# Patient Record
Sex: Female | Born: 1965
Health system: Southern US, Community
[De-identification: ages and names within clinical notes are randomized; demographics above are authoritative.]

## PROBLEM LIST (undated history)

## (undated) DIAGNOSIS — R51 Headache: Secondary | ICD-10-CM

## (undated) DIAGNOSIS — G4730 Sleep apnea, unspecified: Secondary | ICD-10-CM

## (undated) DIAGNOSIS — T7500XA Unspecified effects of lightning, initial encounter: Secondary | ICD-10-CM

## (undated) DIAGNOSIS — M1990 Unspecified osteoarthritis, unspecified site: Secondary | ICD-10-CM

## (undated) DIAGNOSIS — I10 Essential (primary) hypertension: Secondary | ICD-10-CM

## (undated) DIAGNOSIS — R519 Headache, unspecified: Secondary | ICD-10-CM

## (undated) DIAGNOSIS — R112 Nausea with vomiting, unspecified: Secondary | ICD-10-CM

## (undated) DIAGNOSIS — E785 Hyperlipidemia, unspecified: Secondary | ICD-10-CM

## (undated) DIAGNOSIS — E042 Nontoxic multinodular goiter: Secondary | ICD-10-CM

## (undated) DIAGNOSIS — F32A Depression, unspecified: Secondary | ICD-10-CM

## (undated) DIAGNOSIS — H539 Unspecified visual disturbance: Secondary | ICD-10-CM

## (undated) DIAGNOSIS — Z9889 Other specified postprocedural states: Secondary | ICD-10-CM

## (undated) DIAGNOSIS — E669 Obesity, unspecified: Secondary | ICD-10-CM

## (undated) DIAGNOSIS — R002 Palpitations: Secondary | ICD-10-CM

## (undated) DIAGNOSIS — I499 Cardiac arrhythmia, unspecified: Secondary | ICD-10-CM

## (undated) DIAGNOSIS — F329 Major depressive disorder, single episode, unspecified: Secondary | ICD-10-CM

## (undated) DIAGNOSIS — E119 Type 2 diabetes mellitus without complications: Secondary | ICD-10-CM

## (undated) DIAGNOSIS — D179 Benign lipomatous neoplasm, unspecified: Secondary | ICD-10-CM

## (undated) DIAGNOSIS — G510 Bell's palsy: Secondary | ICD-10-CM

## (undated) DIAGNOSIS — I4891 Unspecified atrial fibrillation: Secondary | ICD-10-CM

## (undated) HISTORY — PX: ABDOMINAL HYSTERECTOMY: SHX81

## (undated) HISTORY — DX: Unspecified atrial fibrillation: I48.91

## (undated) HISTORY — DX: Type 2 diabetes mellitus without complications: E11.9

## (undated) HISTORY — DX: Depression, unspecified: F32.A

## (undated) HISTORY — DX: Headache: R51

## (undated) HISTORY — DX: Hyperlipidemia, unspecified: E78.5

## (undated) HISTORY — DX: Unspecified visual disturbance: H53.9

## (undated) HISTORY — DX: Major depressive disorder, single episode, unspecified: F32.9

## (undated) HISTORY — DX: Unspecified osteoarthritis, unspecified site: M19.90

## (undated) HISTORY — DX: Palpitations: R00.2

## (undated) HISTORY — DX: Obesity, unspecified: E66.9

## (undated) HISTORY — DX: Essential (primary) hypertension: I10

## (undated) HISTORY — DX: Headache, unspecified: R51.9

## (undated) HISTORY — DX: Nontoxic multinodular goiter: E04.2

---

## 2001-05-30 ENCOUNTER — Encounter: Admission: RE | Admit: 2001-05-30 | Discharge: 2001-05-30 | Payer: Self-pay | Admitting: Obstetrics and Gynecology

## 2001-05-30 ENCOUNTER — Encounter: Payer: Self-pay | Admitting: Obstetrics and Gynecology

## 2001-08-08 ENCOUNTER — Other Ambulatory Visit: Admission: RE | Admit: 2001-08-08 | Discharge: 2001-08-08 | Payer: Self-pay | Admitting: Obstetrics & Gynecology

## 2002-08-29 ENCOUNTER — Other Ambulatory Visit: Admission: RE | Admit: 2002-08-29 | Discharge: 2002-08-29 | Payer: Self-pay | Admitting: Obstetrics & Gynecology

## 2003-12-07 ENCOUNTER — Ambulatory Visit (HOSPITAL_COMMUNITY): Admission: RE | Admit: 2003-12-07 | Discharge: 2003-12-07 | Payer: Self-pay | Admitting: Obstetrics & Gynecology

## 2004-12-09 ENCOUNTER — Ambulatory Visit (HOSPITAL_COMMUNITY): Admission: RE | Admit: 2004-12-09 | Discharge: 2004-12-09 | Payer: Self-pay | Admitting: Obstetrics & Gynecology

## 2005-01-14 ENCOUNTER — Emergency Department (HOSPITAL_COMMUNITY): Admission: EM | Admit: 2005-01-14 | Discharge: 2005-01-15 | Payer: Self-pay | Admitting: Emergency Medicine

## 2005-06-16 ENCOUNTER — Ambulatory Visit (HOSPITAL_COMMUNITY): Admission: RE | Admit: 2005-06-16 | Discharge: 2005-06-16 | Payer: Self-pay | Admitting: *Deleted

## 2005-08-29 ENCOUNTER — Ambulatory Visit: Payer: Self-pay | Admitting: Internal Medicine

## 2005-12-11 ENCOUNTER — Ambulatory Visit (HOSPITAL_COMMUNITY): Admission: RE | Admit: 2005-12-11 | Discharge: 2005-12-11 | Payer: Self-pay | Admitting: Obstetrics & Gynecology

## 2006-11-08 ENCOUNTER — Ambulatory Visit (HOSPITAL_COMMUNITY): Admission: RE | Admit: 2006-11-08 | Discharge: 2006-11-08 | Payer: Self-pay | Admitting: Obstetrics & Gynecology

## 2006-11-15 ENCOUNTER — Ambulatory Visit (HOSPITAL_COMMUNITY): Admission: RE | Admit: 2006-11-15 | Discharge: 2006-11-15 | Payer: Self-pay | Admitting: General Surgery

## 2006-11-15 ENCOUNTER — Encounter (INDEPENDENT_AMBULATORY_CARE_PROVIDER_SITE_OTHER): Payer: Self-pay | Admitting: Interventional Radiology

## 2007-11-11 ENCOUNTER — Ambulatory Visit (HOSPITAL_COMMUNITY): Admission: RE | Admit: 2007-11-11 | Discharge: 2007-11-11 | Payer: Self-pay | Admitting: Obstetrics & Gynecology

## 2007-11-15 ENCOUNTER — Encounter: Admission: RE | Admit: 2007-11-15 | Discharge: 2007-11-15 | Payer: Self-pay | Admitting: Obstetrics & Gynecology

## 2008-08-20 HISTORY — PX: APPENDECTOMY: SHX54

## 2008-08-21 ENCOUNTER — Observation Stay (HOSPITAL_COMMUNITY): Admission: EM | Admit: 2008-08-21 | Discharge: 2008-08-22 | Payer: Self-pay | Admitting: Emergency Medicine

## 2008-08-21 ENCOUNTER — Encounter: Admission: RE | Admit: 2008-08-21 | Discharge: 2008-08-21 | Payer: Self-pay | Admitting: Family Medicine

## 2008-08-21 ENCOUNTER — Encounter (INDEPENDENT_AMBULATORY_CARE_PROVIDER_SITE_OTHER): Payer: Self-pay | Admitting: Surgery

## 2008-11-16 ENCOUNTER — Ambulatory Visit (HOSPITAL_COMMUNITY): Admission: RE | Admit: 2008-11-16 | Discharge: 2008-11-16 | Payer: Self-pay | Admitting: Obstetrics & Gynecology

## 2009-09-09 ENCOUNTER — Encounter: Admission: RE | Admit: 2009-09-09 | Discharge: 2009-09-09 | Payer: Self-pay | Admitting: General Surgery

## 2009-12-17 ENCOUNTER — Ambulatory Visit (HOSPITAL_BASED_OUTPATIENT_CLINIC_OR_DEPARTMENT_OTHER): Admission: RE | Admit: 2009-12-17 | Discharge: 2009-12-17 | Payer: Self-pay | Admitting: Obstetrics & Gynecology

## 2009-12-17 ENCOUNTER — Ambulatory Visit: Payer: Self-pay | Admitting: Diagnostic Radiology

## 2010-03-13 ENCOUNTER — Encounter: Payer: Self-pay | Admitting: *Deleted

## 2010-03-14 ENCOUNTER — Encounter: Payer: Self-pay | Admitting: General Surgery

## 2010-07-01 ENCOUNTER — Encounter (INDEPENDENT_AMBULATORY_CARE_PROVIDER_SITE_OTHER): Payer: Self-pay | Admitting: General Surgery

## 2010-07-05 NOTE — Op Note (Signed)
NAMEBRAYAH, [name]:  [id number]   MEDICAL RECORD NO.:  [id number]          PATIENT TYPE:  OBV   LOCATION:  0098                         FACILITY:  [facility]   PHYSICIAN:  Wilmon Arms. Corliss Skains, M.D. DATE OF BIRTH:  [birth year]   DATE OF PROCEDURE:  08/21/2008  DATE OF DISCHARGE:                               OPERATIVE REPORT   PREOPERATIVE DIAGNOSIS:  Acute appendicitis.   POSTOPERATIVE DIAGNOSIS:  Acute appendicitis.   PROCEDURE PERFORMED:  Laparoscopic appendectomy.   SURGEON:  Wilmon Arms. Tsuei, M.D.   ANESTHESIA:  General   INDICATIONS:  This is a 45 year old female who presents with a 12-hour  history of lower abdominal pain localized her right lower quadrant.  White count was obtained and was elevated 17.  CT scan showed acute  appendicitis.  No sign of perforation.  She presented to the emergency  department for evaluation.  We recommended immediate appendectomy.   DESCRIPTION OF PROCEDURE:  The patient brought to the operating room,  placed supine position on operating table.  After an adequate level of  general anesthesia was obtained, a Foley catheter was placed under  sterile technique.  The patient's abdomen was prepped with Betadine and  draped in sterile fashion.  A time-out was taken assure proper patient,  proper procedure.  We infiltrated the area just above her umbilicus with  0.25% Marcaine with epinephrine.  We made a transverse incision, and  dissection was carried down to the fascia.  The fascia was opened  vertically, and we entered the peritoneal cavity bluntly.  A stay suture  of 0 Vicryl was placed around the fascial opening.  Pneumoperitoneum was  obtained by insufflating CO2 maintaining maximal pressure of 15 mmHg.  We inserted a laparoscope and did not visualize any free fluid or  purulence.  There were some adhesions in the right upper quadrant.  We  placed a 5-mm port right upper quadrant of the 5 mm portal left lower  quadrant.   Scissors were used take down some adhesions in the right  upper quadrant.  We switched to a 5 mm 30 degree laparoscope and moved  into the right upper quadrant port site.  We positioned the patient in  Trendelenburg tilted to her left.  The cecum was mobilized medially, and  we identified an inflamed nonperforated appendix.  We bluntly dissected  away some of the surrounding inflammatory adhesions.  The mesoappendix  was then divided with the harmonic scalpel.  Once we had dissected all  the way down to the base of the appendix at the cecum, a Endo-GIA  stapler was used to divide the base of appendix.  The staple line was  intact with no sign of bleeding or leak.  The appendix was placed in  EndoCatch sac.  We thoroughly irrigated the right lower quadrant and the  pelvis with saline.  Once we satisfied that hemostasis, we removed the  appendix through the umbilical port site.  The fascia was closed with  the pursestring  suture.  We inspected one last time for hemostasis and then released  pneumoperitoneum as we removed the trocars.  4-0 Monocryl was use close  skin incisions.  Steri-Strips and clean dressings were applied.  The  patient was then extubated, brought to recovery in stable condition.  All sponge, instrument, needle counts were correct.      Wilmon Arms. Tsuei, M.D.  Electronically Signed     MKT/MEDQ  D:  08/21/2008  T:  08/22/2008  Job:  [id number]

## 2010-10-12 ENCOUNTER — Encounter (INDEPENDENT_AMBULATORY_CARE_PROVIDER_SITE_OTHER): Payer: Self-pay | Admitting: General Surgery

## 2010-10-19 ENCOUNTER — Other Ambulatory Visit (HOSPITAL_BASED_OUTPATIENT_CLINIC_OR_DEPARTMENT_OTHER): Payer: Self-pay | Admitting: Obstetrics & Gynecology

## 2010-10-19 DIAGNOSIS — Z1231 Encounter for screening mammogram for malignant neoplasm of breast: Secondary | ICD-10-CM

## 2010-11-17 ENCOUNTER — Telehealth (INDEPENDENT_AMBULATORY_CARE_PROVIDER_SITE_OTHER): Payer: Self-pay | Admitting: General Surgery

## 2010-11-17 NOTE — Telephone Encounter (Signed)
Pt called wanted to confirm if she needed to have an ultrasound of throat done before she makes appt to see Dr. Derrell Lolling. Pt indicated she is LTF and due for appt soon. Has been more than a year since she has been seen by him.

## 2010-11-29 ENCOUNTER — Other Ambulatory Visit (INDEPENDENT_AMBULATORY_CARE_PROVIDER_SITE_OTHER): Payer: Self-pay | Admitting: General Surgery

## 2010-11-29 ENCOUNTER — Telehealth (INDEPENDENT_AMBULATORY_CARE_PROVIDER_SITE_OTHER): Payer: Self-pay | Admitting: General Surgery

## 2010-11-29 DIAGNOSIS — E041 Nontoxic single thyroid nodule: Secondary | ICD-10-CM

## 2010-12-01 ENCOUNTER — Other Ambulatory Visit: Payer: Self-pay

## 2010-12-19 ENCOUNTER — Ambulatory Visit (HOSPITAL_BASED_OUTPATIENT_CLINIC_OR_DEPARTMENT_OTHER)
Admission: RE | Admit: 2010-12-19 | Discharge: 2010-12-19 | Disposition: A | Discharge status: Home / Self Care | Payer: 59 | Source: Ambulatory Visit | Attending: Obstetrics & Gynecology | Admitting: Obstetrics & Gynecology

## 2010-12-19 DIAGNOSIS — Z1231 Encounter for screening mammogram for malignant neoplasm of breast: Secondary | ICD-10-CM | POA: Insufficient documentation

## 2010-12-22 ENCOUNTER — Ambulatory Visit
Admission: RE | Admit: 2010-12-22 | Discharge: 2010-12-22 | Disposition: A | Discharge status: Home / Self Care | Payer: 59 | Source: Ambulatory Visit | Attending: General Surgery | Admitting: General Surgery

## 2010-12-22 DIAGNOSIS — E041 Nontoxic single thyroid nodule: Secondary | ICD-10-CM

## 2011-02-09 ENCOUNTER — Ambulatory Visit (INDEPENDENT_AMBULATORY_CARE_PROVIDER_SITE_OTHER): Payer: 59 | Admitting: General Surgery

## 2011-02-09 ENCOUNTER — Encounter (INDEPENDENT_AMBULATORY_CARE_PROVIDER_SITE_OTHER): Payer: Self-pay | Admitting: General Surgery

## 2011-02-09 VITALS — BP 128/80 | HR 66 | Temp 97.3°F | Resp 18 | Ht 64.0 in | Wt 228.0 lb

## 2011-02-09 DIAGNOSIS — E042 Nontoxic multinodular goiter: Secondary | ICD-10-CM

## 2011-02-09 NOTE — Progress Notes (Signed)
Patient ID: Janet [name], female   DOB: [birth year], 45 y.o.   MRN: [id number]  Chief Complaint  Patient presents with  . Follow-up    Thyroid ultrasound - one year follow up    HPI Janet [name] is a 45 y.o. female.  She is followed annually for a multinodular goiter.. Her primary care physician is Dr. Gertie Gowda in [place].  This patient has been followed since 2008 for multinodular goiter. She was  initially evaluated because she felt some pressure and discomfort in her left neck. She states that has all changed and that she has no neck symptoms whatsoever. No swallowing problems. No voice change.  We did fine needle aspiration on her thyroid nodules in 2008 which were consistent with benign nonneoplastic goiter.  She has no risk factors. No family history and a history of radiation therapy.  Followup ultrasound of the neck was performed on December 22, 2010. This shows multiple bilateral thyroid nodules. There is marginal interval enlargement of the bilateral lower pole dominant nodules. The biggest nodule on the inferior right a 16 x 20 mm. The biggest nodule on the left is 14 x 21 mm. Each of these is about 2 mm larger. HPI  Past Medical History  Diagnosis Date  . Hypertension   . Arthritis     Past Surgical History  Procedure Date  . Appendectomy 7/10    Family History  Problem Relation Age of Onset  . Diabetes Mother   . Hypertension Father   . Hypertension Brother     Social History History  Substance Use Topics  . Smoking status: Never Smoker   . Smokeless tobacco: Never Used  . Alcohol Use: No    No Known Allergies  Current Outpatient Prescriptions  Medication Sig Dispense Refill  . VITAMIN E PO Take by mouth daily.        . BuPROPion HCl (WELLBUTRIN PO) Take by mouth.        . Cholecalciferol (VITAMIN D) 2000 UNITS CAPS Take by mouth.        Marland Kitchen lisinopril (PRINIVIL,ZESTRIL) 10 MG tablet Take 10 mg by mouth daily.        . Omega-3 Fatty Acids  (FISH OIL PO) Take by mouth.          Review of Systems Review of Systems  Constitutional: Negative for fever, chills and unexpected weight change.  HENT: Negative for hearing loss, congestion, sore throat, trouble swallowing and voice change.   Eyes: Negative for visual disturbance.  Respiratory: Negative for cough and wheezing.   Cardiovascular: Negative for chest pain, palpitations and leg swelling.  Gastrointestinal: Negative for nausea, vomiting, abdominal pain, diarrhea, constipation, blood in stool, abdominal distention and anal bleeding.  Genitourinary: Negative for hematuria, vaginal bleeding and difficulty urinating.  Musculoskeletal: Negative for arthralgias.  Skin: Negative for rash and wound.  Neurological: Negative for seizures, syncope and headaches.  Hematological: Negative for adenopathy. Does not bruise/bleed easily.  Psychiatric/Behavioral: Negative for confusion.    Blood pressure 128/80, pulse 66, temperature 97.3 F (36.3 C), temperature source Temporal, resp. rate 18, height 5\' 4"  (1.626 m), weight 228 lb (103.42 kg).  Physical Exam Physical Exam  Constitutional: She appears well-developed and well-nourished. No distress.  HENT:  Head: Normocephalic and atraumatic.  Eyes: Conjunctivae and EOM are normal. Pupils are equal, round, and reactive to light. Left eye exhibits no discharge. No scleral icterus.  Neck: Normal range of motion. Neck supple. No JVD present. No tracheal deviation present. No thyromegaly  present.  Cardiovascular: Normal rate and regular rhythm.   Lymphadenopathy:    She has no cervical adenopathy.  Skin: Skin is warm and dry. No rash noted. She is not diaphoretic. No erythema. No pallor.  Psychiatric: She has a normal mood and affect. Her behavior is normal. Judgment and thought content normal.    Data Reviewed I have reviewed her old chart and her recent ultrasound  Assessment    Benign, nontoxic multinodular goiter. Essentially  stable for 4 years.  Risk of malignancy is very low. There is no indication for surgical intervention.   Plan    The main issue is the frequency of followup. I advised her to followup with her primary care physician.  I told her that she should get a thyroid ultrasound every 2 or 3 years.  Consideration should be given to an endocrinologist consult, although that is not mandatory.  Return to see me p.r.n. further problems       Tawn Fitzner M 02/09/2011, 8:48 AM

## 2011-02-09 NOTE — Patient Instructions (Signed)
I think that you have a benign, nontoxic multinodular goiter. We have followed this for 4 years  and there has been almost no change, only slight enlargement of some of themany nodules. I do not think that this requires surgical intervention. The risk of cancer is very low.  I advise you to followup with your primary care physician. I advise you to get an ultrasound every 2 or 3 years. Discuss with your primary care physician about the possibility of a consultation by an endocrinologist. See me if any new problems arise.

## 2011-02-15 NOTE — Telephone Encounter (Signed)
Results given.

## 2011-04-06 ENCOUNTER — Ambulatory Visit (INDEPENDENT_AMBULATORY_CARE_PROVIDER_SITE_OTHER): Payer: 59 | Admitting: Internal Medicine

## 2011-04-06 ENCOUNTER — Telehealth: Payer: Self-pay

## 2011-04-06 ENCOUNTER — Encounter: Payer: Self-pay | Admitting: Internal Medicine

## 2011-04-06 VITALS — BP 145/88 | HR 86 | Temp 99.2°F | Ht 64.0 in | Wt 229.1 lb

## 2011-04-06 DIAGNOSIS — E785 Hyperlipidemia, unspecified: Secondary | ICD-10-CM | POA: Insufficient documentation

## 2011-04-06 DIAGNOSIS — E669 Obesity, unspecified: Secondary | ICD-10-CM | POA: Insufficient documentation

## 2011-04-06 DIAGNOSIS — R0789 Other chest pain: Secondary | ICD-10-CM

## 2011-04-06 DIAGNOSIS — I10 Essential (primary) hypertension: Secondary | ICD-10-CM | POA: Insufficient documentation

## 2011-04-06 DIAGNOSIS — K649 Unspecified hemorrhoids: Secondary | ICD-10-CM | POA: Insufficient documentation

## 2011-04-06 DIAGNOSIS — F329 Major depressive disorder, single episode, unspecified: Secondary | ICD-10-CM | POA: Insufficient documentation

## 2011-04-06 DIAGNOSIS — E042 Nontoxic multinodular goiter: Secondary | ICD-10-CM

## 2011-04-06 LAB — CBC WITH DIFFERENTIAL/PLATELET
Eosinophils Absolute: 0.1 10*3/uL (ref 0.0–0.7)
Eosinophils Relative: 2 % (ref 0–5)
HCT: 38.4 % (ref 36.0–46.0)
Hemoglobin: 12.4 g/dL (ref 12.0–15.0)
Lymphs Abs: 2 10*3/uL (ref 0.7–4.0)
MCH: 28.6 pg (ref 26.0–34.0)
MCV: 88.5 fL (ref 78.0–100.0)
Monocytes Absolute: 0.6 10*3/uL (ref 0.1–1.0)
Monocytes Relative: 8 % (ref 3–12)
RBC: 4.34 MIL/uL (ref 3.87–5.11)

## 2011-04-06 LAB — COMPREHENSIVE METABOLIC PANEL
Alkaline Phosphatase: 108 U/L (ref 39–117)
Glucose, Bld: 81 mg/dL (ref 70–99)
Sodium: 140 mEq/L (ref 135–145)
Total Bilirubin: 0.2 mg/dL — ABNORMAL LOW (ref 0.3–1.2)
Total Protein: 6.8 g/dL (ref 6.0–8.3)

## 2011-04-06 LAB — LIPID PANEL
Cholesterol: 211 mg/dL — ABNORMAL HIGH (ref 0–200)
LDL Cholesterol: 119 mg/dL — ABNORMAL HIGH (ref 0–99)
Total CHOL/HDL Ratio: 4.1 Ratio
VLDL: 40 mg/dL (ref 0–40)

## 2011-04-06 LAB — TSH: TSH: 0.808 u[IU]/mL (ref 0.350–4.500)

## 2011-04-06 NOTE — Telephone Encounter (Signed)
.  [facility] FROM [facility] STATES THEY ONLY RECEIVED THE EKG ON PT AND THEY NEED THE REST OF THE RECORDS PLEASE FAX TO [phone] AND THE PHONE NUMBER IS [phone]

## 2011-04-06 NOTE — Telephone Encounter (Signed)
This is not our patient. [facility] notified.

## 2011-04-06 NOTE — Patient Instructions (Addendum)
Will refer to cardiology  Dr. Nanetta Batty  Take 81 mg coated ASA every other day  Make appt with therapist Jeannie Done  See me 6 weeks

## 2011-04-06 NOTE — Progress Notes (Signed)
Subjective:    Patient ID: Janet [name], female    DOB: [birth year], 46 y.o.   MRN: [id number]  HPI  New pt here for first visit.  Former care at [facility].  PMH of HTN, obesity, MNG with benign Bx 2008, hyperlipidemia, Bell's palsy,  Adj d/o with depression and hemorrhoids.    Janet [name] is concerned over persistent chest pressure/tightness off and on for several months.  She recently went to cardiac risk screening at [facility] where EKG showed 2 unifocal PVC's and she was advised not to do any exercise or anything exertional. She is trying to lose weight and is not sure if it is safe to exercise now.  She denies SOB, N/V diaphoresis during chest pressure but does note that when she was trying to do push ups, she had L arm pain .  No pain going through to back or indigestion.  Risk factors, HTN, mild hyperlipidemia, obesity,  FH HTN and CVA in grandfather.  No MI that she is aware.    She is under a lot of emotional stress.  Husband left 7 months ago, one teenage daughter is at [employer] in [place] and second daughter at home.  Both girls have not been doing well since father came home over new years.  Grades falling, daughters never been in counseling. Pt was in therapy years ago but not currently. She is on Wellbutrin which helps but she really does not want to add another med now.  She does cry when alone but denies suicidal ideation.    No Known Allergies Past Medical History  Diagnosis Date  . Arthritis   . Depression   . Hypertension   . Hyperlipidemia   . Goiter, nontoxic, multinodular   . Obesity   . Hemorrhoids    Past Surgical History  Procedure Date  . Appendectomy 7/10  . Cesarean section    History   Social History  . Marital Status: Legally Separated    Spouse Name: N/A    Number of Children: N/A  . Years of Education: N/A   Occupational History  . Not on file.   Social History Main Topics  . Smoking status: Never Smoker   . Smokeless tobacco: Never Used  .  Alcohol Use: No  . Drug Use: No  . Sexually Active: Not Currently   Other Topics Concern  . Not on file   Social History Narrative  . No narrative on file   Family History  Problem Relation Age of Onset  . Diabetes Mother   . Hypertension Father   . Hypertension Brother   . Asthma Sister   . COPD Maternal Grandmother   . Diabetes Paternal Grandmother    Patient Active Problem List  Diagnoses  . Depression  . Hypertension  . Hyperlipidemia  . Goiter, nontoxic, multinodular  . Obesity  . Hemorrhoids   No current outpatient prescriptions on file prior to visit.       Review of Systems See HPI    Objective:   Physical Exam Physical Exam  Nursing note and vitals reviewed.  Constitutional: She is oriented to person, place, and time. She appears well-developed and well-nourished.  HENT:  Head: Normocephalic and atraumatic.  Cardiovascular: Normal rate and regular rhythm. Exam reveals no gallop and no friction rub.  No murmur heard.  Pulmonary/Chest: Breath sounds normal. She has no wheezes. She has no rales.  Neurological: She is alert and oriented to person, place, and time.  Skin: Skin is warm and  dry.  Psychiatric: She has a normal mood and affect. Her behavior is normal.         Assessment & Plan:  1)  Persistant chest tightness:  Symptoms somewhat atypical but several risk factors and she would like exercise risk stratification prior to starting an exercise program.  Will refer to Dr. Allyson Sabal.  ADvised for now to take a coated baby ASA qod as she reports when she took one daily she had increased hemorrhoidal bleeding.  She was given education on heart disease in women. 2)  HTN 3)  Hyperlipidemia  Mild on lipid screen from [facility] will recheck today 4) Obesity 5)  Grief ADj/disorder with depressive features:  Pt given number to [facility] therapist and advised to make appt.  Check labs today   See me in 6 weeks

## 2011-04-10 ENCOUNTER — Encounter: Payer: Self-pay | Admitting: Emergency Medicine

## 2011-04-12 ENCOUNTER — Encounter: Payer: Self-pay | Admitting: Internal Medicine

## 2011-04-18 ENCOUNTER — Ambulatory Visit: Payer: 59 | Admitting: Internal Medicine

## 2011-05-04 ENCOUNTER — Encounter: Payer: Self-pay | Admitting: Internal Medicine

## 2011-05-04 DIAGNOSIS — R079 Chest pain, unspecified: Secondary | ICD-10-CM | POA: Insufficient documentation

## 2011-05-09 ENCOUNTER — Ambulatory Visit: Payer: 59 | Admitting: Internal Medicine

## 2011-05-11 ENCOUNTER — Ambulatory Visit: Payer: 59 | Admitting: Internal Medicine

## 2011-05-16 ENCOUNTER — Ambulatory Visit: Payer: 59 | Admitting: Internal Medicine

## 2011-05-16 ENCOUNTER — Encounter: Payer: Self-pay | Admitting: Internal Medicine

## 2011-05-18 ENCOUNTER — Ambulatory Visit (INDEPENDENT_AMBULATORY_CARE_PROVIDER_SITE_OTHER): Payer: 59 | Admitting: Internal Medicine

## 2011-05-18 VITALS — BP 112/73 | HR 90 | Temp 98.0°F | Resp 12 | Ht 64.0 in | Wt 222.1 lb

## 2011-05-18 DIAGNOSIS — R0789 Other chest pain: Secondary | ICD-10-CM

## 2011-05-18 DIAGNOSIS — I10 Essential (primary) hypertension: Secondary | ICD-10-CM

## 2011-05-18 DIAGNOSIS — E042 Nontoxic multinodular goiter: Secondary | ICD-10-CM

## 2011-05-18 MED ORDER — CEPHALEXIN 500 MG PO CAPS: 500.0000 mg | ORAL_CAPSULE | Freq: Two times a day (BID) | ORAL | Status: AC

## 2011-05-18 NOTE — Patient Instructions (Addendum)
Therapist options:   Eudelia Bunch  :  [facility] [phone]                                  Vonna Kotyk:  Phone  [phone]  Will refer to Dr. Talmage Nap  Endocrine specialist  Schedule complete physical exam with me

## 2011-05-18 NOTE — Progress Notes (Signed)
Subjective:    Patient ID: Janet [name], female    DOB: Jul 04, 1965, 46 y.o.   MRN: [id number]  HPI  Janet [name] is here for follow up.  No further chest tightness.  She is off Ace Inhibitor and now on Beta blocker and states she feels better on B blocker.  She did see Dr. [name] see scanned note.    She has not been able to see a therapist as yet. Therapist not in her network.  She has good days and bad days   She states she feels a Pustule in vaginal areas.  She has had these in the past and tries to "pop" area with a pin.  She has been trying to squeeze area  She has never had endocrine eval for her MNG.  Biopsy of thyroid lesion by Dr. Derrell Lolling  Was benign.    No Known Allergies Past Medical History  Diagnosis Date  . Arthritis   . Depression   . Hypertension   . Hyperlipidemia   . Goiter, nontoxic, multinodular   . Obesity   . Hemorrhoids    Past Surgical History  Procedure Date  . Appendectomy 7/10  . Cesarean section    History   Social History  . Marital Status: Legally Separated    Spouse Name: N/A    Number of Children: N/A  . Years of Education: N/A   Occupational History  . Not on file.   Social History Main Topics  . Smoking status: Never Smoker   . Smokeless tobacco: Never Used  . Alcohol Use: No  . Drug Use: No  . Sexually Active: Not Currently   Other Topics Concern  . Not on file   Social History Narrative  . No narrative on file   Family History  Problem Relation Age of Onset  . Diabetes Mother   . Hypertension Father   . Hypertension Brother   . Asthma Sister   . COPD Maternal Grandmother   . Diabetes Paternal Grandmother    Patient Active Problem List  Diagnoses  . Depression  . Hypertension  . Hyperlipidemia  . Goiter, nontoxic, multinodular  . Obesity  . Hemorrhoids  . Chest tightness, discomfort, or pressure   Current Outpatient Prescriptions on File Prior to Visit  Medication Sig Dispense Refill  . buPROPion (WELLBUTRIN  SR) 150 MG 12 hr tablet Take 150 mg by mouth 2 (two) times daily.      . cholecalciferol (VITAMIN D) 1000 UNITS tablet Take 1,000 Units by mouth daily.      Marland Kitchen LORazepam (ATIVAN) 1 MG tablet Take 1 mg by mouth every 8 (eight) hours as needed.      . metoprolol succinate (TOPROL-XL) 25 MG 24 hr tablet Take 1 tablet by mouth Daily.      . vitamin E 400 UNIT capsule Take 400 Units by mouth daily.         Review of Systems See HPI    Objective:   Physical Exam Physical Exam  Nursing note and vitals reviewed.  Constitutional: She is oriented to person, place, and time. She appears well-developed and well-nourished.  HENT:  Head: Normocephalic and atraumatic.  Cardiovascular: Normal rate and regular rhythm. Exam reveals no gallop and no friction rub.  No murmur heard.  Pulmonary/Chest: Breath sounds normal. She has no wheezes. She has no rales. Vaginal exam.  Pt. reports pain in labia minora area on L side.  I do not see any pustule or labial  Lesion  Slight  redness.  Bartholins area appears normal.  No lesion noted on Mons    Neurological: She is alert and oriented to person, place, and time.  Skin: Skin is warm and dry.  Psychiatric: She has a normal mood and affect. Her behavior is normal.       Assessment & Plan:  1)  HTN:  WEll controlled 2)  Grief breavement  I gave numbers to [facility] and Vonna Kotyk for counseling.  Advised to take Wellbutrin twice a day 3)  Labial pain.  Will give empiric Keflex.  If not better pt counseled to call 4)  Multinodular goiter  Will refer to Dr. Talmage Nap    She is to schedule CPE with me

## 2011-06-21 ENCOUNTER — Ambulatory Visit: Payer: 59 | Admitting: Internal Medicine

## 2011-07-03 ENCOUNTER — Encounter: Payer: Self-pay | Admitting: Internal Medicine

## 2011-07-03 ENCOUNTER — Ambulatory Visit (INDEPENDENT_AMBULATORY_CARE_PROVIDER_SITE_OTHER): Payer: 59 | Admitting: Internal Medicine

## 2011-07-03 VITALS — BP 154/90 | HR 68 | Temp 98.3°F | Ht 64.0 in | Wt 229.8 lb

## 2011-07-03 DIAGNOSIS — D259 Leiomyoma of uterus, unspecified: Secondary | ICD-10-CM | POA: Insufficient documentation

## 2011-07-03 DIAGNOSIS — I10 Essential (primary) hypertension: Secondary | ICD-10-CM

## 2011-07-03 DIAGNOSIS — E042 Nontoxic multinodular goiter: Secondary | ICD-10-CM

## 2011-07-03 MED ORDER — LISINOPRIL-HYDROCHLOROTHIAZIDE 10-12.5 MG PO TABS: 1.0000 | ORAL_TABLET | Freq: Every day | ORAL | Status: DC

## 2011-07-03 NOTE — Progress Notes (Signed)
Subjective:    Patient ID: Janet [name], female    DOB: 11-12-1965, 46 y.o.   MRN: [id number]  HPI Janet [name] is here for acute visit.  She describes that back in February she was evalauted by Dr. [name] for atypical chest pain and palpitaitons.  She reports he took her off her lisinopril and HCTZ and placed her on 25 mg of Metoprolol that she has been taking.  She has had her BP taken seeral times since then and it has ranged 148-160 systolic and 87-106 diastolic.  She has been having ffrequent headaches but no chest pain.     She also has seen Dr. [name] and was told she has a large pedunculated fibroid with little change in size.  She would like a new GYn MD as she has to wait so long in their office  She has reschedule her appt with Dr. Talmage Nap  I stressed the importance of keeping appt as she has thyroid nodules that need to be evaluated by an endocrinologist.  No Known Allergies Past Medical History  Diagnosis Date  . Arthritis   . Depression   . Hypertension   . Hyperlipidemia   . Goiter, nontoxic, multinodular   . Obesity   . Hemorrhoids    Past Surgical History  Procedure Date  . Appendectomy 7/10  . Cesarean section    History   Social History  . Marital Status: Legally Separated    Spouse Name: N/A    Number of Children: N/A  . Years of Education: N/A   Occupational History  . Not on file.   Social History Main Topics  . Smoking status: Never Smoker   . Smokeless tobacco: Never Used  . Alcohol Use: No  . Drug Use: No  . Sexually Active: Not Currently   Other Topics Concern  . Not on file   Social History Narrative  . No narrative on file   Family History  Problem Relation Age of Onset  . Diabetes Mother   . Hypertension Father   . Hypertension Brother   . Asthma Sister   . COPD Maternal Grandmother   . Diabetes Paternal Grandmother    Patient Active Problem List  Diagnoses  . Depression  . Hypertension  . Hyperlipidemia  . Goiter, nontoxic,  multinodular  . Obesity  . Hemorrhoids  . Chest tightness, discomfort, or pressure   Current Outpatient Prescriptions on File Prior to Visit  Medication Sig Dispense Refill  . buPROPion (WELLBUTRIN SR) 150 MG 12 hr tablet Take 150 mg by mouth 2 (two) times daily.      . cholecalciferol (VITAMIN D) 1000 UNITS tablet Take 1,000 Units by mouth daily.      Marland Kitchen LORazepam (ATIVAN) 1 MG tablet Take 1 mg by mouth every 8 (eight) hours as needed.      . metoprolol succinate (TOPROL-XL) 25 MG 24 hr tablet Take 1 tablet by mouth Daily.      . vitamin E 400 UNIT capsule Take 400 Units by mouth daily.          Review of Systems    see HPI Objective:   Physical Exam Physical Exam  Nursing note and vitals reviewed.  Constitutional: She is oriented to person, place, and time. She appears well-developed and well-nourished.  HENT:  Head: Normocephalic and atraumatic.  Cardiovascular: Normal rate and regular rhythm. Exam reveals no gallop and no friction rub.  No murmur heard.  Pulmonary/Chest: Breath sounds normal. She has no wheezes. She has  no rales.  Neurological: She is alert and oriented to person, place, and time.  Skin: Skin is warm and dry.  Psychiatric: She has a normal mood and affect. Her behavior is normal.  Ext no edema     Assessment & Plan:  1)  HTN  Will restart Lisinopril/hctz 10/12.5  See me at her next appt in July 2)  Uterine fibroid  I gave numnber to [facility] office pt to call for appt 3)  Thyroid nodules.  Pt states she has rescheduled her appt with Dr. Loura Halt p next appt with me

## 2011-07-03 NOTE — Patient Instructions (Signed)
Keep next appt with me 

## 2011-07-03 NOTE — Progress Notes (Signed)
States here because her BP's have been high, brought a record of bp's- range 140/97 to 160/106 and 154/109. States was on lisinopril 10/12.5 but stopped in February per adice of heart doctor because blood pressures were fine.

## 2011-08-31 ENCOUNTER — Encounter: Payer: Self-pay | Admitting: Internal Medicine

## 2011-08-31 ENCOUNTER — Ambulatory Visit (INDEPENDENT_AMBULATORY_CARE_PROVIDER_SITE_OTHER): Payer: 59 | Admitting: Internal Medicine

## 2011-08-31 VITALS — BP 108/70 | HR 70 | Temp 98.2°F | Resp 16 | Ht 64.0 in | Wt 225.0 lb

## 2011-08-31 DIAGNOSIS — I10 Essential (primary) hypertension: Secondary | ICD-10-CM

## 2011-08-31 DIAGNOSIS — E042 Nontoxic multinodular goiter: Secondary | ICD-10-CM

## 2011-08-31 DIAGNOSIS — Z01419 Encounter for gynecological examination (general) (routine) without abnormal findings: Secondary | ICD-10-CM

## 2011-08-31 DIAGNOSIS — F329 Major depressive disorder, single episode, unspecified: Secondary | ICD-10-CM

## 2011-08-31 DIAGNOSIS — E785 Hyperlipidemia, unspecified: Secondary | ICD-10-CM

## 2011-08-31 DIAGNOSIS — R079 Chest pain, unspecified: Secondary | ICD-10-CM

## 2011-08-31 DIAGNOSIS — Z0000 Encounter for general adult medical examination without abnormal findings: Secondary | ICD-10-CM

## 2011-08-31 DIAGNOSIS — E669 Obesity, unspecified: Secondary | ICD-10-CM

## 2011-08-31 LAB — POCT URINALYSIS DIPSTICK
Glucose, UA: NEGATIVE
Ketones, UA: NEGATIVE
Protein, UA: NEGATIVE
Spec Grav, UA: 1.01

## 2011-08-31 NOTE — Progress Notes (Signed)
Subjective:    Patient ID: Janet [name], female    DOB: October 09, 1965, 46 y.o.   MRN: [id number]  HPI Janet [name] is here for comprhensive eval.   Overall doing well - she has appt with Dr. Talmage Nap next week  Daughter Janet [name] has been more angry this summer at home.  Father has new girlfriend and she found out on Facebook Mother reports her daughter very angry at her father and is stressing over college next year.  Daughter has upcoming appt with therapist Janet [name] reports she has noticed a delay with certain foods when she swallows, Eggs, french fries seem to be getting stuck.  She has occasional heartburn as well.    Has been trying to follow Dash diet.  She has lost 5 lbs since February  No further chest tightness.  She was evaluated by Dr. [name]  No Known Allergies Past Medical History  Diagnosis Date  . Arthritis   . Depression   . Hypertension   . Hyperlipidemia   . Goiter, nontoxic, multinodular   . Obesity   . Hemorrhoids    Past Surgical History  Procedure Date  . Appendectomy 7/10  . Cesarean section    History   Social History  . Marital Status: Legally Separated    Spouse Name: N/A    Number of Children: N/A  . Years of Education: N/A   Occupational History  . Not on file.   Social History Main Topics  . Smoking status: Never Smoker   . Smokeless tobacco: Never Used  . Alcohol Use: No  . Drug Use: No  . Sexually Active: Not Currently   Other Topics Concern  . Not on file   Social History Narrative  . No narrative on file   Family History  Problem Relation Age of Onset  . Diabetes Mother   . Hypertension Father   . Hypertension Brother   . Asthma Sister   . COPD Maternal Grandmother   . Diabetes Paternal Grandmother    Patient Active Problem List  Diagnosis  . Depression  . Hypertension  . Hyperlipidemia  . Goiter, nontoxic, multinodular  . Obesity  . Hemorrhoids  . Chest tightness, discomfort, or pressure  . Uterine fibroid     Current Outpatient Prescriptions on File Prior to Visit  Medication Sig Dispense Refill  . buPROPion (WELLBUTRIN SR) 150 MG 12 hr tablet Take 150 mg by mouth 2 (two) times daily.      . cholecalciferol (VITAMIN D) 1000 UNITS tablet Take 1,000 Units by mouth daily.      Marland Kitchen lisinopril-hydrochlorothiazide (PRINZIDE,ZESTORETIC) 10-12.5 MG per tablet Take 1 tablet by mouth daily.  90 tablet  3  . metoprolol succinate (TOPROL-XL) 25 MG 24 hr tablet Take 1 tablet by mouth Daily.      . vitamin E 400 UNIT capsule Take 400 Units by mouth daily.      Marland Kitchen LORazepam (ATIVAN) 1 MG tablet Take 1 mg by mouth every 8 (eight) hours as needed.           Review of Systems  Constitutional: Negative.   HENT: Negative.   Eyes: Negative.   Respiratory: Negative.   Cardiovascular: Negative.   Gastrointestinal: Negative.   Genitourinary: Negative.   Musculoskeletal: Negative.   Skin: Negative.   Neurological: Negative.   Hematological: Negative.   Psychiatric/Behavioral: Negative.        Objective:   Physical Exam Physical Exam  Nursing note and vitals reviewed.  Constitutional: She is  oriented to person, place, and time. She appears well-developed and well-nourished.  HENT:  Head: Normocephalic and atraumatic.  Right Ear: Tympanic membrane and ear canal normal. No drainage. Tympanic membrane is not injected and not erythematous.  Left Ear: Tympanic membrane and ear canal normal. No drainage. Tympanic membrane is not injected and not erythematous.  Nose: Nose normal. Right sinus exhibits no maxillary sinus tenderness and no frontal sinus tenderness. Left sinus exhibits no maxillary sinus tenderness and no frontal sinus tenderness.  Mouth/Throat: Oropharynx is clear and moist. No oral lesions. No oropharyngeal exudate.  Eyes: Conjunctivae and EOM are normal. Pupils are equal, round, and reactive to light.  Neck: Normal range of motion. Neck supple. No JVD present. Carotid bruit is not present. No  mass and no thyromegaly present.  Cardiovascular: Normal rate, regular rhythm, S1 normal, S2 normal and intact distal pulses. Exam reveals no gallop and no friction rub.  No murmur heard.  Pulses:  Carotid pulses are 2+ on the right side, and 2+ on the left side.  Dorsalis pedis pulses are 2+ on the right side, and 2+ on the left side.  No carotid bruit. No LE edema  Pulmonary/Chest: Breath sounds normal. She has no wheezes. She has no rales. She exhibits no tenderness.  Breast no discrete mass no nipple discharge, no axillary adenopathy bilaterally Abdominal: Soft. Bowel sounds are normal. She exhibits no distension and no mass. There is no hepatosplenomegaly. There is no tenderness. There is no CVA tenderness.  Musculoskeletal: Normal range of motion.  No active synovitis to joints.  Lymphadenopathy:  She has no cervical adenopathy.  She has no axillary adenopathy.  Right: No inguinal and no supraclavicular adenopathy present.  Left: No inguinal and no supraclavicular adenopathy present.  Neurological: She is alert and oriented to person, place, and time. She has normal strength and normal reflexes. She displays no tremor. No cranial nerve deficit or sensory deficit. Coordination and gait normal.  Skin: Skin is warm and dry. No rash noted. No cyanosis. Nails show no clubbing.  Psychiatric: She has a normal mood and affect. Her speech is normal and behavior is normal. Cognition and memory are normal.           Assessment & Plan:  Helath Maintenance  See scanned sheet.  Pap done Dr. [name].  She knows she is due for colonoscopy with Dr. [name] and states she will shcedule appt this year.    Dysphagia  Advised I would like to get Barium swallow but pt declines now.  She would like to try PPI daily and if not better will get X-ray.  See me in 3 mlonths.  She voices understanding of return appt.   HTN;  Well controlled  Situational depression  Well controlled  Hyperlipidemia mild   Following DASH diet  Obesity offered dietician consult but she states she would like to try weight watchers   I spent 45 minutes with pt.

## 2011-08-31 NOTE — Patient Instructions (Addendum)
See me in 3 months

## 2011-09-01 LAB — TSH: TSH: 0.897 u[IU]/mL (ref 0.350–4.500)

## 2011-09-01 LAB — VITAMIN D 25 HYDROXY (VIT D DEFICIENCY, FRACTURES): Vit D, 25-Hydroxy: 63 ng/mL (ref 30–89)

## 2011-09-04 ENCOUNTER — Telehealth: Payer: Self-pay | Admitting: *Deleted

## 2011-09-04 NOTE — Telephone Encounter (Signed)
Copy of labs mailed to pt's home address per Dr Constance Goltz.

## 2011-09-06 ENCOUNTER — Telehealth: Payer: Self-pay | Admitting: Internal Medicine

## 2011-09-06 NOTE — Telephone Encounter (Signed)
Pt called in saying her daughters were sick and needed to be seen... Advise that dds was out of town on conference... Dr z was booked... She was advise to go to the Urgent care in [place]... Ad

## 2011-09-07 ENCOUNTER — Other Ambulatory Visit: Payer: Self-pay | Admitting: Endocrinology

## 2011-09-07 DIAGNOSIS — E041 Nontoxic single thyroid nodule: Secondary | ICD-10-CM

## 2011-09-25 ENCOUNTER — Other Ambulatory Visit: Payer: Self-pay | Admitting: *Deleted

## 2011-09-25 DIAGNOSIS — F329 Major depressive disorder, single episode, unspecified: Secondary | ICD-10-CM

## 2011-09-25 MED ORDER — BUPROPION HCL ER (SR) 150 MG PO TB12: 150.0000 mg | ORAL_TABLET | Freq: Two times a day (BID) | ORAL | Status: DC

## 2011-09-25 NOTE — Telephone Encounter (Signed)
[employer] faxed over authorization for pt to get Wellbutrin by mail order.  Requested a 90 days supply granted.

## 2011-09-25 NOTE — Telephone Encounter (Signed)
Mervyn Gay   Can you call this in?

## 2011-09-25 NOTE — Telephone Encounter (Signed)
Janet [name]   Can you call these in .  Apparently there was a problem with e-scribe

## 2011-09-26 NOTE — Telephone Encounter (Signed)
Spoke with [employer] this morning and the Wellbutrin RX was received and has been sent out to the pt.

## 2011-11-15 ENCOUNTER — Other Ambulatory Visit: Payer: Self-pay | Admitting: Internal Medicine

## 2011-11-15 DIAGNOSIS — Z1231 Encounter for screening mammogram for malignant neoplasm of breast: Secondary | ICD-10-CM

## 2011-11-16 ENCOUNTER — Telehealth: Payer: Self-pay | Admitting: *Deleted

## 2011-11-16 MED ORDER — METOPROLOL SUCCINATE ER 25 MG PO TB24: 25.0000 mg | ORAL_TABLET | Freq: Every day | ORAL | Status: DC

## 2011-11-16 NOTE — Telephone Encounter (Signed)
Pt would like refills of metoprolol pt recently changed pharmacies pt requesting #[phone] refills. I will call in 14 tablets to [facility] until rx is delivered

## 2011-11-30 ENCOUNTER — Encounter: Payer: Self-pay | Admitting: Internal Medicine

## 2011-11-30 ENCOUNTER — Ambulatory Visit (INDEPENDENT_AMBULATORY_CARE_PROVIDER_SITE_OTHER): Payer: 59 | Admitting: Internal Medicine

## 2011-11-30 VITALS — BP 111/77 | HR 77 | Temp 97.6°F | Resp 18 | Wt 227.8 lb

## 2011-11-30 DIAGNOSIS — F329 Major depressive disorder, single episode, unspecified: Secondary | ICD-10-CM

## 2011-11-30 DIAGNOSIS — I10 Essential (primary) hypertension: Secondary | ICD-10-CM

## 2011-11-30 MED ORDER — SERTRALINE HCL 50 MG PO TABS: ORAL_TABLET | ORAL | Status: DC

## 2011-11-30 MED ORDER — LORAZEPAM 0.5 MG PO TABS: ORAL_TABLET | ORAL | Status: DC

## 2011-11-30 NOTE — Progress Notes (Signed)
Subjective:    Patient ID: Janet [name], female    DOB: [birth year], 46 y.o.   MRN: [id number]  HPI  Janet [name] is here for follow up.   She is tearful and crying most of interview.  She states her depression is worse, she is crying most days "I don't know why"  Difficult concentrating at work,  Trouble sleeping, depressed mood.    She has a history of postpartum depression,  Strong FH of depression in sister and mother.    She has not seen a counselor as I counslede in the past because they are not in her network.  She states she is going to [facility] today and she knows they are in her network  She denies S/H ideation.  No psychotic features  She states she has not had any trouble swallowing but she has not tried prilosec   No Known Allergies Past Medical History  Diagnosis Date  . Arthritis   . Depression   . Hypertension   . Hyperlipidemia   . Goiter, nontoxic, multinodular   . Obesity   . Hemorrhoids    Past Surgical History  Procedure Date  . Appendectomy 7/10  . Cesarean section    History   Social History  . Marital Status: Legally Separated    Spouse Name: N/A    Number of Children: N/A  . Years of Education: N/A   Occupational History  . Not on file.   Social History Main Topics  . Smoking status: Never Smoker   . Smokeless tobacco: Never Used  . Alcohol Use: No  . Drug Use: No  . Sexually Active: Not Currently   Other Topics Concern  . Not on file   Social History Narrative  . No narrative on file   Family History  Problem Relation Age of Onset  . Diabetes Mother   . Hypertension Father   . Hypertension Brother   . Asthma Sister   . COPD Maternal Grandmother   . Diabetes Paternal Grandmother    Patient Active Problem List  Diagnosis  . Depression  . Hypertension  . Hyperlipidemia  . Goiter, nontoxic, multinodular  . Obesity  . Hemorrhoids  . Chest pain  . Uterine fibroid   Current Outpatient Prescriptions on File  Prior to Visit  Medication Sig Dispense Refill  . buPROPion (WELLBUTRIN SR) 150 MG 12 hr tablet Take 1 tablet (150 mg total) by mouth 2 (two) times daily.  180 tablet  0  . cholecalciferol (VITAMIN D) 1000 UNITS tablet Take 1,000 Units by mouth daily.      . metoprolol succinate (TOPROL-XL) 25 MG 24 hr tablet Take 1 tablet (25 mg total) by mouth daily.  90 tablet  3  . vitamin E 400 UNIT capsule Take 400 Units by mouth daily.      Marland Kitchen lisinopril-hydrochlorothiazide (PRINZIDE,ZESTORETIC) 10-12.5 MG per tablet Take 1 tablet by mouth daily.  90 tablet  3  . sertraline (ZOLOFT) 50 MG tablet Take one tablet nightly with food for 7 days then 2 tablets nightly  60 tablet  1      Review of Systems See HPI    Objective:   Physical Exam Physical Exam  Nursing note and vitals reviewed. Crying most of interview Constitutional: She is oriented to person, place, and time. She appears well-developed and well-nourished.  HENT:  Head: Normocephalic and atraumatic.  Cardiovascular: Normal rate and regular rhythm. Exam reveals no gallop and no friction rub.  No murmur heard.  Pulmonary/Chest: Breath sounds normal. She has no wheezes. She has no rales.  Neurological: She is alert and oriented to person, place, and time.  Skin: Skin is warm and dry.  Psychiatric: Sad flat affect             Assessment & Plan:  Depression  Will add Zoloft 50 mg for 7 days then 100 mg daily.  Pt counseled to see me in office if worsening symptoms.   If suicidal thoughts  She is to go to ER of choice.  Pt voices understanding.    I counseled her to make appt with therapist of choice at [facility] when she goes to their office today.  She voices understannig  Dysphagia  Improved.  She is gaining weight

## 2011-12-01 ENCOUNTER — Telehealth: Payer: Self-pay | Admitting: *Deleted

## 2011-12-01 NOTE — Telephone Encounter (Signed)
Pt with several episodes of intermittent  sharp right sided chest pain that woke her this am pt states that she went to UC last PM for same sx and an EKG was performed and normal. Pt denies SOB or N/V. Advised pt to be seen in the ER. Dr Constance Goltz made aware of pt sx

## 2011-12-20 ENCOUNTER — Ambulatory Visit (HOSPITAL_BASED_OUTPATIENT_CLINIC_OR_DEPARTMENT_OTHER)
Admission: RE | Admit: 2011-12-20 | Discharge: 2011-12-20 | Disposition: A | Discharge status: Home / Self Care | Payer: 59 | Source: Ambulatory Visit | Attending: Internal Medicine | Admitting: Internal Medicine

## 2011-12-20 DIAGNOSIS — Z1231 Encounter for screening mammogram for malignant neoplasm of breast: Secondary | ICD-10-CM | POA: Insufficient documentation

## 2012-02-01 ENCOUNTER — Ambulatory Visit (INDEPENDENT_AMBULATORY_CARE_PROVIDER_SITE_OTHER): Payer: 59 | Admitting: Internal Medicine

## 2012-02-01 ENCOUNTER — Encounter: Payer: Self-pay | Admitting: Internal Medicine

## 2012-02-01 VITALS — BP 113/65 | HR 76 | Temp 98.6°F | Resp 18 | Wt 227.0 lb

## 2012-02-01 DIAGNOSIS — F329 Major depressive disorder, single episode, unspecified: Secondary | ICD-10-CM

## 2012-02-01 DIAGNOSIS — R079 Chest pain, unspecified: Secondary | ICD-10-CM

## 2012-02-01 DIAGNOSIS — F3289 Other specified depressive episodes: Secondary | ICD-10-CM

## 2012-02-01 DIAGNOSIS — I10 Essential (primary) hypertension: Secondary | ICD-10-CM

## 2012-02-01 MED ORDER — BUPROPION HCL ER (SR) 150 MG PO TB12: ORAL_TABLET | ORAL | Status: DC

## 2012-02-01 MED ORDER — SERTRALINE HCL 50 MG PO TABS: ORAL_TABLET | ORAL | Status: DC

## 2012-02-01 NOTE — Patient Instructions (Addendum)
See me as needed 

## 2012-02-01 NOTE — Progress Notes (Signed)
Subjective:    Patient ID: Janet [name], female    DOB: 06-May-1965, 46 y.o.   MRN: [id number]  HPI  Janet [name] is here for follow up.  She has started Zoloft in addition to her Wellbutrin  She is doing much better . Greatly improved mood with Zoloft 100mg  and Wellbutrin 150 mg.   Physical symptoms of chest pressure are gone.  She has not started with counselor yet as she wants to have Marissa start with a counselor  No S/H ideation  No Known Allergies Past Medical History  Diagnosis Date  . Arthritis   . Depression   . Hypertension   . Hyperlipidemia   . Goiter, nontoxic, multinodular   . Obesity   . Hemorrhoids    Past Surgical History  Procedure Date  . Appendectomy 7/10  . Cesarean section    History   Social History  . Marital Status: Legally Separated    Spouse Name: N/A    Number of Children: N/A  . Years of Education: N/A   Occupational History  . Not on file.   Social History Main Topics  . Smoking status: Never Smoker   . Smokeless tobacco: Never Used  . Alcohol Use: No  . Drug Use: No  . Sexually Active: Not Currently   Other Topics Concern  . Not on file   Social History Narrative  . No narrative on file   Family History  Problem Relation Age of Onset  . Diabetes Mother   . Hypertension Father   . Hypertension Brother   . Asthma Sister   . COPD Maternal Grandmother   . Diabetes Paternal Grandmother    Patient Active Problem List  Diagnosis  . Depression  . Hypertension  . Hyperlipidemia  . Goiter, nontoxic, multinodular  . Obesity  . Hemorrhoids  . Chest pain  . Uterine fibroid   Current Outpatient Prescriptions on File Prior to Visit  Medication Sig Dispense Refill  . buPROPion (WELLBUTRIN SR) 150 MG 12 hr tablet Take 1 tablet (150 mg total) by mouth 2 (two) times daily.  180 tablet  0  . cholecalciferol (VITAMIN D) 1000 UNITS tablet Take 1,000 Units by mouth daily.      Marland Kitchen lisinopril-hydrochlorothiazide (PRINZIDE,ZESTORETIC)  10-12.5 MG per tablet Take 1 tablet by mouth daily.  90 tablet  3  . metoprolol succinate (TOPROL-XL) 25 MG 24 hr tablet Take 1 tablet (25 mg total) by mouth daily.  90 tablet  3  . sertraline (ZOLOFT) 50 MG tablet Take one tablet nightly with food for 7 days then 2 tablets nightly  60 tablet  1  . vitamin E 400 UNIT capsule Take 400 Units by mouth daily.      Marland Kitchen LORazepam (ATIVAN) 0.5 MG tablet Take one tablet twice a week hs  10 tablet  0     Review of Systems See HPI    Objective:   Physical Exam  Physical Exam  Nursing note and vitals reviewed.  Constitutional: She is oriented to person, place, and time. She appears well-developed and well-nourished.  HENT:  Head: Normocephalic and atraumatic.  Cardiovascular: Normal rate and regular rhythm. Exam reveals no gallop and no friction rub.  No murmur heard.  Pulmonary/Chest: Breath sounds normal. She has no wheezes. She has no rales.  Neurological: She is alert and oriented to person, place, and time.  Skin: Skin is warm and dry.  Psychiatric: She has a normal mood and affect. Her behavior is normal.  Ext no  edema        Assessment & Plan:  1) Depression  Improved.  Continue Zoloft 100 mg and Wellbutrin 150 mg  XR  Chest pain  Resolved for now  HTN:  Continue current meds

## 2012-02-22 ENCOUNTER — Other Ambulatory Visit: Payer: Self-pay | Admitting: *Deleted

## 2012-02-22 NOTE — Telephone Encounter (Signed)
Refill request

## 2012-02-23 MED ORDER — SERTRALINE HCL 50 MG PO TABS: ORAL_TABLET | ORAL | Status: DC

## 2012-02-26 ENCOUNTER — Other Ambulatory Visit: Payer: Self-pay | Admitting: *Deleted

## 2012-02-26 ENCOUNTER — Ambulatory Visit
Admission: RE | Admit: 2012-02-26 | Discharge: 2012-02-26 | Disposition: A | Discharge status: Home / Self Care | Payer: 59 | Source: Ambulatory Visit | Attending: Endocrinology | Admitting: Endocrinology

## 2012-02-26 DIAGNOSIS — E041 Nontoxic single thyroid nodule: Secondary | ICD-10-CM

## 2012-02-26 NOTE — Telephone Encounter (Signed)
PT states she called her [facility] and they did not recd the prescription to her Zoloft.. Pt was informed that it was done.. Pt advised that information will be forward to nurse to see what is going on, but ensure her that prescription has been sent.Marland KitchenMarland Kitchen

## 2012-02-26 NOTE — Telephone Encounter (Signed)
Called [facility] pharmacy to refill zoloft

## 2012-02-27 ENCOUNTER — Telehealth: Payer: Self-pay | Admitting: Internal Medicine

## 2012-02-27 NOTE — Telephone Encounter (Signed)
Pt return phone call from yesterday... Pt request to call her at work [phone] ext [phone]... Ad

## 2012-03-04 ENCOUNTER — Other Ambulatory Visit: Payer: Self-pay | Admitting: *Deleted

## 2012-03-05 ENCOUNTER — Telehealth: Payer: Self-pay | Admitting: *Deleted

## 2012-03-07 NOTE — Telephone Encounter (Signed)
Verified appt date

## 2012-04-15 ENCOUNTER — Other Ambulatory Visit: Payer: Self-pay | Admitting: *Deleted

## 2012-04-15 MED ORDER — LISINOPRIL-HYDROCHLOROTHIAZIDE 10-12.5 MG PO TABS: 1.0000 | ORAL_TABLET | Freq: Every day | ORAL | Status: DC

## 2012-04-15 MED ORDER — LISINOPRIL-HYDROCHLOROTHIAZIDE 10-12.5 MG PO TABS: 1.0000 | ORAL_TABLET | Freq: Every day | ORAL | Status: AC

## 2012-04-15 NOTE — Telephone Encounter (Signed)
Refill request

## 2012-04-15 NOTE — Telephone Encounter (Signed)
Refill request. I also faxed  The new RX form to [facility]

## 2012-06-13 ENCOUNTER — Ambulatory Visit (INDEPENDENT_AMBULATORY_CARE_PROVIDER_SITE_OTHER): Payer: 59

## 2012-06-13 ENCOUNTER — Ambulatory Visit (INDEPENDENT_AMBULATORY_CARE_PROVIDER_SITE_OTHER): Payer: 59 | Admitting: Internal Medicine

## 2012-06-13 ENCOUNTER — Telehealth: Payer: Self-pay | Admitting: Internal Medicine

## 2012-06-13 ENCOUNTER — Telehealth: Payer: Self-pay | Admitting: *Deleted

## 2012-06-13 ENCOUNTER — Encounter: Payer: Self-pay | Admitting: Internal Medicine

## 2012-06-13 VITALS — BP 116/76 | HR 67 | Temp 97.5°F | Resp 18 | Wt 229.0 lb

## 2012-06-13 DIAGNOSIS — M7530 Calcific tendinitis of unspecified shoulder: Secondary | ICD-10-CM

## 2012-06-13 DIAGNOSIS — E042 Nontoxic multinodular goiter: Secondary | ICD-10-CM

## 2012-06-13 DIAGNOSIS — M21619 Bunion of unspecified foot: Secondary | ICD-10-CM

## 2012-06-13 DIAGNOSIS — M2011 Hallux valgus (acquired), right foot: Secondary | ICD-10-CM

## 2012-06-13 DIAGNOSIS — M25522 Pain in left elbow: Secondary | ICD-10-CM

## 2012-06-13 DIAGNOSIS — M25512 Pain in left shoulder: Secondary | ICD-10-CM | POA: Insufficient documentation

## 2012-06-13 DIAGNOSIS — M25519 Pain in unspecified shoulder: Secondary | ICD-10-CM

## 2012-06-13 DIAGNOSIS — M21611 Bunion of right foot: Secondary | ICD-10-CM

## 2012-06-13 LAB — CBC WITH DIFFERENTIAL/PLATELET
Basophils Absolute: 0.1 10*3/uL (ref 0.0–0.1)
Basophils Relative: 1 % (ref 0–1)
HCT: 36.5 % (ref 36.0–46.0)
MCHC: 33.4 g/dL (ref 30.0–36.0)
Monocytes Absolute: 0.4 10*3/uL (ref 0.1–1.0)
Neutro Abs: 5.5 10*3/uL (ref 1.7–7.7)
Neutrophils Relative %: 72 % (ref 43–77)
RDW: 14.6 % (ref 11.5–15.5)

## 2012-06-13 LAB — COMPREHENSIVE METABOLIC PANEL
AST: 20 U/L (ref 0–37)
Albumin: 3.9 g/dL (ref 3.5–5.2)
Alkaline Phosphatase: 106 U/L (ref 39–117)
BUN: 13 mg/dL (ref 6–23)
Potassium: 4.5 mEq/L (ref 3.5–5.3)

## 2012-06-13 NOTE — Patient Instructions (Addendum)
See me next week 

## 2012-06-13 NOTE — Telephone Encounter (Signed)
Notified pt that her insurance will not pay for her CT but will pay for a plain film

## 2012-06-13 NOTE — Telephone Encounter (Signed)
Janet [name]  Call pt and inform her that her insurance will not pay for a CT of her shoulder.  Tell her that I will order a plain xray of her L shoulder and keep her appt with me next week.    Also she has seen Dr. Talmage Nap in the past  ,  I do not see a note from her office.  Please call her office and get last few office notes  thanks

## 2012-06-13 NOTE — Telephone Encounter (Signed)
Pt called back with appt scheduled with Dr Raynald Kemp.  Scheduled Friday Jun 21, 2012 at 11:15 am.

## 2012-06-13 NOTE — Progress Notes (Signed)
Subjective:    Patient ID: Janet [name], female    DOB: [birth year], 47 y.o.   MRN: [id number]  HPI Kalleigh is here for acute visit.  L arm pain 2 months along muscle of arm.  She denies joint redness or edema.  Larita Fife has a longstanding growth anterior surface of her L shoulder. "It seems to have shifted"  She takes an occasional alleve.  Pain worse when she does push ups but denies injury or trauma.   R great toe swollen at joint no redness or swelling.   She does have some "shooting pain of her R great toe".  No redness at joint.    Carisa tells me she did see dr. Talmage Nap regarding her thyroid nodules No Known Allergies Past Medical History  Diagnosis Date  . Arthritis   . Depression   . Hypertension   . Hyperlipidemia   . Goiter, nontoxic, multinodular   . Obesity   . Hemorrhoids    Past Surgical History  Procedure Laterality Date  . Appendectomy  7/10  . Cesarean section     History   Social History  . Marital Status: Legally Separated    Spouse Name: N/A    Number of Children: N/A  . Years of Education: N/A   Occupational History  . Not on file.   Social History Main Topics  . Smoking status: Never Smoker   . Smokeless tobacco: Never Used  . Alcohol Use: No  . Drug Use: No  . Sexually Active: Not Currently   Other Topics Concern  . Not on file   Social History Narrative  . No narrative on file   Family History  Problem Relation Age of Onset  . Diabetes Mother   . Hypertension Father   . Hypertension Brother   . Asthma Sister   . COPD Maternal Grandmother   . Diabetes Paternal Grandmother    Patient Active Problem List  Diagnosis  . Depression  . Hypertension  . Hyperlipidemia  . Goiter, nontoxic, multinodular  . Obesity  . Hemorrhoids  . Chest pain  . Uterine fibroid  . Left shoulder pain  . Bunion of great toe   Current Outpatient Prescriptions on File Prior to Visit  Medication Sig Dispense Refill  . buPROPion (WELLBUTRIN SR) 150 MG 12  hr tablet Take one tablet daily  90 tablet  1  . cholecalciferol (VITAMIN D) 1000 UNITS tablet Take 1,000 Units by mouth daily.      Marland Kitchen lisinopril-hydrochlorothiazide (PRINZIDE,ZESTORETIC) 10-12.5 MG per tablet Take 1 tablet by mouth daily.  90 tablet  3  . metoprolol succinate (TOPROL-XL) 25 MG 24 hr tablet Take 1 tablet (25 mg total) by mouth daily.  90 tablet  3  . sertraline (ZOLOFT) 50 MG tablet Take one tablet nightly with food for 7 days then 2 tablets nightly  180 tablet  3  . vitamin E 400 UNIT capsule Take 400 Units by mouth daily.      Marland Kitchen LORazepam (ATIVAN) 0.5 MG tablet Take one tablet twice a week hs  10 tablet  0   No current facility-administered medications on file prior to visit.        Review of Systems See HPI    Objective:   Physical Exam Physical Exam  Nursing note and vitals reviewed.  Constitutional: She is oriented to person, place, and time. She appears well-developed and well-nourished.  HENT:  Head: Normocephalic and atraumatic.  Cardiovascular: Normal rate and regular rhythm. Exam reveals no  gallop and no friction rub.  No murmur heard.  Pulmonary/Chest: Breath sounds normal. She has no wheezes. She has no rales.  Neurological: She is alert and oriented to person, place, and time. M/S  Larita Fife has what appear to be a large lipomatous like mass anterior to L shoulder  NO pain upon ROM to any joint in L arm.  No synovitis  No pain to palpation of muscle belly R great toe  No synovitis  ?? bunion  Skin: Skin is warm and dry.  Psychiatric: She has a normal mood and affect. Her behavior is normal.             Assessment & Plan:  Pain L arm and ?? Lipomatous Mass  Insurance will not pay for CT will discuss plain film with pt.  Ok for Advil 600 mg bid with food  See me next week  R great toe  ?? Bunion  Will refer to podiatry  See me next week

## 2012-06-14 LAB — RHEUMATOID FACTOR: Rhuematoid fact SerPl-aCnc: <10 IU/mL (ref <=14)

## 2012-06-14 LAB — C-REACTIVE PROTEIN: CRP: 1.8 mg/dL — ABNORMAL HIGH (ref <0.60)

## 2012-06-17 ENCOUNTER — Encounter: Payer: Self-pay | Admitting: *Deleted

## 2012-06-18 ENCOUNTER — Telehealth: Payer: Self-pay | Admitting: Internal Medicine

## 2012-06-18 DIAGNOSIS — M7542 Impingement syndrome of left shoulder: Secondary | ICD-10-CM

## 2012-06-18 NOTE — Telephone Encounter (Signed)
Spoke with pt and informed of xray results.    Will refer to Dr. Pearletha Forge for further eval and treatment

## 2012-06-19 ENCOUNTER — Encounter: Payer: Self-pay | Admitting: *Deleted

## 2012-06-20 ENCOUNTER — Ambulatory Visit: Payer: 59 | Admitting: Internal Medicine

## 2012-06-21 ENCOUNTER — Encounter: Payer: Self-pay | Admitting: Podiatry

## 2012-06-21 ENCOUNTER — Ambulatory Visit (INDEPENDENT_AMBULATORY_CARE_PROVIDER_SITE_OTHER): Payer: Managed Care, Other (non HMO) | Admitting: Podiatry

## 2012-06-21 VITALS — BP 120/73 | HR 62 | Ht 64.0 in | Wt 230.0 lb

## 2012-06-21 DIAGNOSIS — M25571 Pain in right ankle and joints of right foot: Secondary | ICD-10-CM

## 2012-06-21 DIAGNOSIS — M25579 Pain in unspecified ankle and joints of unspecified foot: Secondary | ICD-10-CM

## 2012-06-21 DIAGNOSIS — M1990 Unspecified osteoarthritis, unspecified site: Secondary | ICD-10-CM

## 2012-06-21 DIAGNOSIS — M21619 Bunion of unspecified foot: Secondary | ICD-10-CM

## 2012-06-21 DIAGNOSIS — M21611 Bunion of right foot: Secondary | ICD-10-CM

## 2012-06-21 NOTE — Progress Notes (Signed)
  SUBJECTIVE: 47 y.o. year old female presents complaining of pain on right foot bunion area over 5 years.  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  Patient was referred by Dr. Ebbie Latus.   REVIEW OF SYSTEMS: Constitutional: negative for anorexia, chills, fatigue, fevers, malaise, night sweats, sweats and weight loss Ears, nose, mouth, throat, and face: negative Respiratory: negative Cardiovascular: Has extra beat and takes medicaiton. Gastrointestinal: negative Musculoskeletal:Only foot pain. Neurological: negative Endocrine: negative  OBJECTIVE: DERMATOLOGIC EXAMINATION: Skin and Nails are normal appearance.   VASCULAR EXAMINATION OF LOWER LIMBS: Pedal pulses: All pedal pulses are palpable with normal pulsation.  Capillary Filling times within 3 seconds in all digits.  Edema or ischemic changes absent.  Temperature gradient from tibial crest to dorsum of foot is within normal bilateral.  NEUROLOGIC EXAMINATION OF THE LOWER LIMBS: Achilles DTR is present and within normal. Epicritic and tactile sensations grossly normal.   MUSCULOSKELETAL EXAMINATION: Left: Rectus foot with weak and hypermobile first Tarsometatarsal joint.  Limited hallux dorsiflexion when forefoot is loaded.  Right: Enlarged dorsomedial aspect of the right first Metatarsal head. Positive change noted of excess motion at the first Tarsometatarsal joint and elevated first Metatarsal bone No dorsiflexion of the Hallux when forefoot is loaded.  Significant first Metatarsal head elevation with forefoot loading.   RADIOGRAPHIC STUDIES:  General overview: Negative of Osteopenia Absence of soft tissue swelling. Short first metatarsal length (relatively long 2nd met) bilateral.  Narrowing of joint space, osteophytic bone formation at bast of proximal phalanx and head of metatarsal, Fibular sesamoid position at 6 on right foot. Minimum pathologic change on left foot.  Large bone spur at head of the first  metatarsal and dorsal proximal base of proximal phalanx right.  Absence of acute osseous or articular surfaces of forefoot or rearfoot.   ASSESSMENT: 1. Osteoarthropathy first Metatarsophalangeal joint right: Dorsally displaced first Metatarsal with osteophytic bone formation at dorsal edge and at dorsal surface of the proximal phalanx.   2. Weak first Tarsometatarsal joint right.  3. Structural Hallux Limitus right.  PLAN: Reviewed clinical findings and available treatment options; injection, orthotics, NSAIA, change activity, including surgical.  Reviewed pros and cons of midfoot and forefoot corrective procedures: If we only correct the first Metatarsophalangeal joint, the weak midfoot joint of the first tarsometatarsal joint could continue bring about weak forefoot status and foot pain.  If we correct the weak midfoot joint by fusion of the first Tarsometatarsal joint, it would lead to extended recovery time, 10-12 weeks.  Patient will return in June to discuss further on treatment options.

## 2012-07-16 ENCOUNTER — Telehealth: Payer: Self-pay | Admitting: Internal Medicine

## 2012-07-16 NOTE — Telephone Encounter (Signed)
Pt scheduled with Dr Pearletha Forge on Thursday May 29,2014 at 3:00 pm.  Pt made aware of appt, date, time, & location.Janet [name]

## 2012-07-18 ENCOUNTER — Ambulatory Visit (INDEPENDENT_AMBULATORY_CARE_PROVIDER_SITE_OTHER): Payer: Managed Care, Other (non HMO) | Admitting: Family Medicine

## 2012-07-18 ENCOUNTER — Encounter: Payer: Self-pay | Admitting: Family Medicine

## 2012-07-18 VITALS — BP 115/73 | HR 66 | Ht 64.0 in | Wt 225.0 lb

## 2012-07-18 DIAGNOSIS — M25512 Pain in left shoulder: Secondary | ICD-10-CM

## 2012-07-18 DIAGNOSIS — M25519 Pain in unspecified shoulder: Secondary | ICD-10-CM

## 2012-07-18 NOTE — Patient Instructions (Addendum)
You have rotator cuff impingement Try to avoid painful activities (overhead activities, lifting with extended arm) as much as possible. Aleve and/or tylenol as needed for pain. Subacromial injection may be beneficial to help with pain and to decrease inflammation. Nitro patches are a consideration if you don't improve with PT and home exercises. Start physical therapy with transition to home exercise program. Do home exercise program with theraband and scapular stabilization exercises daily - these are very important for long term relief even if an injection was given. If not improving at follow-up we will consider injection, nitro patches. Follow up with me in 5-6 weeks.

## 2012-07-22 ENCOUNTER — Encounter: Payer: Self-pay | Admitting: Family Medicine

## 2012-07-22 NOTE — Assessment & Plan Note (Signed)
2/2 rotator cuff impingement.  Will start with physical therapy and home exercises.  NSAIDs as needed for pain.  Discussed relative rest.  Consider injection, nitro patches if not improving as expected.  F/u in 5-6 weeks.

## 2012-07-22 NOTE — Progress Notes (Signed)
Subjective:    Patient ID: Janet [name], female    DOB: Apr 28, 1965, 47 y.o.   MRN: [id number]  PCP: Dr. Constance Goltz  HPI 47 yo F here for left shoulder pain.  Patient denies known injury. States she's had left > right shoulder pain for about 2 years now. Has not done anything other than take motrin as needed for this. Used to bother her only with motions but has worsened to now bothering her at rest. Not done PT or home exercise program for this. Both shoulders feel weak. Some night pain. Pain level 4/10 but has improved over past few days. Is right handed.  Past Medical History  Diagnosis Date  . Arthritis   . Depression   . Hypertension   . Hyperlipidemia   . Goiter, nontoxic, multinodular   . Obesity   . Hemorrhoids     Current Outpatient Prescriptions on File Prior to Visit  Medication Sig Dispense Refill  . buPROPion (WELLBUTRIN SR) 150 MG 12 hr tablet Take one tablet daily  90 tablet  1  . lisinopril-hydrochlorothiazide (PRINZIDE,ZESTORETIC) 10-12.5 MG per tablet Take 1 tablet by mouth daily.  90 tablet  3  . metoprolol succinate (TOPROL-XL) 25 MG 24 hr tablet Take 1 tablet (25 mg total) by mouth daily.  90 tablet  3  . sertraline (ZOLOFT) 50 MG tablet Take one tablet nightly with food for 7 days then 2 tablets nightly  180 tablet  3  . cholecalciferol (VITAMIN D) 1000 UNITS tablet Take 5,000 Units by mouth daily.       Marland Kitchen LORazepam (ATIVAN) 0.5 MG tablet Take one tablet twice a week hs  10 tablet  0  . vitamin E 400 UNIT capsule Take 400 Units by mouth daily.       No current facility-administered medications on file prior to visit.    Past Surgical History  Procedure Laterality Date  . Appendectomy  7/10  . Cesarean section      No Known Allergies  History   Social History  . Marital Status: Legally Separated    Spouse Name: N/A    Number of Children: N/A  . Years of Education: N/A   Occupational History  . Not on file.   Social History Main  Topics  . Smoking status: Never Smoker   . Smokeless tobacco: Never Used  . Alcohol Use: No  . Drug Use: No  . Sexually Active: Not Currently   Other Topics Concern  . Not on file   Social History Narrative  . No narrative on file    Family History  Problem Relation Age of Onset  . Diabetes Mother   . Hypertension Father   . Hypertension Brother   . Asthma Sister   . COPD Maternal Grandmother   . Diabetes Paternal Grandmother   . Heart attack Neg Hx   . Hyperlipidemia Neg Hx   . Sudden death Neg Hx     BP 115/73  Pulse 66  Ht 5\' 4"  (1.626 m)  Wt 225 lb (102.059 kg)  BMI 38.6 kg/m2  Review of Systems See HPI above.    Objective:   Physical Exam Gen: NAD  L shoulder: No swelling, ecchymoses.  No gross deformity. No TTP AC or biceps tendon. FROM with mild painful arc. Positive Hawkins, Neers. Negative Speeds, Yergasons. Strength 5/5 with empty can and resisted internal/external rotation.  Mild pain with empty can, less ER. Negative apprehension. NV intact distally.  R shoulder: No swelling, ecchymoses.  No gross deformity. No TTP. FROM. Negative Hawkins, Neers. Negative Speeds, Yergasons. Strength 5/5 with empty can and resisted internal/external rotation. Negative apprehension. NV intact distally.    Assessment & Plan:  1. Bilateral shoulder pain - 2/2 rotator cuff impingement.  Will start with physical therapy and home exercises.  NSAIDs as needed for pain.  Discussed relative rest.  Consider injection, nitro patches if not improving as expected.  F/u in 5-6 weeks.

## 2012-07-24 ENCOUNTER — Ambulatory Visit: Payer: Managed Care, Other (non HMO) | Admitting: Physical Therapy

## 2012-07-25 ENCOUNTER — Other Ambulatory Visit: Payer: Self-pay | Admitting: Internal Medicine

## 2012-07-26 NOTE — Telephone Encounter (Signed)
Refill request

## 2012-08-28 ENCOUNTER — Ambulatory Visit: Payer: Managed Care, Other (non HMO) | Admitting: Family Medicine

## 2012-09-12 ENCOUNTER — Other Ambulatory Visit: Payer: Self-pay | Admitting: Endocrinology

## 2012-09-12 DIAGNOSIS — E049 Nontoxic goiter, unspecified: Secondary | ICD-10-CM

## 2012-11-05 ENCOUNTER — Other Ambulatory Visit: Payer: Self-pay | Admitting: Internal Medicine

## 2012-11-18 ENCOUNTER — Other Ambulatory Visit: Payer: Self-pay | Admitting: *Deleted

## 2012-11-18 ENCOUNTER — Other Ambulatory Visit: Payer: Self-pay | Admitting: Internal Medicine

## 2012-11-18 DIAGNOSIS — Z1231 Encounter for screening mammogram for malignant neoplasm of breast: Secondary | ICD-10-CM

## 2012-11-18 NOTE — Telephone Encounter (Signed)
Janet [name] needs her metoprolol succinate (TOPROL-XL) 25 MG 24 hr tablet [[id number]] Refilled through [facility]. They have been trying to contact Janet [name] for refill, but they had the wrong fax #.  Janet [name] only has 4 pills left.

## 2012-11-18 NOTE — Telephone Encounter (Signed)
Refill request

## 2012-11-19 MED ORDER — METOPROLOL SUCCINATE ER 25 MG PO TB24: 25.0000 mg | ORAL_TABLET | Freq: Every day | ORAL | Status: DC

## 2012-12-20 ENCOUNTER — Ambulatory Visit (HOSPITAL_BASED_OUTPATIENT_CLINIC_OR_DEPARTMENT_OTHER)
Admission: RE | Admit: 2012-12-20 | Discharge: 2012-12-20 | Disposition: A | Discharge status: Home / Self Care | Payer: Managed Care, Other (non HMO) | Source: Ambulatory Visit | Attending: Internal Medicine | Admitting: Internal Medicine

## 2012-12-20 ENCOUNTER — Other Ambulatory Visit (HOSPITAL_BASED_OUTPATIENT_CLINIC_OR_DEPARTMENT_OTHER): Payer: Self-pay | Admitting: Endocrinology

## 2012-12-20 DIAGNOSIS — Z1231 Encounter for screening mammogram for malignant neoplasm of breast: Secondary | ICD-10-CM

## 2012-12-20 DIAGNOSIS — E049 Nontoxic goiter, unspecified: Secondary | ICD-10-CM

## 2013-01-29 ENCOUNTER — Telehealth: Payer: Self-pay | Admitting: *Deleted

## 2013-02-10 ENCOUNTER — Telehealth: Payer: Self-pay | Admitting: *Deleted

## 2013-02-17 NOTE — Telephone Encounter (Signed)
error 

## 2013-03-03 ENCOUNTER — Other Ambulatory Visit: Payer: Managed Care, Other (non HMO)

## 2013-03-05 ENCOUNTER — Encounter: Payer: Self-pay | Admitting: Podiatry

## 2013-03-05 ENCOUNTER — Ambulatory Visit (INDEPENDENT_AMBULATORY_CARE_PROVIDER_SITE_OTHER): Payer: Managed Care, Other (non HMO) | Admitting: Podiatry

## 2013-03-05 VITALS — BP 126/81 | HR 63 | Ht 64.0 in | Wt 230.0 lb

## 2013-03-05 DIAGNOSIS — M216X2 Other acquired deformities of left foot: Secondary | ICD-10-CM

## 2013-03-05 DIAGNOSIS — M216X1 Other acquired deformities of right foot: Secondary | ICD-10-CM | POA: Insufficient documentation

## 2013-03-05 DIAGNOSIS — M21969 Unspecified acquired deformity of unspecified lower leg: Secondary | ICD-10-CM | POA: Insufficient documentation

## 2013-03-05 DIAGNOSIS — M21619 Bunion of unspecified foot: Secondary | ICD-10-CM

## 2013-03-05 DIAGNOSIS — M216X9 Other acquired deformities of unspecified foot: Secondary | ICD-10-CM | POA: Insufficient documentation

## 2013-03-05 HISTORY — DX: Unspecified acquired deformity of unspecified lower leg: M21.969

## 2013-03-05 NOTE — Progress Notes (Signed)
SUBJECTIVE:  47 y.o. year old female presents accompanied by her daughter requesting surgical intervention on her painful right foot.   OBJECTIVE:  DERMATOLOGIC EXAMINATION:  Skin and Nails are normal appearance.  VASCULAR EXAMINATION OF LOWER LIMBS:  Pedal pulses: All pedal pulses are palpable with normal pulsation.  NEUROLOGIC EXAMINATION OF THE LOWER LIMBS:  Achilles DTR is present and within normal.  Epicritic and tactile sensations grossly normal.  MUSCULOSKELETAL EXAMINATION:  Tight Achilles tendon, unable to dorsiflex beyond 90 degree with knee flexion and extension bilateral.  Positive of weak and hypermobile first Tarsometatarsal joint bilateral. Pain and limited hallux dorsiflexion right foot when forefoot is loaded.  Right foot has enlarged dorsomedial aspect of the right first Metatarsal head.  Positive change noted of excess motion at the first Tarsometatarsal joint and elevated first Metatarsal bone  No dorsiflexion of the Hallux when forefoot is loaded.  Significant first Metatarsal head elevation with forefoot loading.  Previous X-ray showed  Short first metatarsal length (relatively long 2nd met) bilateral.  Narrowing of joint space, osteophytic bone formation at bast of proximal phalanx and head of metatarsal, Fibular sesamoid position at 6 on right foot. Minimum pathologic change on left foot.  Large bone spur at head of the first metatarsal and dorsal proximal base of proximal phalanx right.   ASSESSMENT:  1. Hallux limitus with osteoarthropathy first Metatarsophalangeal joint right: Dorsally displaced first Metatarsal with osteophytic bone formation at dorsal edge and at dorsal surface of the proximal phalanx.  2. Weak and elevated first Tarsometatarsal joint right.  3. Ankle equinus bilateral. 4. Excess Subtalar joint pronation with periarticular subluxation right.  Plan: Reviewed clinical findings and possible surgical options; Tendo achilles lengthening, STJ  arthroereisis, Cotton osteotomy with bone graft, and McBride bunionectomy. Consent form reviewed with patient and her daughter.

## 2013-03-05 NOTE — Patient Instructions (Signed)
Seen for pre op discussion.  Reviewed for Tendo achilles lengthening, Subtalar joint implant, Cotton osteotomy with bone graft and resection of bone spur all in right foot.

## 2013-03-25 ENCOUNTER — Ambulatory Visit (HOSPITAL_BASED_OUTPATIENT_CLINIC_OR_DEPARTMENT_OTHER)
Admission: RE | Admit: 2013-03-25 | Discharge: 2013-03-25 | Disposition: A | Discharge status: Home / Self Care | Payer: Managed Care, Other (non HMO) | Source: Ambulatory Visit | Attending: Endocrinology | Admitting: Endocrinology

## 2013-03-25 DIAGNOSIS — E049 Nontoxic goiter, unspecified: Secondary | ICD-10-CM

## 2013-03-25 DIAGNOSIS — R599 Enlarged lymph nodes, unspecified: Secondary | ICD-10-CM | POA: Insufficient documentation

## 2013-03-25 DIAGNOSIS — E042 Nontoxic multinodular goiter: Secondary | ICD-10-CM | POA: Insufficient documentation

## 2013-04-03 DIAGNOSIS — M2010 Hallux valgus (acquired), unspecified foot: Secondary | ICD-10-CM

## 2013-04-03 DIAGNOSIS — Q664 Congenital talipes calcaneovalgus, unspecified foot: Secondary | ICD-10-CM

## 2013-04-03 DIAGNOSIS — M6240 Contracture of muscle, unspecified site: Secondary | ICD-10-CM

## 2013-04-03 HISTORY — PX: OTHER SURGICAL HISTORY: SHX169

## 2013-04-03 HISTORY — PX: ACHILLES TENDON LENGTHENING: SHX6455

## 2013-04-07 ENCOUNTER — Other Ambulatory Visit: Payer: Self-pay | Admitting: Internal Medicine

## 2013-04-08 ENCOUNTER — Encounter: Payer: Managed Care, Other (non HMO) | Admitting: Podiatry

## 2013-04-09 ENCOUNTER — Encounter: Payer: Self-pay | Admitting: Podiatry

## 2013-04-09 ENCOUNTER — Ambulatory Visit (INDEPENDENT_AMBULATORY_CARE_PROVIDER_SITE_OTHER): Payer: Managed Care, Other (non HMO) | Admitting: Podiatry

## 2013-04-09 VITALS — BP 142/83 | HR 66

## 2013-04-09 DIAGNOSIS — M216X9 Other acquired deformities of unspecified foot: Secondary | ICD-10-CM

## 2013-04-09 DIAGNOSIS — M216X2 Other acquired deformities of left foot: Principal | ICD-10-CM

## 2013-04-09 DIAGNOSIS — M216X1 Other acquired deformities of right foot: Secondary | ICD-10-CM

## 2013-04-09 NOTE — Progress Notes (Signed)
Came in with right lower limb casted and walking aided by crutches. Doing well without any problems.  Taking pain twice a day. All digits are in normal color and moving well. Assessment: Satisfactory post op progress.  Plan: Continue current level of care.

## 2013-04-09 NOTE — Patient Instructions (Signed)
Post op visit. Doing well. Return in 3 weeks to change cast.

## 2013-04-11 ENCOUNTER — Ambulatory Visit (HOSPITAL_BASED_OUTPATIENT_CLINIC_OR_DEPARTMENT_OTHER): Payer: Managed Care, Other (non HMO)

## 2013-04-11 ENCOUNTER — Other Ambulatory Visit: Payer: Managed Care, Other (non HMO)

## 2013-04-18 ENCOUNTER — Other Ambulatory Visit: Payer: Self-pay | Admitting: Internal Medicine

## 2013-04-18 ENCOUNTER — Other Ambulatory Visit: Payer: Self-pay | Admitting: *Deleted

## 2013-04-18 NOTE — Telephone Encounter (Signed)
Refill request pt is out of medication will send in 7 day supply to [facility]

## 2013-04-21 ENCOUNTER — Other Ambulatory Visit: Payer: Self-pay | Admitting: *Deleted

## 2013-04-21 MED ORDER — BUPROPION HCL ER (SR) 150 MG PO TB12: 150.0000 mg | ORAL_TABLET | Freq: Every day | ORAL | Status: DC

## 2013-04-21 MED ORDER — SERTRALINE HCL 50 MG PO TABS: ORAL_TABLET | ORAL | Status: DC

## 2013-04-22 ENCOUNTER — Other Ambulatory Visit: Payer: Self-pay | Admitting: *Deleted

## 2013-04-22 MED ORDER — BUPROPION HCL ER (SR) 150 MG PO TB12
150.0000 mg | ORAL_TABLET | Freq: Every day | ORAL | Status: DC
Start: 2013-04-22 — End: 2013-12-10

## 2013-04-22 MED ORDER — SERTRALINE HCL 50 MG PO TABS: ORAL_TABLET | ORAL | Status: DC

## 2013-04-25 ENCOUNTER — Encounter: Payer: Self-pay | Admitting: Podiatry

## 2013-04-25 ENCOUNTER — Ambulatory Visit (INDEPENDENT_AMBULATORY_CARE_PROVIDER_SITE_OTHER): Payer: Managed Care, Other (non HMO) | Admitting: Podiatry

## 2013-04-25 VITALS — BP 109/66 | HR 79

## 2013-04-25 DIAGNOSIS — M216X1 Other acquired deformities of right foot: Secondary | ICD-10-CM

## 2013-04-25 DIAGNOSIS — M216X9 Other acquired deformities of unspecified foot: Secondary | ICD-10-CM

## 2013-04-25 DIAGNOSIS — M21619 Bunion of unspecified foot: Secondary | ICD-10-CM

## 2013-04-25 DIAGNOSIS — M216X2 Other acquired deformities of left foot: Secondary | ICD-10-CM

## 2013-04-25 NOTE — Progress Notes (Signed)
3 weeks post op, Cotton, TAL, McBride right foot.  Doing well with cast on right foot.  Cast removed and replaced. All sutures removed. X-ray show normal findings without any change in graft position.  Cast boot dispensed.  Return in 3 weeks.

## 2013-04-29 ENCOUNTER — Telehealth: Payer: Self-pay | Admitting: *Deleted

## 2013-04-29 ENCOUNTER — Ambulatory Visit (INDEPENDENT_AMBULATORY_CARE_PROVIDER_SITE_OTHER): Payer: Managed Care, Other (non HMO) | Admitting: Podiatry

## 2013-04-29 ENCOUNTER — Encounter: Payer: Self-pay | Admitting: Podiatry

## 2013-04-29 DIAGNOSIS — M79671 Pain in right foot: Secondary | ICD-10-CM

## 2013-04-29 DIAGNOSIS — M216X9 Other acquired deformities of unspecified foot: Secondary | ICD-10-CM

## 2013-04-29 DIAGNOSIS — M79609 Pain in unspecified limb: Secondary | ICD-10-CM

## 2013-04-29 HISTORY — DX: Pain in right foot: M79.671

## 2013-04-29 NOTE — Progress Notes (Signed)
48 year old status post TAL and Cotton osteotomy on right foot, stated that she had heard a popping sound and sharp pain in her front part of ankle. This happened while she was in bed last night. It has been hurting since.  Objective: Cast is intact and in good maintenance.  X-rays taken and saw no change in graft or bone structure. Keep next appointment.   Assessment: Possible remnant string of achilles tendon stretching.   Plan: Reviewed the findings.  Patient will keep her next appointment.

## 2013-04-29 NOTE — Patient Instructions (Signed)
X-rays taken and saw no change in graft or bone structure. Keep next appointment.

## 2013-04-29 NOTE — Telephone Encounter (Signed)
04/29/2013 Pt called this afternoon stating she was awakened by a snap in her surgery foot and then a lot of pain. Told pt she needs to come in this afternoon.

## 2013-05-16 ENCOUNTER — Encounter: Payer: Self-pay | Admitting: Podiatry

## 2013-05-16 ENCOUNTER — Ambulatory Visit (INDEPENDENT_AMBULATORY_CARE_PROVIDER_SITE_OTHER): Payer: Managed Care, Other (non HMO) | Admitting: Podiatry

## 2013-05-16 VITALS — BP 133/75 | HR 67

## 2013-05-16 DIAGNOSIS — M21969 Unspecified acquired deformity of unspecified lower leg: Secondary | ICD-10-CM

## 2013-05-16 DIAGNOSIS — M216X9 Other acquired deformities of unspecified foot: Secondary | ICD-10-CM

## 2013-05-16 NOTE — Patient Instructions (Signed)
Post op visit.  Doing well. Cast removed and replaced with CAM walker. Use CAM walker for ambulation. Return in 3 weeks.

## 2013-05-16 NOTE — Progress Notes (Signed)
Subjective: 6 weeks post since Cotton and TAL surgery on right. Doing well with cast on right lower limb. Having some numbness over the big toe.  Objective: Surgical site has healed well. No edema or erythema noted.  Assessment: Satisfactory wound healing right lower limb.   Plan: Cast removed.  CAM walker dispensed to use for ambulation for the next 3 weeks.  Then will change to regular shoes.

## 2013-06-06 ENCOUNTER — Encounter: Payer: Self-pay | Admitting: Podiatry

## 2013-06-06 ENCOUNTER — Ambulatory Visit (INDEPENDENT_AMBULATORY_CARE_PROVIDER_SITE_OTHER): Payer: Managed Care, Other (non HMO) | Admitting: Podiatry

## 2013-06-06 VITALS — BP 124/75 | HR 69

## 2013-06-06 DIAGNOSIS — M216X1 Other acquired deformities of right foot: Secondary | ICD-10-CM

## 2013-06-06 DIAGNOSIS — M21969 Unspecified acquired deformity of unspecified lower leg: Secondary | ICD-10-CM

## 2013-06-06 DIAGNOSIS — M216X2 Other acquired deformities of left foot: Secondary | ICD-10-CM

## 2013-06-06 DIAGNOSIS — M79609 Pain in unspecified limb: Secondary | ICD-10-CM

## 2013-06-06 DIAGNOSIS — M216X9 Other acquired deformities of unspecified foot: Secondary | ICD-10-CM

## 2013-06-06 DIAGNOSIS — M79671 Pain in right foot: Secondary | ICD-10-CM

## 2013-06-06 NOTE — Progress Notes (Signed)
9 week post op following Cotton osteotomy with bone graft, [place] bunionectomy and TAL right lower limb. Stated that she is doing well walking in CAM walker.   Objective: Well healed post op wound. Good ankle joint motion. Adequate pronatory motion of STJ right.  Assessment: Satisfactory post op progression.  Good plantar graded first ray with forefoot loading. STJ pronation is reduced to normal range.   Plan: Will start normal weight bearing ambulation in Tennis shoes for the next couple of weeks. If she is doing well, she will return to work.  Will use Ankle brace to assist during transition from CAM walker to regular tennis shoes.

## 2013-06-06 NOTE — Patient Instructions (Signed)
9 week post op. Doing well. May start walking in Tennis shoes. Use Ankle brace during ambulation.

## 2013-06-18 ENCOUNTER — Encounter: Payer: Self-pay | Admitting: Podiatry

## 2013-06-18 ENCOUNTER — Ambulatory Visit (INDEPENDENT_AMBULATORY_CARE_PROVIDER_SITE_OTHER): Payer: Managed Care, Other (non HMO) | Admitting: Podiatry

## 2013-06-18 VITALS — BP 106/70 | HR 68

## 2013-06-18 DIAGNOSIS — M79671 Pain in right foot: Secondary | ICD-10-CM

## 2013-06-18 DIAGNOSIS — M21969 Unspecified acquired deformity of unspecified lower leg: Secondary | ICD-10-CM

## 2013-06-18 DIAGNOSIS — M79609 Pain in unspecified limb: Secondary | ICD-10-CM

## 2013-06-18 NOTE — Patient Instructions (Signed)
11 weeks post op right foot. Having some swelling after been on feet. Continue to use Ankle brace and may use compression socks. Will prepare for Orthotics.

## 2013-06-18 NOTE — Progress Notes (Signed)
Subjective: 11 weeks post op. Doing well with Ankle brace and regular tennis shoes. Experiencing some lower leg edema and some arch pain after been on feet right foot. Stated that she is able to tell the foot is more stable and not turning like it used to.   Objective: Good solid plantar flexion of the first ray right with ankle joint motion.  Lower limb edema right.   Assessment: Satisfactory post op progress following Cotton and TAL right.  Plan: May return to work as of Jun 23, 2013.  Patient feels she is ready to return to her job without restrictions.

## 2013-07-17 ENCOUNTER — Telehealth: Payer: Self-pay | Admitting: *Deleted

## 2013-07-17 NOTE — Telephone Encounter (Signed)
Janet [name] would like to talk to you about a councilor for her daughter. You can reach her before 5:00 at [phone] or after 5:00 at [phone]

## 2013-08-08 ENCOUNTER — Encounter: Payer: Self-pay | Admitting: Podiatry

## 2013-08-08 ENCOUNTER — Ambulatory Visit (INDEPENDENT_AMBULATORY_CARE_PROVIDER_SITE_OTHER): Payer: Managed Care, Other (non HMO) | Admitting: Podiatry

## 2013-08-08 VITALS — BP 138/89 | HR 65

## 2013-08-08 DIAGNOSIS — M21969 Unspecified acquired deformity of unspecified lower leg: Secondary | ICD-10-CM

## 2013-08-08 NOTE — Patient Instructions (Signed)
Seen for follow up on orthotics. Reviewed findings.

## 2013-08-08 NOTE — Progress Notes (Signed)
4 month status post Cotton osteotomy and TAL right foot. Wearing Orthotics x 1 month.  Maintained correction and walking well with Orthotics  Some discomfort at the first MPJ right foot and ankle joint. Possible from increased weight bearing through the first ray and changed biomechanics on ankle joint right. Reviewed the findings.  Return as needed.

## 2013-08-23 ENCOUNTER — Other Ambulatory Visit: Payer: Self-pay | Admitting: Internal Medicine

## 2013-08-25 ENCOUNTER — Telehealth: Payer: Self-pay | Admitting: *Deleted

## 2013-08-25 MED ORDER — LISINOPRIL-HYDROCHLOROTHIAZIDE 10-12.5 MG PO TABS: ORAL_TABLET | ORAL | Status: DC

## 2013-08-25 NOTE — Telephone Encounter (Signed)
Needs Lisinopril sent to [facility]

## 2013-08-28 ENCOUNTER — Telehealth: Payer: Self-pay | Admitting: *Deleted

## 2013-08-28 ENCOUNTER — Other Ambulatory Visit: Payer: Self-pay | Admitting: Internal Medicine

## 2013-08-28 NOTE — Telephone Encounter (Signed)
sertraline (ZOLOFT) 50 MG tablet [[id number]]  Needs a 30 day supply to hold her until her Rx comes from mail order.  Mail order says they have received Rx, but they can't give her a date she will received it.  The Mail order called in an override so that her insurance will cover.  She is out.  Please send to [facility].

## 2013-08-29 NOTE — Telephone Encounter (Signed)
Called 30 day supply into pharm at [facility] [place] - will refuse escript refill request

## 2013-08-29 NOTE — Telephone Encounter (Signed)
Called 30 day supply to pharmacy at [facility] [place]

## 2013-09-13 ENCOUNTER — Encounter: Payer: Self-pay | Admitting: Internal Medicine

## 2013-09-13 DIAGNOSIS — K635 Polyp of colon: Secondary | ICD-10-CM | POA: Insufficient documentation

## 2013-09-18 ENCOUNTER — Other Ambulatory Visit (HOSPITAL_BASED_OUTPATIENT_CLINIC_OR_DEPARTMENT_OTHER): Payer: Self-pay | Admitting: Endocrinology

## 2013-09-18 DIAGNOSIS — E049 Nontoxic goiter, unspecified: Secondary | ICD-10-CM

## 2013-09-23 ENCOUNTER — Ambulatory Visit (HOSPITAL_BASED_OUTPATIENT_CLINIC_OR_DEPARTMENT_OTHER)
Admission: RE | Admit: 2013-09-23 | Discharge: 2013-09-23 | Disposition: A | Discharge status: Home / Self Care | Payer: Managed Care, Other (non HMO) | Source: Ambulatory Visit | Attending: Endocrinology | Admitting: Endocrinology

## 2013-09-23 ENCOUNTER — Ambulatory Visit (HOSPITAL_BASED_OUTPATIENT_CLINIC_OR_DEPARTMENT_OTHER): Admission: RE | Admit: 2013-09-23 | Payer: Managed Care, Other (non HMO) | Source: Ambulatory Visit

## 2013-09-23 DIAGNOSIS — E049 Nontoxic goiter, unspecified: Secondary | ICD-10-CM | POA: Insufficient documentation

## 2013-09-26 ENCOUNTER — Encounter: Payer: Self-pay | Admitting: *Deleted

## 2013-10-01 ENCOUNTER — Ambulatory Visit (INDEPENDENT_AMBULATORY_CARE_PROVIDER_SITE_OTHER): Payer: Managed Care, Other (non HMO) | Admitting: Internal Medicine

## 2013-10-01 ENCOUNTER — Encounter: Payer: Self-pay | Admitting: Internal Medicine

## 2013-10-01 VITALS — BP 109/67 | HR 75 | Temp 98.1°F | Resp 16 | Ht 64.0 in | Wt 239.0 lb

## 2013-10-01 DIAGNOSIS — R0989 Other specified symptoms and signs involving the circulatory and respiratory systems: Secondary | ICD-10-CM

## 2013-10-01 DIAGNOSIS — R0609 Other forms of dyspnea: Secondary | ICD-10-CM

## 2013-10-01 DIAGNOSIS — R61 Generalized hyperhidrosis: Secondary | ICD-10-CM

## 2013-10-01 DIAGNOSIS — E042 Nontoxic multinodular goiter: Secondary | ICD-10-CM

## 2013-10-01 DIAGNOSIS — E049 Nontoxic goiter, unspecified: Secondary | ICD-10-CM

## 2013-10-01 DIAGNOSIS — I10 Essential (primary) hypertension: Secondary | ICD-10-CM

## 2013-10-01 NOTE — Progress Notes (Signed)
Subjective:    Patient ID: Janet [name], female    DOB: 05-Jun-1965, 48 y.o.   MRN: [id number]  HPI Janet [name] is here for acute visit.  She reports she had seen Dr. Chalmers Cater for her thyroid and MD had suggested she have an AIC .  Last labs over 1 year ago.  She denies poluria, polyphagia, or weight loss  She is wondering if her Zoloft is making her sweat so much and would like to taper off this.  She will stay on the Wellbutrin    She is UTD with colonoscopy  And mm  She knows she is snoring and thinks she may stop breathing at night.   HTN  Tolerating meds  No Known Allergies Past Medical History  Diagnosis Date  . Arthritis   . Depression   . Hypertension   . Hyperlipidemia   . Goiter, nontoxic, multinodular   . Obesity   . Hemorrhoids    Past Surgical History  Procedure Laterality Date  . Appendectomy  7/10  . Cesarean section    . Mcbride bunionectomy Right 04/03/2013    @ [place]  . Achilles tendon lengthening Right 04/03/2013    @ [place]  . Cotton osteotomy w/ bone graft Right 04/03/2013    @ [place]   History   Social History  . Marital Status: Divorced    Spouse Name: N/A    Number of Children: N/A  . Years of Education: N/A   Occupational History  . Not on file.   Social History Main Topics  . Smoking status: Never Smoker   . Smokeless tobacco: Never Used  . Alcohol Use: No  . Drug Use: No  . Sexual Activity: Not Currently   Other Topics Concern  . Not on file   Social History Narrative  . No narrative on file   Family History  Problem Relation Age of Onset  . Diabetes Mother   . Hypertension Father   . Hypertension Brother   . Asthma Sister   . COPD Maternal Grandmother   . Diabetes Paternal Grandmother   . Heart attack Neg Hx   . Hyperlipidemia Neg Hx   . Sudden death Neg Hx    Patient Active Problem List   Diagnosis Date Noted  . Colon polyps  colonoscopy done 08/2013 repeat 5 years 09/13/2013  . Foot pain, right 04/29/2013  . Equinus  deformity of foot, acquired 03/05/2013  . Metatarsal deformity 03/05/2013  . Bilateral pronation deformity of feet 03/05/2013  . Left shoulder pain 06/13/2012  . Bunion of great toe 06/13/2012  . Uterine fibroid 07/03/2011  . Chest pain 05/04/2011  . Depression   . Hypertension   . Hyperlipidemia   . Goiter, nontoxic, multinodular   . Obesity   . Hemorrhoids    Current Outpatient Prescriptions on File Prior to Visit  Medication Sig Dispense Refill  . buPROPion (WELLBUTRIN SR) 150 MG 12 hr tablet Take 1 tablet (150 mg total) by mouth daily.  30 tablet  0  . cholecalciferol (VITAMIN D) 1000 UNITS tablet Take 5,000 Units by mouth daily.       Marland Kitchen lisinopril-hydrochlorothiazide (PRINZIDE,ZESTORETIC) 10-12.5 MG per tablet TAKE 1 TABLET BY MOUTH DAILY  90 tablet  2  . metoprolol succinate (TOPROL-XL) 25 MG 24 hr tablet Take 1 tablet (25 mg total) by mouth daily.  90 tablet  3  . sertraline (ZOLOFT) 50 MG tablet Take 2 tablets nightly  60 tablet  0  . HYDROcodone-acetaminophen (NORCO) 7.5-325  MG per tablet Take 1 tablet by mouth every 6 (six) hours as needed.       . nabumetone (RELAFEN) 500 MG tablet Take 500 mg by mouth 2 (two) times daily as needed.       . vitamin E 400 UNIT capsule Take 400 Units by mouth daily.       No current facility-administered medications on file prior to visit.       Review of Systems See HPI    Objective:   Physical Exam Physical Exam  Nursing note and vitals reviewed.  Constitutional: She is oriented to person, place, and time. She appears well-developed and well-nourished.  HENT:  Head: Normocephalic and atraumatic.  Cardiovascular: Normal rate and regular rhythm. Exam reveals no gallop and no friction rub.  No murmur heard.  Pulmonary/Chest: Breath sounds normal. She has no wheezes. She has no rales.  Neurological: She is alert and oriented to person, place, and time.  Skin: Skin is warm and dry.  Psychiatric: She has a normal mood and affect.  Her behavior is normal.         Assessment & Plan:  Diaphoresis:   Will try tapering off Zoloft  Qod for next two weeks.  Stay on Wellbutrin  HTN:  Continue meds  Will get all labs and AIC tody    Snoring   Sleep disurbance  Will discuss furhter at CPE in November  Will likely need work up for sleep apnea

## 2013-10-02 NOTE — Addendum Note (Signed)
Addended by: Sherrye Payor on: 10/02/2013 11:52 AM   Modules accepted: Orders

## 2013-10-03 LAB — COMPREHENSIVE METABOLIC PANEL
ALT: 24 U/L (ref 0–35)
AST: 24 U/L (ref 0–37)
Albumin: 4.1 g/dL (ref 3.5–5.2)
Alkaline Phosphatase: 119 U/L — ABNORMAL HIGH (ref 39–117)
BUN: 8 mg/dL (ref 6–23)
CO2: 30 meq/L (ref 19–32)
Calcium: 9.4 mg/dL (ref 8.4–10.5)
Chloride: 97 mEq/L (ref 96–112)
Creat: 0.72 mg/dL (ref 0.50–1.10)
GLUCOSE: 79 mg/dL (ref 70–99)
POTASSIUM: 4.1 meq/L (ref 3.5–5.3)
SODIUM: 135 meq/L (ref 135–145)
TOTAL PROTEIN: 6.8 g/dL (ref 6.0–8.3)
Total Bilirubin: 0.4 mg/dL (ref 0.2–1.2)

## 2013-10-03 LAB — LIPID PANEL
CHOLESTEROL: 247 mg/dL — AB (ref 0–200)
HDL: 63 mg/dL (ref >39)
LDL CALC: 155 mg/dL — AB (ref 0–99)
TRIGLYCERIDES: 143 mg/dL (ref <150)
Total CHOL/HDL Ratio: 3.9 Ratio
VLDL: 29 mg/dL (ref 0–40)

## 2013-10-03 LAB — CBC WITH DIFFERENTIAL/PLATELET
Basophils Absolute: 0.1 10*3/uL (ref 0.0–0.1)
Basophils Relative: 1 % (ref 0–1)
Eosinophils Absolute: 0.1 10*3/uL (ref 0.0–0.7)
Eosinophils Relative: 1 % (ref 0–5)
HCT: 37.4 % (ref 36.0–46.0)
Hemoglobin: 12.4 g/dL (ref 12.0–15.0)
LYMPHS ABS: 2.4 10*3/uL (ref 0.7–4.0)
Lymphocytes Relative: 26 % (ref 12–46)
MCH: 28.1 pg (ref 26.0–34.0)
MCHC: 33.2 g/dL (ref 30.0–36.0)
MCV: 84.8 fL (ref 78.0–100.0)
MONOS PCT: 5 % (ref 3–12)
Monocytes Absolute: 0.5 10*3/uL (ref 0.1–1.0)
NEUTROS ABS: 6.1 10*3/uL (ref 1.7–7.7)
NEUTROS PCT: 67 % (ref 43–77)
Platelets: 422 10*3/uL — ABNORMAL HIGH (ref 150–400)
RBC: 4.41 MIL/uL (ref 3.87–5.11)
RDW: 15 % (ref 11.5–15.5)
WBC: 9.1 10*3/uL (ref 4.0–10.5)

## 2013-10-03 LAB — TSH: TSH: 1.215 u[IU]/mL (ref 0.350–4.500)

## 2013-10-03 LAB — HEMOGLOBIN A1C
HEMOGLOBIN A1C: 6.2 % — AB (ref <5.7)
Mean Plasma Glucose: 131 mg/dL — ABNORMAL HIGH (ref <117)

## 2013-10-03 LAB — VITAMIN D 25 HYDROXY (VIT D DEFICIENCY, FRACTURES): VIT D 25 HYDROXY: 46 ng/mL (ref 30–89)

## 2013-10-15 ENCOUNTER — Encounter: Payer: Self-pay | Admitting: Internal Medicine

## 2013-10-15 ENCOUNTER — Ambulatory Visit (INDEPENDENT_AMBULATORY_CARE_PROVIDER_SITE_OTHER): Payer: Managed Care, Other (non HMO) | Admitting: Internal Medicine

## 2013-10-15 VITALS — BP 111/66 | HR 75 | Resp 16 | Ht 64.0 in | Wt 238.0 lb

## 2013-10-15 DIAGNOSIS — E785 Hyperlipidemia, unspecified: Secondary | ICD-10-CM

## 2013-10-15 DIAGNOSIS — E669 Obesity, unspecified: Secondary | ICD-10-CM

## 2013-10-15 DIAGNOSIS — R739 Hyperglycemia, unspecified: Secondary | ICD-10-CM

## 2013-10-15 DIAGNOSIS — R7309 Other abnormal glucose: Secondary | ICD-10-CM

## 2013-10-15 NOTE — Patient Instructions (Signed)
Will set up with diabetes nutrition center for borderline diabetes

## 2013-10-15 NOTE — Progress Notes (Signed)
Subjective:    Patient ID: Janet [name], female    DOB: 06-01-1965, 48 y.o.   MRN: [id number]  HPI  Janet [name] is here to follow on several issues.     See glucose and AIC    Her BMI is 40    See lipids.    She does not wish Rx meds     No Known Allergies Past Medical History  Diagnosis Date  . Arthritis   . Depression   . Hypertension   . Hyperlipidemia   . Goiter, nontoxic, multinodular   . Obesity   . Hemorrhoids    Past Surgical History  Procedure Laterality Date  . Appendectomy  7/10  . Cesarean section    . Mcbride bunionectomy Right 04/03/2013    @ [place]  . Achilles tendon lengthening Right 04/03/2013    @ [place]  . Cotton osteotomy w/ bone graft Right 04/03/2013    @ [place]   History   Social History  . Marital Status: Divorced    Spouse Name: N/A    Number of Children: N/A  . Years of Education: N/A   Occupational History  . Not on file.   Social History Main Topics  . Smoking status: Never Smoker   . Smokeless tobacco: Never Used  . Alcohol Use: No  . Drug Use: No  . Sexual Activity: Not Currently   Other Topics Concern  . Not on file   Social History Narrative  . No narrative on file   Family History  Problem Relation Age of Onset  . Diabetes Mother   . Hypertension Father   . Hypertension Brother   . Asthma Sister   . COPD Maternal Grandmother   . Diabetes Paternal Grandmother   . Heart attack Neg Hx   . Hyperlipidemia Neg Hx   . Sudden death Neg Hx    Patient Active Problem List   Diagnosis Date Noted  . Colon polyps  colonoscopy done 08/2013 repeat 5 years 09/13/2013  . Foot pain, right 04/29/2013  . Equinus deformity of foot, acquired 03/05/2013  . Metatarsal deformity 03/05/2013  . Bilateral pronation deformity of feet 03/05/2013  . Left shoulder pain 06/13/2012  . Bunion of great toe 06/13/2012  . Uterine fibroid 07/03/2011  . Chest pain 05/04/2011  . Depression   . Hypertension   . Hyperlipidemia   . Goiter, nontoxic,  multinodular   . Obesity   . Hemorrhoids    Current Outpatient Prescriptions on File Prior to Visit  Medication Sig Dispense Refill  . buPROPion (WELLBUTRIN SR) 150 MG 12 hr tablet Take 1 tablet (150 mg total) by mouth daily.  30 tablet  0  . cholecalciferol (VITAMIN D) 1000 UNITS tablet Take 5,000 Units by mouth daily.       Marland Kitchen lisinopril-hydrochlorothiazide (PRINZIDE,ZESTORETIC) 10-12.5 MG per tablet TAKE 1 TABLET BY MOUTH DAILY  90 tablet  2  . metoprolol succinate (TOPROL-XL) 25 MG 24 hr tablet Take 1 tablet (25 mg total) by mouth daily.  90 tablet  3  . sertraline (ZOLOFT) 50 MG tablet Take 2 tablets nightly  60 tablet  0  . vitamin E 400 UNIT capsule Take 400 Units by mouth daily.       No current facility-administered medications on file prior to visit.      Review of Systems See HPI    Objective:   Physical Exam Physical Exam  Nursing note and vitals reviewed.  Constitutional: She is oriented to person, place, and  time. She appears well-developed and well-nourished.  HENT:  Head: Normocephalic and atraumatic.  Cardiovascular: Normal rate and regular rhythm. Exam reveals no gallop and no friction rub.  No murmur heard.  Pulmonary/Chest: Breath sounds normal. She has no wheezes. She has no rales.  Neurological: She is alert and oriented to person, place, and time.  Skin: Skin is warm and dry.  Psychiatric: She has a normal mood and affect. Her behavior is normal.              Assessment & Plan:  Borderline DM  Will set up referral to diabetes center for low carb and weight loss  Low fat diet  Hyperliidemia  She does not wish Rx now  Will try diet and exercise  Obesity: [facility].  She is to let me know if she wants referral   See me in November

## 2013-10-29 ENCOUNTER — Telehealth: Payer: Self-pay | Admitting: *Deleted

## 2013-10-29 NOTE — Telephone Encounter (Signed)
[name]  I do not have any documentation that would support breast reduction surgery.  Usually she tells the surgeon of her difficulties  and the surgeon documents this and his staff checks with insurance.  She does not need to see me for this  She is to call and follow up with Dr. Barbaraann Barthel who was seeing her for her shoulder

## 2013-10-29 NOTE — Telephone Encounter (Signed)
Janet [name] called she has consultation with breast reduction surgeon who said for insurance she will need documentation of any problems that she is having for the need of the surgery.  She wants to know whether she needs to come in for a consultation for the breast issues or is there enough documentation from prior visits.  She also was referred to PT for her arm and should pain.  At the time she was unable to go to therapy ( I think financial reasons).  Her should has not gotten better.  She called them, and they requested that she get a new or updated referral since so much time has passed since last one.  Janet [name] requests a return call as how we need to proceed

## 2013-10-30 NOTE — Telephone Encounter (Signed)
I spoke with Janet [name] and let her know that we do not have any documentation at this time to support breast reduction surgery. -eh

## 2013-11-05 ENCOUNTER — Telehealth: Payer: Self-pay | Admitting: Family Medicine

## 2013-11-05 NOTE — Telephone Encounter (Signed)
It's been over a year since we've seen her so we would have to see her back.

## 2013-11-06 ENCOUNTER — Ambulatory Visit (INDEPENDENT_AMBULATORY_CARE_PROVIDER_SITE_OTHER): Payer: Managed Care, Other (non HMO) | Admitting: Family Medicine

## 2013-11-06 ENCOUNTER — Encounter: Payer: Self-pay | Admitting: Family Medicine

## 2013-11-06 VITALS — BP 125/81 | HR 71 | Ht 64.0 in | Wt 230.0 lb

## 2013-11-06 DIAGNOSIS — M67919 Unspecified disorder of synovium and tendon, unspecified shoulder: Secondary | ICD-10-CM

## 2013-11-06 DIAGNOSIS — M25512 Pain in left shoulder: Secondary | ICD-10-CM

## 2013-11-06 DIAGNOSIS — M25511 Pain in right shoulder: Secondary | ICD-10-CM

## 2013-11-06 DIAGNOSIS — M75101 Unspecified rotator cuff tear or rupture of right shoulder, not specified as traumatic: Secondary | ICD-10-CM

## 2013-11-06 DIAGNOSIS — M719 Bursopathy, unspecified: Secondary | ICD-10-CM

## 2013-11-06 DIAGNOSIS — M25519 Pain in unspecified shoulder: Secondary | ICD-10-CM

## 2013-11-06 DIAGNOSIS — M75102 Unspecified rotator cuff tear or rupture of left shoulder, not specified as traumatic: Principal | ICD-10-CM

## 2013-11-06 DIAGNOSIS — R29898 Other symptoms and signs involving the musculoskeletal system: Secondary | ICD-10-CM

## 2013-11-06 NOTE — Patient Instructions (Signed)
You have rotator cuff impingement though I'm concerned you may also have some type of neuropathy. Try to avoid painful activities (overhead activities, lifting with extended arm) as much as possible. Aleve 2 tabs twice a day with food OR ibuprofen 3 tabs three times a day with food for pain and inflammation. Can take tylenol in addition to this. Subacromial injection may be beneficial to help with pain and to decrease inflammation if not improving. Start physical therapy with transition to home exercise program. Do home exercise program with theraband and scapular stabilization exercises daily - these are very important for long term relief even if an injection was given. If not improving will consider neurology referral.

## 2013-11-10 ENCOUNTER — Encounter: Payer: Self-pay | Admitting: Family Medicine

## 2013-11-10 NOTE — Progress Notes (Signed)
Patient ID: Janet [name], female    DOB: 03/01/1965, 48 y.o.   MRN: [id number]  PCP: Dr. Coralyn Mark Shoulder Pain   48 yo F here for left shoulder pain.  07/18/12: Patient denies known injury. States she's had left > right shoulder pain for about 2 years now. Has not done anything other than take motrin as needed for this. Used to bother her only with motions but has worsened to now bothering her at rest. Not done PT or home exercise program for this. Both shoulders feel weak. Some night pain. Pain level 4/10 but has improved over past few days. Is right handed.  11/06/13: Patient reports she's continued to have pain since last visit. Has been taking motrin. Arms feel cold and getting numbness into both of them though. No swelling. No new injuries. Arms also feel weak throughout. 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  Past Medical History  Diagnosis Date  . Arthritis   . Depression   . Hypertension   . Hyperlipidemia   . Goiter, nontoxic, multinodular   . Obesity   . Hemorrhoids     Current Outpatient Prescriptions on File Prior to Visit  Medication Sig Dispense Refill  . buPROPion (WELLBUTRIN SR) 150 MG 12 hr tablet Take 1 tablet (150 mg total) by mouth daily.  30 tablet  0  . cholecalciferol (VITAMIN D) 1000 UNITS tablet Take 5,000 Units by mouth daily.       Marland Kitchen GAVILYTE-N WITH FLAVOR PACK 420 G solution       . lisinopril-hydrochlorothiazide (PRINZIDE,ZESTORETIC) 10-12.5 MG per tablet TAKE 1 TABLET BY MOUTH DAILY  90 tablet  2  . metoprolol succinate (TOPROL-XL) 25 MG 24 hr tablet Take 1 tablet (25 mg total) by mouth daily.  90 tablet  3  . sertraline (ZOLOFT) 50 MG tablet Take 2 tablets nightly  60 tablet  0  . vitamin E 400 UNIT capsule Take 400 Units by mouth daily.       No current facility-administered medications on file prior to visit.    Past Surgical History  Procedure Laterality Date  . Appendectomy  7/10  . Cesarean section    . Mcbride bunionectomy Right  04/03/2013    @ [place]  . Achilles tendon lengthening Right 04/03/2013    @ [place]  . Cotton osteotomy w/ bone graft Right 04/03/2013    @ [place]    No Known Allergies  History   Social History  . Marital Status: Divorced    Spouse Name: N/A    Number of Children: N/A  . Years of Education: N/A   Occupational History  . Not on file.   Social History Main Topics  . Smoking status: Never Smoker   . Smokeless tobacco: Never Used  . Alcohol Use: No  . Drug Use: No  . Sexual Activity: Not Currently   Other Topics Concern  . Not on file   Social History Narrative  . No narrative on file    Family History  Problem Relation Age of Onset  . Diabetes Mother   . Hypertension Father   . Hypertension Brother   . Asthma Sister   . COPD Maternal Grandmother   . Diabetes Paternal Grandmother   . Heart attack Neg Hx   . Hyperlipidemia Neg Hx   . Sudden death Neg Hx     BP 125/81  Pulse 71  Ht 5\' 4"  (1.626 m)  Wt 230 lb (104.327 kg)  BMI 39.46 kg/m2  LMP 10/15/2013  Review of Systems  See HPI above.    Objective:   Physical Exam Gen: NAD  L shoulder: No swelling, ecchymoses.  No gross deformity. No TTP AC or biceps tendon. FROM with mild painful arc. Positive Hawkins, Neers. Negative Speeds, Yergasons. Strength 5/5 with empty can and resisted internal/external rotation.  Mild pain with empty can, less ER. Negative apprehension. NV intact distally.  R shoulder: No swelling, ecchymoses.  No gross deformity. No TTP. FROM. Negative Hawkins, Neers. Negative Speeds, Yergasons. Strength 5/5 with empty can and resisted internal/external rotation. Negative apprehension. NV intact distally.    Assessment & Plan:  1. Bilateral shoulder pain - 2/2 rotator cuff impingement though having new symptoms of subjective weakness and numbness into both upper extremities.  Will start with physical therapy and home exercises.  Reassess in 4-6 weeks.  Consider neurology referral depending  on her repeat exam and symptoms.

## 2013-11-10 NOTE — Assessment & Plan Note (Signed)
2/2 rotator cuff impingement though having new symptoms of subjective weakness and numbness into both upper extremities.  Will start with physical therapy and home exercises.  Reassess in 4-6 weeks.  Consider neurology referral depending on her repeat exam and symptoms.

## 2013-11-13 ENCOUNTER — Other Ambulatory Visit: Payer: Self-pay | Admitting: Internal Medicine

## 2013-11-13 DIAGNOSIS — Z1231 Encounter for screening mammogram for malignant neoplasm of breast: Secondary | ICD-10-CM

## 2013-11-18 ENCOUNTER — Ambulatory Visit: Payer: Managed Care, Other (non HMO) | Attending: Family Medicine | Admitting: Physical Therapy

## 2013-11-18 DIAGNOSIS — M25519 Pain in unspecified shoulder: Secondary | ICD-10-CM | POA: Insufficient documentation

## 2013-11-18 DIAGNOSIS — M6281 Muscle weakness (generalized): Secondary | ICD-10-CM | POA: Diagnosis not present

## 2013-11-18 DIAGNOSIS — R293 Abnormal posture: Secondary | ICD-10-CM | POA: Insufficient documentation

## 2013-11-18 DIAGNOSIS — IMO0001 Reserved for inherently not codable concepts without codable children: Secondary | ICD-10-CM | POA: Insufficient documentation

## 2013-11-18 DIAGNOSIS — M542 Cervicalgia: Secondary | ICD-10-CM | POA: Insufficient documentation

## 2013-11-27 ENCOUNTER — Encounter: Payer: Self-pay | Admitting: Dietician

## 2013-11-27 ENCOUNTER — Encounter: Payer: Managed Care, Other (non HMO) | Attending: Internal Medicine | Admitting: Dietician

## 2013-11-27 VITALS — Ht 64.0 in | Wt 237.9 lb

## 2013-11-27 DIAGNOSIS — E669 Obesity, unspecified: Secondary | ICD-10-CM | POA: Insufficient documentation

## 2013-11-27 DIAGNOSIS — Z713 Dietary counseling and surveillance: Secondary | ICD-10-CM | POA: Insufficient documentation

## 2013-11-27 DIAGNOSIS — Z6841 Body Mass Index (BMI) 40.0 and over, adult: Secondary | ICD-10-CM | POA: Insufficient documentation

## 2013-11-27 NOTE — Patient Instructions (Addendum)
-  Avoid skipping meals  -Have something to eat every 3-5 hours  -Get back to seeing your counselor when you can  -Fill up on non-starchy vegetables  -Buddy up with Marissa and try new recipes  Meals:  -Mostly vegetables (raw or cooked, fresh or frozen)  -Lean meat: lean ground beef or [place], chicken, tilapia  -Limit starches to about 1/2 a cup  -Keep taking a list to the grocery store!  -Increase physical activity!  -Brainstorm some activities that you enjoy  Healthy weight loss: 1-2 lbs per week

## 2013-11-27 NOTE — Progress Notes (Signed)
  Medical Nutrition Therapy:  Appt start time: 1110 end time:  1210.   Assessment:  Primary concerns today: Janet [name] is here today because of prediabetes diagnosis. She has 2 daughters (ages 54 and 60) who are "very health-conscious." Her husband left her 3 years ago and she began taking antidepressants. Since weaning off of her antidepressants she reports feeling better. However, she reports bouts of dizziness, disorientation, and weakness with ongoing fatigue. 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 She has a history of not having enough to eat during childhood and has seen a counselor previously. Her daughter is currently in counseling for eating issues and depression and Florence reports she cannot afford counseling for herself at this time.    Preferred Learning Style:   No preference indicated   Learning Readiness:   Ready  MEDICATIONS: see list   DIETARY INTAKE:  Usual eating pattern includes 1-2 meals per day.  Avoided foods include seafood unless it's fried.    24-hr recall:  B ( AM): none (tea or coffee)  Snk ( AM):   L ( PM): 6in chicken sub or McDonald's (sometimes skips) Snk ( PM):  D ( PM): O'Charleys, [place]horn salad, can of soup Snk ( PM):   Beverages: tea or coffee with cream and sugar, water, chocolate milk  Usual physical activity: none  Estimated energy needs: 1500-1600 calories 170-180 g carbohydrates  Progress Towards Goal(s):  In progress.   Nutritional Diagnosis:  [place]-2.2 Altered nutrition-related laboratory As related to excessive carbohydrate intake, physical inactivity, and family history of diabetes.  As evidenced by prediabetes diagnosis with HgbA1c of 6.2%.    Intervention:  Nutrition counseling provided. Discussed importance of regular meals. Encouraged protein and small amounts of carbohydrates. Goals: -Avoid skipping meals  -Have something to eat every 3-5 hours -Get back to seeing your counselor when you can -Fill up on non-starchy  vegetables -Buddy up with Marissa and try new recipes Meals:  -Mostly vegetables (raw or cooked, fresh or frozen)  -Lean meat: lean ground beef or [place], chicken, tilapia  -Limit starches to about 1/2 a cup -Keep taking a list to the grocery store! -Increase physical activity!  -Brainstorm some activities that you enjoy Healthy weight loss: 1-2 lbs per week  Teaching Method Utilized:  Visual Auditory  Handouts given during visit include:  MyPlate  94B CHO + protein snacks  Bake, broil, grill  Low sodium flavoring tips  Barriers to learning/adherence to lifestyle change: family stress, food preferences, financial constraints  Demonstrated degree of understanding via:  Teach Back   Monitoring/Evaluation:  Dietary intake, exercise, and body weight prn.

## 2013-12-02 ENCOUNTER — Ambulatory Visit: Payer: Managed Care, Other (non HMO) | Attending: Family Medicine | Admitting: Physical Therapy

## 2013-12-02 DIAGNOSIS — M542 Cervicalgia: Secondary | ICD-10-CM | POA: Diagnosis not present

## 2013-12-02 DIAGNOSIS — M25512 Pain in left shoulder: Secondary | ICD-10-CM | POA: Insufficient documentation

## 2013-12-02 DIAGNOSIS — M25511 Pain in right shoulder: Secondary | ICD-10-CM | POA: Insufficient documentation

## 2013-12-02 DIAGNOSIS — M6281 Muscle weakness (generalized): Secondary | ICD-10-CM | POA: Insufficient documentation

## 2013-12-02 DIAGNOSIS — Z5189 Encounter for other specified aftercare: Secondary | ICD-10-CM | POA: Diagnosis not present

## 2013-12-02 DIAGNOSIS — R293 Abnormal posture: Secondary | ICD-10-CM | POA: Diagnosis not present

## 2013-12-05 ENCOUNTER — Other Ambulatory Visit: Payer: Self-pay | Admitting: Internal Medicine

## 2013-12-05 NOTE — Telephone Encounter (Signed)
Refill Request.  

## 2013-12-10 ENCOUNTER — Ambulatory Visit (INDEPENDENT_AMBULATORY_CARE_PROVIDER_SITE_OTHER): Payer: Managed Care, Other (non HMO) | Admitting: Internal Medicine

## 2013-12-10 ENCOUNTER — Encounter: Payer: Self-pay | Admitting: Internal Medicine

## 2013-12-10 VITALS — BP 120/73 | HR 73 | Temp 98.8°F | Resp 16 | Ht 64.0 in | Wt 244.0 lb

## 2013-12-10 DIAGNOSIS — R7301 Impaired fasting glucose: Secondary | ICD-10-CM

## 2013-12-10 DIAGNOSIS — M546 Pain in thoracic spine: Secondary | ICD-10-CM

## 2013-12-10 DIAGNOSIS — R7309 Other abnormal glucose: Secondary | ICD-10-CM

## 2013-12-10 LAB — GLUCOSE, POCT (MANUAL RESULT ENTRY): POC Glucose: 101 mg/dL — AB (ref 70–99)

## 2013-12-10 NOTE — Progress Notes (Signed)
   Subjective:    Patient ID: Janet [name], female    DOB: 1965/09/27, 48 y.o.   MRN: [id number]  HPI  10/2013  Sport medicine note Assessment & Plan:    1. Bilateral shoulder pain - 2/2 rotator cuff impingement though having new symptoms of subjective weakness and numbness into both upper extremities.  Will start with physical therapy and home exercises.  Reassess in 4-6 weeks.  Consider neurology referral depending on her repeat exam and symptoms.            Bilateral shoulder pain - Janet Gentry, MD at 11/10/2013  1:08 PM      Status: Written Related Problem: Bilateral shoulder pain    2/2 rotator cuff impingement though having new symptoms of subjective weakness and    09/2013 my visit Borderline DM Will set up referral to diabetes center for low carb and weight loss Low fat diet  Hyperliidemia She does not wish Rx now Will try diet and exercise  Obesity: [facility]. She is to let me know if she wants referral v See me in November   Today    Borderline DM  Janet [name] has been watching her diet and has had consult with Dr. Theresia Majors for breast reduction surgery.    She has seen nutritionist   She has been to PT and they verify that her back discomfort is related to very large breast.  Janet [name] also gets frequent tinea infections related to large breast size      Review of Systems See HPI    Objective:   Physical Exam  Physical Exam  Nursing note and vitals reviewed.  Constitutional: She is oriented to person, place, and time. She appears well-developed and well-nourished.  HENT:  Head: Normocephalic and atraumatic.  Cardiovascular: Normal rate and regular rhythm. Exam reveals no gallop and no friction rub.  No murmur heard.  Pulmonary/Chest: Breath sounds normal. She has no wheezes. She has no rales.   She does have large pendulous breasts  Neurological: She is alert and oriented to person, place, and time.  Skin: Skin is  warm and dry.  Psychiatric: She has a normal mood and affect. Her behavior is normal.        Assessment & Plan:  Back discomfort   I agree that large pendulous breasts are related to her back discomfort and that surgery would be beneficial to her  Borderline DM  accucheck today  101   Obesity given copy of 3 day diet

## 2013-12-10 NOTE — Patient Instructions (Signed)
See me as needed 

## 2013-12-11 ENCOUNTER — Ambulatory Visit: Payer: Managed Care, Other (non HMO) | Admitting: Family Medicine

## 2013-12-22 ENCOUNTER — Ambulatory Visit (HOSPITAL_BASED_OUTPATIENT_CLINIC_OR_DEPARTMENT_OTHER)
Admission: RE | Admit: 2013-12-22 | Discharge: 2013-12-22 | Disposition: A | Discharge status: Home / Self Care | Payer: Managed Care, Other (non HMO) | Source: Ambulatory Visit | Attending: Internal Medicine | Admitting: Internal Medicine

## 2013-12-22 ENCOUNTER — Telehealth: Payer: Self-pay

## 2013-12-22 ENCOUNTER — Encounter: Payer: Self-pay | Admitting: Internal Medicine

## 2013-12-22 DIAGNOSIS — Z1231 Encounter for screening mammogram for malignant neoplasm of breast: Secondary | ICD-10-CM | POA: Diagnosis not present

## 2013-12-22 NOTE — Telephone Encounter (Signed)
Marsha called to give you phone numbers for Dr Mayer Camel in [facility] - Fax number she got was [phone] Phone number is [phone] and another fax number is [phone]

## 2013-12-22 NOTE — Telephone Encounter (Signed)
Faxed office note to Dr. Delice Lesch.-eh

## 2013-12-25 ENCOUNTER — Encounter: Payer: Self-pay | Admitting: Pulmonary Disease

## 2013-12-25 ENCOUNTER — Ambulatory Visit (INDEPENDENT_AMBULATORY_CARE_PROVIDER_SITE_OTHER): Payer: Managed Care, Other (non HMO) | Admitting: Pulmonary Disease

## 2013-12-25 VITALS — BP 126/81 | HR 79 | Temp 98.0°F | Ht 64.0 in | Wt 242.0 lb

## 2013-12-25 DIAGNOSIS — G4733 Obstructive sleep apnea (adult) (pediatric): Secondary | ICD-10-CM

## 2013-12-25 NOTE — Progress Notes (Signed)
Subjective:    Patient ID: Janet [name], female    DOB: [birth year], 48 y.o.   MRN: [id number]  HPI  PCP- Schoenoff  48 year old obese woman presents for evaluation of sleep-disordered breathing. She reports loud snoring that has been noted by family members, her daughters close the door and she sleeps. She also reports excessive daytime somnolence and fatigue. Epworth sleepiness score is 12/24. Bedtime is around 11 PM, which is often dosing on the couch prior to that, sleep latency is minutes, she sleeps on her side with one to 3 pillows, reports frequent nocturnal awakenings, without any post void sleep latency and is out of bed by 6 AM feeling tired and sleepy without dryness of mouth. Her weight has fluctuated only 10 pounds from around 235. She has stopped her antidepressants including Wellbutrin. 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 She denies problems driving.  Past Medical History  Diagnosis Date  . Arthritis   . Depression   . Hypertension   . Hyperlipidemia   . Goiter, nontoxic, multinodular   . Obesity   . Hemorrhoids     Past Surgical History  Procedure Laterality Date  . Appendectomy  7/10  . Cesarean section    . Mcbride bunionectomy Right 04/03/2013    @ [place]  . Achilles tendon lengthening Right 04/03/2013    @ [place]  . Cotton osteotomy w/ bone graft Right 04/03/2013    @ [place]    No Known Allergies  History   Social History  . Marital Status: Divorced    Spouse Name: N/A    Number of Children: N/A  . Years of Education: N/A   Occupational History  . Not on file.   Social History Main Topics  . Smoking status: Never Smoker   . Smokeless tobacco: Never Used  . Alcohol Use: No  . Drug Use: No  . Sexual Activity: Not Currently   Other Topics Concern  . Not on file   Social History Narrative    Family History  Problem  Relation Age of Onset  . Diabetes Mother   . Hypertension Father   . Hypertension Brother   . Asthma Sister   . COPD Maternal Grandmother   . Diabetes Paternal Grandmother   . Heart attack Neg Hx   . Hyperlipidemia Neg Hx   . Sudden death Neg Hx   . Diabetes Maternal Grandfather   . Stroke Paternal Grandfather      Review of Systems  Constitutional: Negative for fever and unexpected weight change.  HENT: Negative for congestion, dental problem, ear pain, nosebleeds, postnasal drip, rhinorrhea, sinus pressure, sneezing, sore throat and trouble swallowing.   Eyes: Negative for redness and itching.  Respiratory: Negative for cough, chest tightness, shortness of breath and wheezing.   Cardiovascular: Negative for palpitations and leg swelling.  Gastrointestinal: Negative for nausea and vomiting.  Genitourinary: Negative for dysuria.  Musculoskeletal: Negative for joint swelling.  Skin: Negative for rash.  Neurological: Negative for headaches.  Hematological: Does not bruise/bleed easily.  Psychiatric/Behavioral: Positive for dysphoric mood. The patient is not nervous/anxious.        Objective:   Physical Exam  Gen. Pleasant, obese, in no distress, normal affect ENT - no lesions, no post nasal drip, class 2-3 airway Neck: No JVD, no thyromegaly, no carotid bruits Lungs: no use of accessory muscles, no dullness to percussion, decreased without rales or rhonchi  Cardiovascular: Rhythm regular, heart sounds  normal, no murmurs or gallops, no peripheral edema Abdomen: soft and non-tender, no hepatosplenomegaly, BS normal. Musculoskeletal: No deformities, no cyanosis or clubbing Neuro:  alert, non focal, no tremors       Assessment & Plan:

## 2013-12-25 NOTE — Assessment & Plan Note (Signed)
Given excessive daytime somnolence, narrow pharyngeal exam, witnessed apneas & loud snoring, obstructive sleep apnea is very likely & an overnight polysomnogram will be scheduled as a home study. The pathophysiology of obstructive sleep apnea , it's cardiovascular consequences & modes of treatment including CPAP were discused with the patient in detail & they evidenced understanding. Home sleep study If shows obstructive sleep apnea , will need CPAP titration study in our sleep center

## 2013-12-25 NOTE — Patient Instructions (Signed)
Home sleep study If shows obstructive sleep apnea , will need CPAP titration study in our sleep center

## 2013-12-29 ENCOUNTER — Ambulatory Visit: Payer: Managed Care, Other (non HMO) | Admitting: Family Medicine

## 2013-12-31 ENCOUNTER — Ambulatory Visit: Payer: Managed Care, Other (non HMO) | Admitting: Internal Medicine

## 2014-01-01 DIAGNOSIS — G4733 Obstructive sleep apnea (adult) (pediatric): Secondary | ICD-10-CM

## 2014-01-06 ENCOUNTER — Telehealth: Payer: Self-pay | Admitting: Pulmonary Disease

## 2014-01-06 ENCOUNTER — Ambulatory Visit: Payer: Managed Care, Other (non HMO) | Attending: Family Medicine | Admitting: Physical Therapy

## 2014-01-06 DIAGNOSIS — M25511 Pain in right shoulder: Secondary | ICD-10-CM | POA: Insufficient documentation

## 2014-01-06 DIAGNOSIS — M6281 Muscle weakness (generalized): Secondary | ICD-10-CM | POA: Diagnosis not present

## 2014-01-06 DIAGNOSIS — R293 Abnormal posture: Secondary | ICD-10-CM | POA: Diagnosis not present

## 2014-01-06 DIAGNOSIS — Z5189 Encounter for other specified aftercare: Secondary | ICD-10-CM | POA: Insufficient documentation

## 2014-01-06 DIAGNOSIS — M542 Cervicalgia: Secondary | ICD-10-CM | POA: Insufficient documentation

## 2014-01-06 DIAGNOSIS — G4733 Obstructive sleep apnea (adult) (pediatric): Secondary | ICD-10-CM

## 2014-01-06 DIAGNOSIS — M25512 Pain in left shoulder: Secondary | ICD-10-CM | POA: Insufficient documentation

## 2014-01-06 NOTE — Telephone Encounter (Signed)
HST showed mod OSA- stopped breathing 17/h Schedule CPAP titration study if pt willing

## 2014-01-06 NOTE — Telephone Encounter (Signed)
Lmtcb. 11/17

## 2014-01-07 ENCOUNTER — Encounter: Payer: Self-pay | Admitting: Pulmonary Disease

## 2014-01-07 NOTE — Telephone Encounter (Signed)
[employer] faxed form with below information. Gave to Dr. Elsworth Soho, who has approved the CPAP Titration Study at home. Form faxed back to [employer] Attn: Anderson Malta B that home cpap titration study will be ordered. Dr. Elsworth Soho to place referral for DME CPAP Auto Titration. Rhonda J Cobb

## 2014-01-07 NOTE — Telephone Encounter (Signed)
I ordered auto c Pap-please let patient know

## 2014-01-07 NOTE — Telephone Encounter (Signed)
Janet [name] scheduled cpap titation study.  Janet [name] with [employer] [facility] said patient does not meet medical necessity.  Requesting patient get home cpap titration.

## 2014-01-07 NOTE — Telephone Encounter (Signed)
Patient notified.  Nothing further needed. 

## 2014-01-07 NOTE — Telephone Encounter (Signed)
Dr Elsworth Soho, please advise if you are okay with 'home' AUTO CPAP titration study being ordered. Thanks.

## 2014-01-07 NOTE — Telephone Encounter (Signed)
[employer] refused titration study  Plan for autoCPAP

## 2014-01-09 ENCOUNTER — Encounter (HOSPITAL_BASED_OUTPATIENT_CLINIC_OR_DEPARTMENT_OTHER): Payer: Managed Care, Other (non HMO)

## 2014-01-11 NOTE — Progress Notes (Signed)
Subjective:    Patient ID: Janet [name], female    DOB: 1965-06-25, 48 y.o.   MRN: [id number]  HPI  11/05  Pulmonary  Assessment & Plan:            Revision History       Date/Time User Action    > 12/25/2013 6:00 PM Rigoberto Noel, MD Sign     12/25/2013 3:40 PM Lilli Few, CMA Sign at close encounter              OSA (obstructive sleep apnea) - Rigoberto Noel, MD at 12/25/2013 4:09 PM     Status: Written Related Problem: OSA (obstructive sleep apnea)   Expand All Collapse All   Given excessive daytime somnolence, narrow pharyngeal exam, witnessed apneas & loud snoring, obstructive sleep apnea is very likely & an overnight polysomnogram will be scheduled as a home study. The pathophysiology of obstructive sleep apnea , it's cardiovascular consequences & modes of treatment including CPAP were discused with the patient in detail & they evidenced understanding. Home sleep study If shows obstructive sleep apnea , will need CPAP titration study in our sleep center       TODAY:  Valerie is here for CPE.      HM:  Colonoscopy 08/2013,  Mm neg Nov  Pap 2014  GYN,  Preslee is a non-smokeir  Breast reduction  Solenne is very frustrated as Dr.Steins office has misplaced her paperwork   No Known Allergies Past Medical History  Diagnosis Date  . Arthritis   . Depression   . Hypertension   . Hyperlipidemia   . Goiter, nontoxic, multinodular   . Obesity   . Hemorrhoids    Past Surgical History  Procedure Laterality Date  . Appendectomy  7/10  . Cesarean section    . Mcbride bunionectomy Right 04/03/2013    @ [place]  . Achilles tendon lengthening Right 04/03/2013    @ [place]  . Cotton osteotomy w/ bone graft Right 04/03/2013    @ [place]   History   Social History  . Marital Status: Divorced    Spouse Name: N/A    Number of Children: N/A  . Years of Education: N/A   Occupational History  . Not on file.   Social History Main Topics  . Smoking  status: Never Smoker   . Smokeless tobacco: Never Used  . Alcohol Use: No  . Drug Use: No  . Sexual Activity: Not Currently   Other Topics Concern  . Not on file   Social History Narrative   Family History  Problem Relation Age of Onset  . Diabetes Mother   . Hypertension Father   . Hypertension Brother   . Asthma Sister   . COPD Maternal Grandmother   . Diabetes Paternal Grandmother   . Heart attack Neg Hx   . Hyperlipidemia Neg Hx   . Sudden death Neg Hx   . Diabetes Maternal Grandfather   . Stroke Paternal Grandfather    Patient Active Problem List   Diagnosis Date Noted  . OSA (obstructive sleep apnea) 12/25/2013  . Colon polyps  colonoscopy done 08/2013 repeat 5 years 09/13/2013  . Foot pain, right 04/29/2013  . Equinus deformity of foot, acquired 03/05/2013  . Metatarsal deformity 03/05/2013  . Bilateral pronation deformity of feet 03/05/2013  . Bilateral shoulder pain 06/13/2012  . Bunion of great toe 06/13/2012  . Uterine fibroid 07/03/2011  . Chest pain 05/04/2011  . Depression   .  Hypertension   . Hyperlipidemia   . Goiter, nontoxic, multinodular   . Obesity   . Hemorrhoids    Current Outpatient Prescriptions on File Prior to Visit  Medication Sig Dispense Refill  . lisinopril-hydrochlorothiazide (PRINZIDE,ZESTORETIC) 10-12.5 MG per tablet TAKE 1 TABLET BY MOUTH DAILY 90 tablet 2  . metoprolol succinate (TOPROL-XL) 25 MG 24 hr tablet TAKE 1 TABLET BY MOUTH DAILY 90 tablet 2  . Probiotic Product (ALIGN) 4 MG CAPS Take 1 capsule by mouth daily.     No current facility-administered medications on file prior to visit.       Review of Systems See H{I    Objective:   Physical Exam Physical Exam  Nursing note and vitals reviewed.  Constitutional: She is oriented to person, place, and time. She appears well-developed and well-nourished.  HENT:  Head: Normocephalic and atraumatic.  Right Ear: Tympanic membrane and ear canal normal. No drainage.  Tympanic membrane is not injected and not erythematous.  Left Ear: Tympanic membrane and ear canal normal. No drainage. Tympanic membrane is not injected and not erythematous.  Nose: Nose normal. Right sinus exhibits no maxillary sinus tenderness and no frontal sinus tenderness. Left sinus exhibits no maxillary sinus tenderness and no frontal sinus tenderness.  Mouth/Throat: Oropharynx is clear and moist. No oral lesions. No oropharyngeal exudate.  Eyes: Conjunctivae and EOM are normal. Pupils are equal, round, and reactive to light.  Neck: Normal range of motion. Neck supple. No JVD present. Carotid bruit is not present. No mass and no thyromegaly present.  Cardiovascular: Normal rate, regular rhythm, S1 normal, S2 normal and intact distal pulses. Exam reveals no gallop and no friction rub.  No murmur heard.  Pulses:  Carotid pulses are 2+ on the right side, and 2+ on the left side.  Dorsalis pedis pulses are 2+ on the right side, and 2+ on the left side.  No carotid bruit. No LE edema  Pulmonary/Chest: Breath sounds normal. She has no wheezes. She has no rales. She exhibits no tenderness.   Breast no discrete mass no nipple discharge no axillary adenopathy  Abdominal: Soft. Bowel sounds are normal. She exhibits no distension and no mass. There is no hepatosplenomegaly. There is no tenderness. There is no CVA tenderness.  Musculoskeletal: Normal range of motion.  No active synovitis to joints.  Lymphadenopathy:  She has no cervical adenopathy.  She has no axillary adenopathy.  Right: No inguinal and no supraclavicular adenopathy present.  Left: No inguinal and no supraclavicular adenopathy present.  Neurological: She is alert and oriented to person, place, and time. She has normal strength and normal reflexes. She displays no tremor. No cranial nerve deficit or sensory deficit. Coordination and gait normal.  Skin: Skin is warm and dry. No rash noted. No cyanosis. Nails show no clubbing.    Psychiatric: She has a normal mood and affect. Her speech is normal and behavior is normal. Cognition and memory are normal.        Assessment & Plan:  HM:   UTD see scanned sheet    Colonoscopy done 08/2013,  Mm neg,  She is anon-smoker  htn continue meds  Hyperlipidemia  Will check today with chemistries  MNG  Managed by Dr. Chalmers Cater   Depression  Continue meds

## 2014-01-12 ENCOUNTER — Ambulatory Visit (INDEPENDENT_AMBULATORY_CARE_PROVIDER_SITE_OTHER): Payer: Managed Care, Other (non HMO) | Admitting: Internal Medicine

## 2014-01-12 ENCOUNTER — Encounter: Payer: Self-pay | Admitting: Internal Medicine

## 2014-01-12 VITALS — BP 116/75 | HR 63 | Temp 98.4°F | Resp 16 | Ht 64.0 in | Wt 239.0 lb

## 2014-01-12 DIAGNOSIS — Z0001 Encounter for general adult medical examination with abnormal findings: Secondary | ICD-10-CM

## 2014-01-12 DIAGNOSIS — R7309 Other abnormal glucose: Secondary | ICD-10-CM

## 2014-01-12 DIAGNOSIS — I10 Essential (primary) hypertension: Secondary | ICD-10-CM

## 2014-01-12 DIAGNOSIS — F329 Major depressive disorder, single episode, unspecified: Secondary | ICD-10-CM

## 2014-01-12 DIAGNOSIS — E785 Hyperlipidemia, unspecified: Secondary | ICD-10-CM

## 2014-01-12 LAB — LIPID PANEL
CHOLESTEROL: 205 mg/dL — AB (ref 0–200)
HDL: 55 mg/dL (ref >39)
LDL CALC: 127 mg/dL — AB (ref 0–99)
TRIGLYCERIDES: 115 mg/dL (ref <150)
Total CHOL/HDL Ratio: 3.7 Ratio
VLDL: 23 mg/dL (ref 0–40)

## 2014-01-12 LAB — HEMOGLOBIN A1C
Hgb A1c MFr Bld: 5.9 % — ABNORMAL HIGH (ref <5.7)
Mean Plasma Glucose: 123 mg/dL — ABNORMAL HIGH (ref <117)

## 2014-01-12 LAB — COMPLETE METABOLIC PANEL WITH GFR
ALT: 11 U/L (ref 0–35)
AST: 18 U/L (ref 0–37)
Albumin: 3.9 g/dL (ref 3.5–5.2)
Alkaline Phosphatase: 111 U/L (ref 39–117)
BILIRUBIN TOTAL: 0.5 mg/dL (ref 0.2–1.2)
BUN: 10 mg/dL (ref 6–23)
CALCIUM: 9.5 mg/dL (ref 8.4–10.5)
CHLORIDE: 101 meq/L (ref 96–112)
CO2: 27 meq/L (ref 19–32)
CREATININE: 0.81 mg/dL (ref 0.50–1.10)
GFR, EST NON AFRICAN AMERICAN: 85 mL/min
Glucose, Bld: 89 mg/dL (ref 70–99)
Potassium: 4 mEq/L (ref 3.5–5.3)
Sodium: 138 mEq/L (ref 135–145)
Total Protein: 7.2 g/dL (ref 6.0–8.3)

## 2014-01-12 NOTE — Patient Instructions (Signed)
See me as needed  To lab today

## 2014-01-13 ENCOUNTER — Ambulatory Visit: Payer: Managed Care, Other (non HMO) | Admitting: Physical Therapy

## 2014-01-13 NOTE — Progress Notes (Signed)
Janet [name] is aware of her lab results. I also mailed a copy to her home.-eh

## 2014-01-19 ENCOUNTER — Ambulatory Visit: Payer: Managed Care, Other (non HMO) | Admitting: Family Medicine

## 2014-01-20 ENCOUNTER — Ambulatory Visit: Payer: Managed Care, Other (non HMO) | Attending: Family Medicine | Admitting: Physical Therapy

## 2014-01-20 DIAGNOSIS — R293 Abnormal posture: Secondary | ICD-10-CM | POA: Insufficient documentation

## 2014-01-20 DIAGNOSIS — M25512 Pain in left shoulder: Secondary | ICD-10-CM | POA: Diagnosis not present

## 2014-01-20 DIAGNOSIS — M25511 Pain in right shoulder: Secondary | ICD-10-CM | POA: Diagnosis not present

## 2014-01-20 DIAGNOSIS — M6281 Muscle weakness (generalized): Secondary | ICD-10-CM | POA: Insufficient documentation

## 2014-01-20 DIAGNOSIS — M542 Cervicalgia: Secondary | ICD-10-CM | POA: Insufficient documentation

## 2014-01-20 DIAGNOSIS — Z5189 Encounter for other specified aftercare: Secondary | ICD-10-CM | POA: Insufficient documentation

## 2014-02-02 ENCOUNTER — Ambulatory Visit: Payer: Managed Care, Other (non HMO) | Admitting: Physical Therapy

## 2014-02-02 DIAGNOSIS — Z5189 Encounter for other specified aftercare: Secondary | ICD-10-CM | POA: Diagnosis not present

## 2014-02-04 ENCOUNTER — Encounter: Payer: Self-pay | Admitting: Emergency Medicine

## 2014-02-04 ENCOUNTER — Emergency Department (INDEPENDENT_AMBULATORY_CARE_PROVIDER_SITE_OTHER): Payer: Managed Care, Other (non HMO)

## 2014-02-04 ENCOUNTER — Emergency Department (INDEPENDENT_AMBULATORY_CARE_PROVIDER_SITE_OTHER)
Admission: EM | Admit: 2014-02-04 | Discharge: 2014-02-04 | Disposition: A | Discharge status: Home / Self Care | Payer: Managed Care, Other (non HMO) | Source: Home / Self Care | Attending: Family Medicine | Admitting: Family Medicine

## 2014-02-04 ENCOUNTER — Telehealth: Payer: Self-pay

## 2014-02-04 DIAGNOSIS — M25562 Pain in left knee: Secondary | ICD-10-CM

## 2014-02-04 DIAGNOSIS — M1712 Unilateral primary osteoarthritis, left knee: Secondary | ICD-10-CM

## 2014-02-04 MED ORDER — MELOXICAM 15 MG PO TABS: 15.0000 mg | ORAL_TABLET | Freq: Every day | ORAL | Status: DC

## 2014-02-04 MED ORDER — TRAMADOL HCL 50 MG PO TABS: 50.0000 mg | ORAL_TABLET | Freq: Every evening | ORAL | Status: DC | PRN

## 2014-02-04 NOTE — Telephone Encounter (Signed)
Janet [name] [phone] Hershal Coria Jerilynn Mages)  Maiana called having extreme pain in her left knee, she can put very little weight on it. I asked Dr Coralyn Mark and she suggested for [facility] to go to Urgent Care so they can do Xrays to see what is going on. Patient acknowledge understanding and said she was going to get off of it this afternoon and put ice on it and if not better by morning she would go to Urgent Care.

## 2014-02-04 NOTE — ED Notes (Signed)
Reports excruciating left knee pain today with inability to bear weight. Did PT 3 days ago, including squats, and wonders if this could be related. Pain radiates both to upper and lower leg.

## 2014-02-04 NOTE — Discharge Instructions (Signed)
Apply ice pack for 20 to 30 minutes, 3 to 4 times daily  Continue until pain decreases.  Wear ace wrap daytime until swelling decreases.  Use crutches for 3 to 5 days.

## 2014-02-04 NOTE — ED Provider Notes (Signed)
CSN: [id number]     Arrival date & time 02/04/14  1713 History   First MD Initiated Contact with Patient 02/04/14 1806     Chief Complaint  Patient presents with  . Knee Pain      HPI Comments: Today while arising from a chair at about 8:30am patient experienced sudden onset of pain in her left knee that has persisted.  The pain is worse when walking.  She recalls no recent injury; she states that she did PT three days ago that included squats.  She had a left knee injury skiing 25 years ago.  Patient is a 48 y.o. female presenting with knee pain. The history is provided by the patient.  Knee Pain Location:  Knee Time since incident:  8 hours Knee location:  L knee Pain details:    Quality:  Aching   Radiates to:  Does not radiate   Severity:  Moderate   Onset quality:  Sudden   Duration:  8 hours   Timing:  Constant   Progression:  Unchanged Chronicity:  New Prior injury to area:  Yes Relieved by:  Nothing Worsened by:  Bearing weight Ineffective treatments:  NSAIDs Associated symptoms: decreased ROM, stiffness and swelling   Associated symptoms: no back pain and no muscle weakness   Risk factors: obesity     Past Medical History  Diagnosis Date  . Arthritis   . Depression   . Hypertension   . Hyperlipidemia   . Goiter, nontoxic, multinodular   . Obesity   . Hemorrhoids    Past Surgical History  Procedure Laterality Date  . Appendectomy  7/10  . Cesarean section    . Mcbride bunionectomy Right 04/03/2013    @ [place]  . Achilles tendon lengthening Right 04/03/2013    @ [place]  . Cotton osteotomy w/ bone graft Right 04/03/2013    @ [place]   Family History  Problem Relation Age of Onset  . Diabetes Mother   . Hypertension Father   . Hypertension Brother   . Asthma Sister   . COPD Maternal Grandmother   . Diabetes Paternal Grandmother   . Heart attack Neg Hx   . Hyperlipidemia Neg Hx   . Sudden death Neg Hx   . Diabetes Maternal Grandfather   . Stroke Paternal  Grandfather    History  Substance Use Topics  . Smoking status: Never Smoker   . Smokeless tobacco: Never Used  . Alcohol Use: No   OB History    Gravida Para Term Preterm AB TAB SAB Ectopic Multiple Living   3 2   1  1         Review of Systems  Musculoskeletal: Positive for stiffness. Negative for back pain.  All other systems reviewed and are negative.   Allergies  Review of patient's allergies indicates no known allergies.  Home Medications   Prior to Admission medications   Medication Sig Start Date End Date Taking? Authorizing Provider  lisinopril-hydrochlorothiazide (PRINZIDE,ZESTORETIC) 10-12.5 MG per tablet TAKE 1 TABLET BY MOUTH DAILY 08/25/13   Lanice Shirts, MD  meloxicam (MOBIC) 15 MG tablet Take 1 tablet (15 mg total) by mouth daily. Take with food each morning 02/04/14   Kandra Nicolas, MD  metoprolol succinate (TOPROL-XL) 25 MG 24 hr tablet TAKE 1 TABLET BY MOUTH DAILY 12/05/13   Lanice Shirts, MD  Probiotic Product (ALIGN) 4 MG CAPS Take 1 capsule by mouth daily.    Historical Provider, MD   BP 127/70  mmHg  Pulse 95  Temp(Src) 98.7 F (37.1 C)  Resp 16  Ht 5\' 4"  (1.626 m)  Wt 230 lb (104.327 kg)  BMI 39.46 kg/m2  SpO2 97%  LMP 01/12/2014 Physical Exam  Constitutional: She is oriented to person, place, and time. She appears well-developed and well-nourished. No distress.  Patient is obese (BMI 39.5)  Eyes: Pupils are equal, round, and reactive to light.  Musculoskeletal:       Left knee: She exhibits normal range of motion, no swelling, no effusion, no ecchymosis, no deformity, no laceration, no erythema, normal alignment, no LCL laxity and normal patellar mobility. Tenderness found. Medial joint line and MCL tenderness noted. No lateral joint line, no LCL and no patellar tendon tenderness noted.       Legs: Left knee has maximal tenderness over the medial joint line.  Knee stable.  Negative McMurray test.  Neurological: She is alert and  oriented to person, place, and time.  Skin: Skin is warm and dry.  Nursing note and vitals reviewed.   ED Course  Procedures  none    Imaging Review Dg Knee Complete 4 Views Left  02/04/2014   CLINICAL DATA:  Pain in the medial and lateral left knee. No injury.  EXAM: LEFT KNEE - COMPLETE 4+ VIEW  COMPARISON:  None.  FINDINGS: Osteophytes in the medial and lateral knee compartments. Mild degenerative changes at patellofemoral compartment. There may be a small suprapatellar joint effusion. Negative for acute fracture or dislocation.  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 No acute bone abnormality.   Electronically Signed   By: Markus Daft M.D.   On: 02/04/2014 18:50     MDM   1. Knee pain, acute, left; suspect osteoarthritis    Ace wrap applied.  Begin Mobic.  Tramadol 50mg  at bedtime prn Apply ice pack for 20 to 30 minutes, 3 to 4 times daily  Continue until pain decreases.  Wear ace wrap daytime until swelling decreases.  Use crutches for 3 to 5 days.    Kandra Nicolas, MD 02/10/14 [phone]

## 2014-03-10 ENCOUNTER — Ambulatory Visit (INDEPENDENT_AMBULATORY_CARE_PROVIDER_SITE_OTHER): Payer: Managed Care, Other (non HMO) | Admitting: *Deleted

## 2014-03-10 ENCOUNTER — Telehealth: Payer: Self-pay | Admitting: Pulmonary Disease

## 2014-03-10 DIAGNOSIS — Z23 Encounter for immunization: Secondary | ICD-10-CM

## 2014-03-10 NOTE — Telephone Encounter (Signed)
Pl  arrange follow-up office visit with me next week or with TP

## 2014-03-10 NOTE — Telephone Encounter (Signed)
LMTCB

## 2014-03-11 NOTE — Telephone Encounter (Signed)
lmtcb

## 2014-03-11 NOTE — Telephone Encounter (Signed)
Patient scheduled to see Dr. Elsworth Soho Thursday 1/28

## 2014-03-19 ENCOUNTER — Encounter: Payer: Self-pay | Admitting: Pulmonary Disease

## 2014-03-19 ENCOUNTER — Ambulatory Visit (INDEPENDENT_AMBULATORY_CARE_PROVIDER_SITE_OTHER): Payer: Managed Care, Other (non HMO) | Admitting: Pulmonary Disease

## 2014-03-19 VITALS — BP 100/64 | HR 76 | Ht 64.0 in | Wt 244.0 lb

## 2014-03-19 DIAGNOSIS — G4733 Obstructive sleep apnea (adult) (pediatric): Secondary | ICD-10-CM

## 2014-03-19 NOTE — Patient Instructions (Signed)
Will set your CPAP to 9 cm Check report in 1 month & we will give you feedback Goal is 6h usage/ night We discussed CPAP maintenance & supplies Call for problems

## 2014-03-19 NOTE — Assessment & Plan Note (Signed)
Will set your CPAP to 9 cm Check report in 1 month & we will give you feedback Goal is 6h usage/ night We discussed CPAP maintenance & supplies Call for problems  Weight loss encouraged, compliance with goal of at least 4-6 hrs every night is the expectation. Advised against medications with sedative side effects Cautioned against driving when sleepy - understanding that sleepiness will vary on a day to day basis

## 2014-03-19 NOTE — Progress Notes (Signed)
   Subjective:    Patient ID: Janet [name], female    DOB: 03/30/1965, 49 y.o.   MRN: [id number]  HPI  PCP- Schoenoff  49 year old for followup of OSA  She reports loud snoring that has been noted by family members, her daughters close the door and she sleeps. She also reports excessive daytime somnolence and fatigue.  She has stopped her antidepressants including Wellbutrin. 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 She denies problems driving. HST showed mod OSA- AHI 17/h Attendant CPAP titration turned down- hence auto CPAP ordered 1 month download on auto 5-12- -AHI 1, average pressure 9, minimal leak, usage approximately 4 hours  She feels much better-no daytime somnolence, wonders if this is because of stopping antidepressants or due to CPAP. Resting much better, using about 4 hours every night  Review of Systems neg for any significant sore throat, dysphagia, itching, sneezing, nasal congestion or excess/ purulent secretions, fever, chills, sweats, unintended wt loss, pleuritic or exertional cp, hempoptysis, orthopnea pnd or change in chronic leg swelling. Also denies presyncope, palpitations, heartburn, abdominal pain, nausea, vomiting, diarrhea or change in bowel or urinary habits, dysuria,hematuria, rash, arthralgias, visual complaints, headache, numbness weakness or ataxia.     Objective:   Physical Exam  Gen. Pleasant, obese, in no distress ENT - no lesions, no post nasal drip Neck: No JVD, no thyromegaly, no carotid bruits Lungs: no use of accessory muscles, no dullness to percussion, decreased without rales or rhonchi  Cardiovascular: Rhythm regular, heart sounds  normal, no murmurs or gallops, no peripheral edema Musculoskeletal: No deformities, no cyanosis or clubbing , no tremors       Assessment & Plan:

## 2014-04-15 ENCOUNTER — Telehealth: Payer: Self-pay | Admitting: Pulmonary Disease

## 2014-04-15 NOTE — Telephone Encounter (Signed)
CPAP 9cm No residuals Usage needs to be more consistent

## 2014-04-16 NOTE — Telephone Encounter (Signed)
lmtcb

## 2014-04-16 NOTE — Telephone Encounter (Signed)
Spoke with pt.  Discussed below per RA.  She verbalized understanding. Pt states she was sleeping only 4 hours prior to pressure being changed to 9. Since pressure change, she is able to sleep 5 1/2 - 6 hours then wakes up. Once she wakes up, she takes machine off and goes back to sleep for approx 1-2 hours.   Pt would like RA to be aware she is now at 5 1/2 - 6 hours.  States goal was for 6 hours.

## 2014-04-17 ENCOUNTER — Encounter: Payer: Self-pay | Admitting: Pulmonary Disease

## 2014-05-07 ENCOUNTER — Telehealth: Payer: Self-pay | Admitting: Pulmonary Disease

## 2014-05-07 NOTE — Telephone Encounter (Signed)
+  9cm No residuals Missing nights

## 2014-05-08 NOTE — Telephone Encounter (Signed)
lmtcb

## 2014-05-11 NOTE — Telephone Encounter (Signed)
lmtcb

## 2014-05-12 ENCOUNTER — Telehealth: Payer: Self-pay | Admitting: Pulmonary Disease

## 2014-05-12 NOTE — Telephone Encounter (Signed)
Per 05/07/14 phone note:   Glean Hess, CMA at 05/07/2014 2:22 PM     Status: Signed       Expand All Collapse All   +9cm No residuals Missing nights      --  LMTCB x1

## 2014-05-13 NOTE — Telephone Encounter (Signed)
lmtcb for pt.  

## 2014-05-14 NOTE — Telephone Encounter (Signed)
lmtcb for pt.  

## 2014-05-14 NOTE — Telephone Encounter (Signed)
lmtcb

## 2014-05-18 NOTE — Telephone Encounter (Signed)
lmtcb x4 

## 2014-05-18 NOTE — Telephone Encounter (Signed)
Pt returning call. Please call back at work number [phone] ext [phone]

## 2014-05-18 NOTE — Telephone Encounter (Signed)
Spoke with pt and notified of results per Dr. Alva. Pt verbalized understanding and denied any questions. 

## 2014-05-20 ENCOUNTER — Encounter: Payer: Self-pay | Admitting: Pulmonary Disease

## 2014-07-15 ENCOUNTER — Encounter: Payer: Self-pay | Admitting: Podiatry

## 2014-07-15 ENCOUNTER — Ambulatory Visit (INDEPENDENT_AMBULATORY_CARE_PROVIDER_SITE_OTHER): Payer: Managed Care, Other (non HMO) | Admitting: Podiatry

## 2014-07-15 VITALS — BP 128/72 | HR 73

## 2014-07-15 DIAGNOSIS — M79673 Pain in unspecified foot: Secondary | ICD-10-CM | POA: Diagnosis not present

## 2014-07-15 DIAGNOSIS — L600 Ingrowing nail: Secondary | ICD-10-CM | POA: Insufficient documentation

## 2014-07-15 DIAGNOSIS — M79606 Pain in leg, unspecified: Secondary | ICD-10-CM | POA: Insufficient documentation

## 2014-07-15 NOTE — Progress Notes (Signed)
Subjective: 49 year old female presents complaining of painful ingrown nails on both great toes.  Objective: Neurovascular status are within normal. All pedal pulses are palpable. Painful ingrown nails on both great toes without drainage.   Assessment: Painful ingrown nails on both great toes.  Plan: Reviewed clinical findings and available options. Debrided ingrown nails.  Return as needed.

## 2014-07-15 NOTE — Patient Instructions (Signed)
Painful ingrown nails debrided. Return as needed.

## 2014-08-05 ENCOUNTER — Encounter: Payer: Self-pay | Admitting: Neurology

## 2014-08-05 ENCOUNTER — Ambulatory Visit (INDEPENDENT_AMBULATORY_CARE_PROVIDER_SITE_OTHER): Payer: Managed Care, Other (non HMO) | Admitting: Neurology

## 2014-08-05 VITALS — BP 126/81 | HR 74 | Ht 64.0 in | Wt 235.0 lb

## 2014-08-05 DIAGNOSIS — G43109 Migraine with aura, not intractable, without status migrainosus: Secondary | ICD-10-CM | POA: Diagnosis not present

## 2014-08-05 NOTE — Progress Notes (Signed)
PATIENT: Janet [name] DOB: 09-Oct-1965  Chief Complaint  Patient presents with  . Possible occular migraines    She has had 3-4 episodes in the last month where she has visual disturbances in her right eye and shooting pains throughout her head.  She describes the visual changes as seeing semi-circles and triangles in flashing rainbow colors then she is unable to see for approximately 5 minutes.  It typically resolves on its own and she does not develop a headache with these events.  She has had a normal eye evaluation.  She has recently completed a Prednisone dose pack.    HISTORICAL  Janet [name] 49 yo RH seen in consultation, referred by his primary care physician Dr. Yaakov Guthrie for evaluation of new onset headaches, episode of visual distortion  She had a history of hypertension, left Bell's palsy in 2008, with mild residual left upper and lower face weakness, synergistic left facial movements.  But she denies history of headaches, since March 2016, she experienced a few headaches, usually lateralized pressure headaches, with light sensitivity, lasting for couple hours, relieved by lying down, over-the-counter NSAIDs,  Since April 2016, she also experienced 3 or 4 episodes of visual distortion, involving her right visual field, flashing semicircle/ triangle patterns, sometimes with right visual field loss, lasting about 5 minutes, which is not associated with headache before or after, she denies loss of consciousness  She was evaluated by ophthalmologist, was reported normal, she denies visual change.  REVIEW OF SYSTEMS: Full 14 system review of systems performed and notable only for as above  ALLERGIES: No Known Allergies  HOME MEDICATIONS: Current Outpatient Prescriptions  Medication Sig Dispense Refill  . lisinopril-hydrochlorothiazide (PRINZIDE,ZESTORETIC) 10-12.5 MG per tablet TAKE 1 TABLET BY MOUTH DAILY 90 tablet 2  . methylPREDNISolone (MEDROL DOSEPAK) 4 MG  TBPK tablet   0  . metoprolol succinate (TOPROL-XL) 25 MG 24 hr tablet TAKE 1 TABLET BY MOUTH DAILY 90 tablet 2  . Probiotic Product (ALIGN) 4 MG CAPS Take 1 capsule by mouth daily.    Marland Kitchen VITAMIN D, CHOLECALCIFEROL, PO Take by mouth.     No current facility-administered medications for this visit.    PAST MEDICAL HISTORY: Past Medical History  Diagnosis Date  . Arthritis   . Depression   . Hypertension   . Hyperlipidemia   . Goiter, nontoxic, multinodular   . Obesity   . Hemorrhoids   . Vision changes   . Head pain     PAST SURGICAL HISTORY: Past Surgical History  Procedure Laterality Date  . Appendectomy  7/10  . Cesarean section    . Mcbride bunionectomy Right 04/03/2013    @ [place]  . Achilles tendon lengthening Right 04/03/2013    @ [place]  . Cotton osteotomy w/ bone graft Right 04/03/2013    @ [place]    FAMILY HISTORY: Family History  Problem Relation Age of Onset  . Diabetes Mother   . Hypertension Father   . Hypertension Brother   . Asthma Sister   . COPD Maternal Grandmother   . Diabetes Paternal Grandmother   . Heart attack Neg Hx   . Hyperlipidemia Neg Hx   . Sudden death Neg Hx   . Diabetes Maternal Grandfather   . Stroke Paternal Grandfather     SOCIAL HISTORY:  History   Social History  . Marital Status: Divorced    Spouse Name: N/A  . Number of Children: 2  . Years of Education: BS  Occupational History  . Accounting supervisor    Social History Main Topics  . Smoking status: Never Smoker   . Smokeless tobacco: Never Used  . Alcohol Use: No  . Drug Use: No  . Sexual Activity: Not Currently   Other Topics Concern  . Not on file   Social History Narrative   Lives at home with two daughters.   Right-handed.   Drinks soda but only occasionally.   PHYSICAL EXAM   Filed Vitals:   08/05/14 0736  BP: 126/81  Pulse: 74  Height: 5\' 4"  (1.626 m)  Weight: 235 lb (106.595 kg)    Not recorded      Body mass index is 40.32  kg/(m^2).  PHYSICAL EXAMNIATION:  Gen: NAD, conversant, well nourised, obese, well groomed                     Cardiovascular: Regular rate rhythm, no peripheral edema, warm, nontender. Eyes: Conjunctivae clear without exudates or hemorrhage Neck: Supple, no carotid bruise. Pulmonary: Clear to auscultation bilaterally   NEUROLOGICAL EXAM:  MENTAL STATUS: Speech:    Speech is normal; fluent and spontaneous with normal comprehension.  Cognition:    The patient is oriented to person, place, and time;     recent and remote memory intact;     language fluent;     normal attention, concentration,     fund of knowledge.  CRANIAL NERVES: CN II: Visual fields are full to confrontation. Fundoscopic exam is normal with sharp discs and no vascular changes. Pupils were equal round reactive to light CN III, IV, VI: extraocular movement are normal. No ptosis. CN V: Facial sensation is intact to pinprick in all 3 divisions bilaterally. Corneal responses are intact.  CN VII: She has mild left eye-closure cheek puff weakness, left facial synergistic movement. CN VIII: Hearing is normal to rubbing fingers CN IX, X: Palate elevates symmetrically. Phonation is normal. CN XI: Head turning and shoulder shrug are intact CN XII: Tongue is midline with normal movements and no atrophy.  MOTOR: There is no pronator drift of out-stretched arms. Muscle bulk and tone are normal. Muscle strength is normal.  REFLEXES: Reflexes are 2+ and symmetric at the biceps, triceps, knees, and ankles. Plantar responses are flexor.  SENSORY: Light touch, pinprick, position sense, and vibration sense are intact in fingers and toes.  COORDINATION: Rapid alternating movements and fine finger movements are intact. There is no dysmetria on finger-to-nose and heel-knee-shin. There are no abnormal or extraneous movements.   GAIT/STANCE: Posture is normal. Gait is steady with normal steps, base, arm swing, and turning. Heel  and toe walking are normal. Tandem gait is normal.  Romberg is absent.   DIAGNOSTIC DATA (LABS, IMAGING, TESTING) - I reviewed patient records, labs, notes, testing and imaging myself where available.  Lab Results  Component Value Date   WBC 9.1 10/01/2013   HGB 12.4 10/01/2013   HCT 37.4 10/01/2013   MCV 84.8 10/01/2013   PLT 422* 10/01/2013      Component Value Date/Time   NA 138 01/12/2014 1139   K 4.0 01/12/2014 1139   CL 101 01/12/2014 1139   CO2 27 01/12/2014 1139   GLUCOSE 89 01/12/2014 1139   BUN 10 01/12/2014 1139   CREATININE 0.81 01/12/2014 1139   CALCIUM 9.5 01/12/2014 1139   PROT 7.2 01/12/2014 1139   ALBUMIN 3.9 01/12/2014 1139   AST 18 01/12/2014 1139   ALT 11 01/12/2014 1139   ALKPHOS 111 01/12/2014  1139   BILITOT 0.5 01/12/2014 1139   GFRNONAA 85 01/12/2014 1139   GFRAA >89 01/12/2014 1139   Lab Results  Component Value Date   CHOL 205* 01/12/2014   HDL 55 01/12/2014   LDLCALC 127* 01/12/2014   TRIG 115 01/12/2014   CHOLHDL 3.7 01/12/2014   Lab Results  Component Value Date   HGBA1C 5.9* 01/12/2014   No results found for: VITAMINB12 Lab Results  Component Value Date   TSH 1.215 10/01/2013      ASSESSMENT AND PLAN  Janet [name] is a 49 y.o. female    1, new-onset ocular migraine, she does not want to receive any preventive medications, when necessary NSAIDs as abortive treatment 2, continue to observe her symptoms, if she has more recurrent episode, will return to clinic for medication treatment, or imaging study.   Marcial Pacas, M.D. Ph.D.  [facility] [address], [place], [place] [address] Ph: [phone] Fax: [phone]

## 2014-08-11 ENCOUNTER — Other Ambulatory Visit: Payer: Self-pay | Admitting: Family Medicine

## 2014-08-11 DIAGNOSIS — D72829 Elevated white blood cell count, unspecified: Secondary | ICD-10-CM

## 2014-08-11 DIAGNOSIS — R1032 Left lower quadrant pain: Secondary | ICD-10-CM

## 2014-08-11 DIAGNOSIS — R509 Fever, unspecified: Secondary | ICD-10-CM

## 2014-08-12 ENCOUNTER — Ambulatory Visit
Admission: RE | Admit: 2014-08-12 | Discharge: 2014-08-12 | Disposition: A | Discharge status: Home / Self Care | Payer: Managed Care, Other (non HMO) | Source: Ambulatory Visit | Attending: Family Medicine | Admitting: Family Medicine

## 2014-08-12 DIAGNOSIS — D72829 Elevated white blood cell count, unspecified: Secondary | ICD-10-CM

## 2014-08-12 DIAGNOSIS — R509 Fever, unspecified: Secondary | ICD-10-CM

## 2014-08-12 DIAGNOSIS — R1032 Left lower quadrant pain: Secondary | ICD-10-CM

## 2014-08-12 MED ORDER — IOPAMIDOL (ISOVUE-300) INJECTION 61%
125.0000 mL | Freq: Once | INTRAVENOUS | Status: AC | PRN
  Administered 2014-08-12: 125 mL via INTRAVENOUS

## 2014-11-24 ENCOUNTER — Other Ambulatory Visit (HOSPITAL_BASED_OUTPATIENT_CLINIC_OR_DEPARTMENT_OTHER): Payer: Self-pay | Admitting: Family Medicine

## 2014-11-24 DIAGNOSIS — Z1231 Encounter for screening mammogram for malignant neoplasm of breast: Secondary | ICD-10-CM

## 2014-12-24 ENCOUNTER — Ambulatory Visit (HOSPITAL_BASED_OUTPATIENT_CLINIC_OR_DEPARTMENT_OTHER)
Admission: RE | Admit: 2014-12-24 | Discharge: 2014-12-24 | Disposition: A | Discharge status: Home / Self Care | Payer: Managed Care, Other (non HMO) | Source: Ambulatory Visit | Attending: Family Medicine | Admitting: Family Medicine

## 2014-12-24 DIAGNOSIS — Z1231 Encounter for screening mammogram for malignant neoplasm of breast: Secondary | ICD-10-CM | POA: Diagnosis not present

## 2015-02-23 MED FILL — METOPROLOL SUCC ER 25 MG TA: 25 | 30 days supply | Qty: 30 | Fill #5

## 2015-02-23 MED FILL — LISINOPRIL-HCTZ 10-12.5 MG: 10-12.5 | 30 days supply | Qty: 30 | Fill #5

## 2015-03-25 MED FILL — METOPROLOL SUCC ER 25 MG TA: 25 | 30 days supply | Qty: 30 | Fill #6

## 2015-03-25 MED FILL — LISINOPRIL-HCTZ 10-12.5 MG: 10-12.5 | 30 days supply | Qty: 30 | Fill #6

## 2015-04-20 MED FILL — SERTRALINE HCL 100 MG TAB: 100 | 30 days supply | Qty: 30 | Fill #0

## 2015-04-20 MED FILL — METOPROLOL SUCC ER 25 MG TA: 25 | 90 days supply | Qty: 90 | Fill #0

## 2015-04-20 MED FILL — LISINOPRIL-HCTZ 10-12.5 MG: 10-12.5 | 90 days supply | Qty: 90 | Fill #0

## 2015-05-21 DIAGNOSIS — T7500XA Unspecified effects of lightning, initial encounter: Secondary | ICD-10-CM

## 2015-05-21 HISTORY — DX: Unspecified effects of lightning, initial encounter: T75.00XA

## 2015-06-08 MED FILL — AZITHROMYCIN 250 MG TABLET: 250 | 5 days supply | Qty: 6 | Fill #0

## 2015-06-08 MED FILL — PROMETHAZINE-CODEINE SYRUP: 6.25-10 | 7 days supply | Qty: 140 | Fill #0

## 2015-07-06 ENCOUNTER — Other Ambulatory Visit: Payer: Self-pay | Admitting: Obstetrics & Gynecology

## 2015-07-08 NOTE — Patient Instructions (Signed)
Your procedure is scheduled on:  Thursday, July 22, 2015  Enter through the [employer] [facility] at: 6:00 AM  Pick up the phone at the desk and dial [phone].  Call this number if you have problems the morning of surgery: [phone].  Remember:  Do NOT eat food or drink after:  Midnight Wednesday, Jul 21, 2015  Take these medicines the morning of surgery with a SIP OF WATER:  Lisinopril, Metoprolol  Do NOT wear jewelry (body piercing), metal hair clips/bobby pins, make-up, or nail polish. Do NOT wear lotions, powders, or perfumes.  You may wear deodorant. Do NOT shave for 48 hours prior to surgery. Do NOT bring valuables to the hospital. Contacts, dentures, or bridgework may not be worn into surgery.  Leave suitcase in car.  After surgery it may be brought to your room.  For patients admitted to the hospital, checkout time is 11:00 AM the day of discharge.

## 2015-07-09 ENCOUNTER — Encounter (HOSPITAL_COMMUNITY): Payer: Self-pay

## 2015-07-09 ENCOUNTER — Other Ambulatory Visit: Payer: Self-pay

## 2015-07-09 ENCOUNTER — Encounter (HOSPITAL_COMMUNITY)
Admission: RE | Admit: 2015-07-09 | Discharge: 2015-07-09 | Disposition: A | Discharge status: Home / Self Care | Payer: Managed Care, Other (non HMO) | Source: Ambulatory Visit | Attending: Obstetrics & Gynecology | Admitting: Obstetrics & Gynecology

## 2015-07-09 DIAGNOSIS — Z01812 Encounter for preprocedural laboratory examination: Secondary | ICD-10-CM | POA: Diagnosis present

## 2015-07-09 DIAGNOSIS — D259 Leiomyoma of uterus, unspecified: Secondary | ICD-10-CM | POA: Insufficient documentation

## 2015-07-09 DIAGNOSIS — K660 Peritoneal adhesions (postprocedural) (postinfection): Secondary | ICD-10-CM | POA: Diagnosis not present

## 2015-07-09 DIAGNOSIS — F329 Major depressive disorder, single episode, unspecified: Secondary | ICD-10-CM | POA: Insufficient documentation

## 2015-07-09 DIAGNOSIS — D251 Intramural leiomyoma of uterus: Secondary | ICD-10-CM | POA: Diagnosis not present

## 2015-07-09 DIAGNOSIS — N838 Other noninflammatory disorders of ovary, fallopian tube and broad ligament: Secondary | ICD-10-CM | POA: Diagnosis not present

## 2015-07-09 DIAGNOSIS — E785 Hyperlipidemia, unspecified: Secondary | ICD-10-CM | POA: Insufficient documentation

## 2015-07-09 DIAGNOSIS — D271 Benign neoplasm of left ovary: Secondary | ICD-10-CM | POA: Diagnosis not present

## 2015-07-09 DIAGNOSIS — Z6841 Body Mass Index (BMI) 40.0 and over, adult: Secondary | ICD-10-CM | POA: Diagnosis not present

## 2015-07-09 DIAGNOSIS — M1990 Unspecified osteoarthritis, unspecified site: Secondary | ICD-10-CM | POA: Diagnosis not present

## 2015-07-09 DIAGNOSIS — G4730 Sleep apnea, unspecified: Secondary | ICD-10-CM | POA: Insufficient documentation

## 2015-07-09 DIAGNOSIS — R102 Pelvic and perineal pain: Secondary | ICD-10-CM | POA: Diagnosis not present

## 2015-07-09 DIAGNOSIS — I10 Essential (primary) hypertension: Secondary | ICD-10-CM | POA: Insufficient documentation

## 2015-07-09 DIAGNOSIS — E669 Obesity, unspecified: Secondary | ICD-10-CM | POA: Insufficient documentation

## 2015-07-09 DIAGNOSIS — N921 Excessive and frequent menstruation with irregular cycle: Secondary | ICD-10-CM | POA: Diagnosis present

## 2015-07-09 DIAGNOSIS — G43909 Migraine, unspecified, not intractable, without status migrainosus: Secondary | ICD-10-CM | POA: Diagnosis not present

## 2015-07-09 DIAGNOSIS — N840 Polyp of corpus uteri: Secondary | ICD-10-CM | POA: Diagnosis not present

## 2015-07-09 DIAGNOSIS — N72 Inflammatory disease of cervix uteri: Secondary | ICD-10-CM | POA: Diagnosis not present

## 2015-07-09 DIAGNOSIS — Z01810 Encounter for preprocedural cardiovascular examination: Secondary | ICD-10-CM | POA: Diagnosis present

## 2015-07-09 HISTORY — DX: Bell's palsy: G51.0

## 2015-07-09 HISTORY — DX: Unspecified effects of lightning, initial encounter: T75.00XA

## 2015-07-09 HISTORY — DX: Benign lipomatous neoplasm, unspecified: D17.9

## 2015-07-09 HISTORY — DX: Cardiac arrhythmia, unspecified: I49.9

## 2015-07-09 HISTORY — DX: Sleep apnea, unspecified: G47.30

## 2015-07-09 LAB — CBC
HCT: 38.7 % (ref 36.0–46.0)
HEMOGLOBIN: 12.8 g/dL (ref 12.0–15.0)
MCH: 28.6 pg (ref 26.0–34.0)
MCHC: 33.1 g/dL (ref 30.0–36.0)
MCV: 86.4 fL (ref 78.0–100.0)
PLATELETS: 382 10*3/uL (ref 150–400)
RBC: 4.48 MIL/uL (ref 3.87–5.11)
RDW: 14.5 % (ref 11.5–15.5)
WBC: 7.3 10*3/uL (ref 4.0–10.5)

## 2015-07-09 LAB — COMPREHENSIVE METABOLIC PANEL
ALK PHOS: 103 U/L (ref 38–126)
ALT: 18 U/L (ref 14–54)
AST: 21 U/L (ref 15–41)
Albumin: 4.1 g/dL (ref 3.5–5.0)
Anion gap: 9 (ref 5–15)
BUN: 16 mg/dL (ref 6–20)
CALCIUM: 9.1 mg/dL (ref 8.9–10.3)
CHLORIDE: 101 mmol/L (ref 101–111)
CO2: 28 mmol/L (ref 22–32)
CREATININE: 0.94 mg/dL (ref 0.44–1.00)
GFR calc Af Amer: >60 mL/min (ref >60)
GFR calc non Af Amer: >60 mL/min (ref >60)
Glucose, Bld: 109 mg/dL — ABNORMAL HIGH (ref 65–99)
Potassium: 3.6 mmol/L (ref 3.5–5.1)
SODIUM: 138 mmol/L (ref 135–145)
Total Bilirubin: 0.5 mg/dL (ref 0.3–1.2)
Total Protein: 7.8 g/dL (ref 6.5–8.1)

## 2015-07-09 LAB — ABO/RH: ABO/RH(D): A POS

## 2015-07-09 LAB — TYPE AND SCREEN
ABO/RH(D): A POS
Antibody Screen: NEGATIVE

## 2015-07-16 MED FILL — LISINOPRIL-HCTZ 10-12.5 MG: 10-12.5 | 90 days supply | Qty: 90 | Fill #1

## 2015-07-16 MED FILL — METOPROLOL SUCC ER 25 MG TA: 25 | 90 days supply | Qty: 90 | Fill #1

## 2015-07-21 MED ORDER — DEXTROSE 5 % IV SOLN
INTRAVENOUS | Status: AC
  Administered 2015-07-22: 100 mL via INTRAVENOUS
  Filled 2015-07-21: qty 9.5

## 2015-07-21 NOTE — Anesthesia Preprocedure Evaluation (Signed)
Anesthesia Evaluation  Patient identified by MRN, date of birth, ID band Patient awake    Reviewed: Allergy & Precautions, NPO status , Patient's Chart, lab work & pertinent test results  Airway Mallampati: II  TM Distance: <3 FB Neck ROM: Full    Dental no notable dental hx.    Pulmonary neg pulmonary ROS, sleep apnea ,    Pulmonary exam normal breath sounds clear to auscultation       Cardiovascular hypertension, Pt. on medications and Pt. on home beta blockers Normal cardiovascular exam Rhythm:Regular Rate:Normal     Neuro/Psych negative neurological ROS  negative psych ROS   GI/Hepatic negative GI ROS, Neg liver ROS,   Endo/Other  Morbid obesity  Renal/GU negative Renal ROS  negative genitourinary   Musculoskeletal negative musculoskeletal ROS (+)   Abdominal   Peds negative pediatric ROS (+)  Hematology negative hematology ROS (+)   Anesthesia Other Findings   Reproductive/Obstetrics negative OB ROS                             Anesthesia Physical Anesthesia Plan  ASA: III  Anesthesia Plan: General   Post-op Pain Management:    Induction: Intravenous  Airway Management Planned: Oral ETT  Additional Equipment:   Intra-op Plan:   Post-operative Plan: Extubation in OR  Informed Consent: I have reviewed the patients History and Physical, chart, labs and discussed the procedure including the risks, benefits and alternatives for the proposed anesthesia with the patient or authorized representative who has indicated his/her understanding and acceptance.   Dental advisory given  Plan Discussed with: CRNA and Surgeon  Anesthesia Plan Comments:         Anesthesia Quick Evaluation

## 2015-07-22 ENCOUNTER — Ambulatory Visit (HOSPITAL_COMMUNITY): Payer: Managed Care, Other (non HMO) | Admitting: Anesthesiology

## 2015-07-22 ENCOUNTER — Ambulatory Visit (HOSPITAL_COMMUNITY)
Admission: RE | Admit: 2015-07-22 | Discharge: 2015-07-23 | Disposition: A | Discharge status: Home / Self Care | Payer: Managed Care, Other (non HMO) | Source: Ambulatory Visit | Attending: Obstetrics & Gynecology | Admitting: Obstetrics & Gynecology

## 2015-07-22 ENCOUNTER — Encounter (HOSPITAL_COMMUNITY)
Admission: RE | Disposition: A | Discharge status: Home / Self Care | Payer: Self-pay | Source: Ambulatory Visit | Attending: Obstetrics & Gynecology

## 2015-07-22 ENCOUNTER — Encounter (HOSPITAL_COMMUNITY): Payer: Self-pay | Admitting: Emergency Medicine

## 2015-07-22 DIAGNOSIS — Z9889 Other specified postprocedural states: Secondary | ICD-10-CM

## 2015-07-22 DIAGNOSIS — Z6841 Body Mass Index (BMI) 40.0 and over, adult: Secondary | ICD-10-CM | POA: Insufficient documentation

## 2015-07-22 DIAGNOSIS — N838 Other noninflammatory disorders of ovary, fallopian tube and broad ligament: Secondary | ICD-10-CM | POA: Insufficient documentation

## 2015-07-22 DIAGNOSIS — F329 Major depressive disorder, single episode, unspecified: Secondary | ICD-10-CM | POA: Insufficient documentation

## 2015-07-22 DIAGNOSIS — N840 Polyp of corpus uteri: Secondary | ICD-10-CM | POA: Insufficient documentation

## 2015-07-22 DIAGNOSIS — N921 Excessive and frequent menstruation with irregular cycle: Secondary | ICD-10-CM | POA: Diagnosis not present

## 2015-07-22 DIAGNOSIS — G43909 Migraine, unspecified, not intractable, without status migrainosus: Secondary | ICD-10-CM | POA: Insufficient documentation

## 2015-07-22 DIAGNOSIS — R102 Pelvic and perineal pain: Secondary | ICD-10-CM | POA: Insufficient documentation

## 2015-07-22 DIAGNOSIS — D251 Intramural leiomyoma of uterus: Secondary | ICD-10-CM | POA: Insufficient documentation

## 2015-07-22 DIAGNOSIS — D271 Benign neoplasm of left ovary: Secondary | ICD-10-CM | POA: Insufficient documentation

## 2015-07-22 DIAGNOSIS — E785 Hyperlipidemia, unspecified: Secondary | ICD-10-CM | POA: Insufficient documentation

## 2015-07-22 DIAGNOSIS — N72 Inflammatory disease of cervix uteri: Secondary | ICD-10-CM | POA: Insufficient documentation

## 2015-07-22 DIAGNOSIS — K660 Peritoneal adhesions (postprocedural) (postinfection): Secondary | ICD-10-CM | POA: Insufficient documentation

## 2015-07-22 DIAGNOSIS — M1990 Unspecified osteoarthritis, unspecified site: Secondary | ICD-10-CM | POA: Insufficient documentation

## 2015-07-22 DIAGNOSIS — I10 Essential (primary) hypertension: Secondary | ICD-10-CM | POA: Insufficient documentation

## 2015-07-22 DIAGNOSIS — G4730 Sleep apnea, unspecified: Secondary | ICD-10-CM | POA: Insufficient documentation

## 2015-07-22 HISTORY — PX: ROBOTIC ASSISTED TOTAL HYSTERECTOMY WITH SALPINGECTOMY: SHX6679

## 2015-07-22 HISTORY — DX: Other specified postprocedural states: R11.2

## 2015-07-22 HISTORY — DX: Other specified postprocedural states: Z98.890

## 2015-07-22 LAB — PREGNANCY, URINE: PREG TEST UR: NEGATIVE

## 2015-07-22 SURGERY — ROBOTIC ASSISTED TOTAL HYSTERECTOMY WITH SALPINGECTOMY
Anesthesia: General | Site: Abdomen | Laterality: Bilateral

## 2015-07-22 MED ORDER — HYDROMORPHONE HCL 1 MG/ML IJ SOLN
INTRAMUSCULAR | Status: AC
  Administered 2015-07-22: 0.5 mg via INTRAVENOUS
  Filled 2015-07-22: qty 1

## 2015-07-22 MED ORDER — MIDAZOLAM HCL 2 MG/2ML IJ SOLN
INTRAMUSCULAR | Status: DC | PRN
  Administered 2015-07-22: 2 mg via INTRAVENOUS

## 2015-07-22 MED ORDER — ACETAMINOPHEN 10 MG/ML IV SOLN
1000.0000 mg | Freq: Once | INTRAVENOUS | Status: AC
  Administered 2015-07-22: 1000 mg via INTRAVENOUS
  Filled 2015-07-22: qty 100

## 2015-07-22 MED ORDER — SODIUM CHLORIDE 0.9 % IJ SOLN
INTRAMUSCULAR | Status: AC
  Filled 2015-07-22: qty 50

## 2015-07-22 MED ORDER — LACTATED RINGERS IR SOLN
Status: DC | PRN
  Administered 2015-07-22: 3000 mL

## 2015-07-22 MED ORDER — LACTATED RINGERS IV SOLN
INTRAVENOUS | Status: DC
  Administered 2015-07-22: 21:00:00 via INTRAVENOUS

## 2015-07-22 MED ORDER — HYDROCHLOROTHIAZIDE 12.5 MG PO CAPS
12.5000 mg | ORAL_CAPSULE | Freq: Every day | ORAL | Status: DC
  Administered 2015-07-23: 12.5 mg via ORAL
  Filled 2015-07-22: qty 1

## 2015-07-22 MED ORDER — DEXAMETHASONE SODIUM PHOSPHATE 4 MG/ML IJ SOLN
INTRAMUSCULAR | Status: AC
  Filled 2015-07-22: qty 1

## 2015-07-22 MED ORDER — HYDROMORPHONE HCL 1 MG/ML IJ SOLN: 1.0000 mg | INTRAMUSCULAR | Status: DC | PRN

## 2015-07-22 MED ORDER — DEXAMETHASONE SODIUM PHOSPHATE 10 MG/ML IJ SOLN
INTRAMUSCULAR | Status: DC | PRN
  Administered 2015-07-22: 4 mg via INTRAVENOUS

## 2015-07-22 MED ORDER — VASOPRESSIN 20 UNIT/ML IV SOLN
INTRAVENOUS | Status: AC
  Filled 2015-07-22: qty 1

## 2015-07-22 MED ORDER — ROPIVACAINE HCL 5 MG/ML IJ SOLN
INTRAMUSCULAR | Status: AC
  Filled 2015-07-22: qty 30

## 2015-07-22 MED ORDER — SODIUM CHLORIDE 0.9 % IV SOLN
INTRAVENOUS | Status: DC | PRN
  Administered 2015-07-22: 60 mL

## 2015-07-22 MED ORDER — HYDROMORPHONE HCL 1 MG/ML IJ SOLN
INTRAMUSCULAR | Status: AC
  Filled 2015-07-22: qty 1

## 2015-07-22 MED ORDER — SUGAMMADEX SODIUM 200 MG/2ML IV SOLN
INTRAVENOUS | Status: AC
  Filled 2015-07-22: qty 2

## 2015-07-22 MED ORDER — FENTANYL CITRATE (PF) 100 MCG/2ML IJ SOLN
INTRAMUSCULAR | Status: DC | PRN
  Administered 2015-07-22 (×2): 50 ug via INTRAVENOUS
  Administered 2015-07-22: 100 ug via INTRAVENOUS

## 2015-07-22 MED ORDER — LACTATED RINGERS IV SOLN
INTRAVENOUS | Status: DC
  Administered 2015-07-22 (×2): via INTRAVENOUS

## 2015-07-22 MED ORDER — SCOPOLAMINE 1 MG/3DAYS TD PT72
1.0000 | MEDICATED_PATCH | Freq: Once | TRANSDERMAL | Status: DC
  Administered 2015-07-22: 1.5 mg via TRANSDERMAL

## 2015-07-22 MED ORDER — FENTANYL CITRATE (PF) 250 MCG/5ML IJ SOLN
INTRAMUSCULAR | Status: AC
  Filled 2015-07-22: qty 5

## 2015-07-22 MED ORDER — ONDANSETRON HCL 4 MG/2ML IJ SOLN
INTRAMUSCULAR | Status: DC | PRN
  Administered 2015-07-22: 4 mg via INTRAVENOUS

## 2015-07-22 MED ORDER — PROCHLORPERAZINE EDISYLATE 5 MG/ML IJ SOLN: 10.0000 mg | Freq: Once | INTRAMUSCULAR | Status: DC

## 2015-07-22 MED ORDER — LIDOCAINE HCL (CARDIAC) 20 MG/ML IV SOLN
INTRAVENOUS | Status: DC | PRN
  Administered 2015-07-22: 100 mg via INTRAVENOUS

## 2015-07-22 MED ORDER — PROPOFOL 10 MG/ML IV BOLUS
INTRAVENOUS | Status: DC | PRN
  Administered 2015-07-22: 200 mg via INTRAVENOUS

## 2015-07-22 MED ORDER — HYDROMORPHONE HCL 1 MG/ML IJ SOLN
0.2500 mg | INTRAMUSCULAR | Status: DC | PRN
  Administered 2015-07-22 (×3): 0.5 mg via INTRAVENOUS

## 2015-07-22 MED ORDER — PROPOFOL 10 MG/ML IV BOLUS
INTRAVENOUS | Status: AC
  Filled 2015-07-22: qty 20

## 2015-07-22 MED ORDER — IBUPROFEN 600 MG PO TABS
600.0000 mg | ORAL_TABLET | Freq: Four times a day (QID) | ORAL | Status: DC | PRN
  Administered 2015-07-23 (×2): 600 mg via ORAL
  Filled 2015-07-22 (×2): qty 1

## 2015-07-22 MED ORDER — LISINOPRIL-HYDROCHLOROTHIAZIDE 10-12.5 MG PO TABS: 1.0000 | ORAL_TABLET | Freq: Every day | ORAL | Status: DC

## 2015-07-22 MED ORDER — BUPIVACAINE HCL (PF) 0.25 % IJ SOLN
INTRAMUSCULAR | Status: AC
  Filled 2015-07-22: qty 30

## 2015-07-22 MED ORDER — ROCURONIUM BROMIDE 100 MG/10ML IV SOLN
INTRAVENOUS | Status: AC
  Filled 2015-07-22: qty 1

## 2015-07-22 MED ORDER — SCOPOLAMINE 1 MG/3DAYS TD PT72
MEDICATED_PATCH | TRANSDERMAL | Status: AC
  Administered 2015-07-22: 1.5 mg via TRANSDERMAL
  Filled 2015-07-22: qty 1

## 2015-07-22 MED ORDER — KETOROLAC TROMETHAMINE 30 MG/ML IJ SOLN: 30.0000 mg | Freq: Once | INTRAMUSCULAR | Status: DC | PRN

## 2015-07-22 MED ORDER — LISINOPRIL 10 MG PO TABS
10.0000 mg | ORAL_TABLET | Freq: Every day | ORAL | Status: DC
  Administered 2015-07-23: 10 mg via ORAL
  Filled 2015-07-22: qty 1

## 2015-07-22 MED ORDER — BUPIVACAINE HCL (PF) 0.25 % IJ SOLN
INTRAMUSCULAR | Status: DC | PRN
  Administered 2015-07-22: 2 mL
  Administered 2015-07-22: 8 mL
  Administered 2015-07-22: 7 mL

## 2015-07-22 MED ORDER — OXYCODONE-ACETAMINOPHEN 5-325 MG PO TABS: 1.0000 | ORAL_TABLET | ORAL | Status: DC | PRN

## 2015-07-22 MED ORDER — KETOROLAC TROMETHAMINE 30 MG/ML IJ SOLN
INTRAMUSCULAR | Status: AC
  Filled 2015-07-22: qty 1

## 2015-07-22 MED ORDER — LIDOCAINE HCL (CARDIAC) 20 MG/ML IV SOLN
INTRAVENOUS | Status: AC
  Filled 2015-07-22: qty 5

## 2015-07-22 MED ORDER — EPHEDRINE SULFATE 50 MG/ML IJ SOLN
INTRAMUSCULAR | Status: DC | PRN
  Administered 2015-07-22: 10 mg via INTRAVENOUS
  Administered 2015-07-22: 5 mg via INTRAVENOUS

## 2015-07-22 MED ORDER — ROCURONIUM BROMIDE 100 MG/10ML IV SOLN
INTRAVENOUS | Status: DC | PRN
  Administered 2015-07-22: 20 mg via INTRAVENOUS
  Administered 2015-07-22: 50 mg via INTRAVENOUS
  Administered 2015-07-22: 10 mg via INTRAVENOUS
  Administered 2015-07-22: 20 mg via INTRAVENOUS

## 2015-07-22 MED ORDER — METOPROLOL SUCCINATE ER 25 MG PO TB24
25.0000 mg | ORAL_TABLET | Freq: Every day | ORAL | Status: DC
  Administered 2015-07-23: 25 mg via ORAL
  Filled 2015-07-22: qty 1

## 2015-07-22 MED ORDER — ONDANSETRON HCL 4 MG/2ML IJ SOLN
INTRAMUSCULAR | Status: AC
  Filled 2015-07-22: qty 2

## 2015-07-22 MED ORDER — SUGAMMADEX SODIUM 200 MG/2ML IV SOLN
INTRAVENOUS | Status: DC | PRN
  Administered 2015-07-22: 214.4 mg via INTRAVENOUS

## 2015-07-22 MED ORDER — MIDAZOLAM HCL 2 MG/2ML IJ SOLN
INTRAMUSCULAR | Status: AC
  Filled 2015-07-22: qty 2

## 2015-07-22 MED ORDER — STERILE WATER FOR IRRIGATION IR SOLN
Status: DC | PRN
  Administered 2015-07-22: 1000 mL via INTRAVESICAL

## 2015-07-22 SURGICAL SUPPLY — 59 items
BARRIER ADHS 3X4 INTERCEED (GAUZE/BANDAGES/DRESSINGS) ×3 IMPLANT
BRR ADH 4X3 ABS CNTRL BYND (GAUZE/BANDAGES/DRESSINGS) ×1
CATH FOLEY 3WAY  5CC 16FR (CATHETERS) ×2
CATH FOLEY 3WAY 5CC 16FR (CATHETERS) ×1 IMPLANT
CLOTH BEACON ORANGE TIMEOUT ST (SAFETY) ×3 IMPLANT
CONT PATH 16OZ SNAP LID 3702 (MISCELLANEOUS) ×3 IMPLANT
COVER BACK TABLE 60X90IN (DRAPES) ×6 IMPLANT
COVER TIP SHEARS 8 DVNC (MISCELLANEOUS) ×1 IMPLANT
COVER TIP SHEARS 8MM DA VINCI (MISCELLANEOUS) ×2
DECANTER SPIKE VIAL GLASS SM (MISCELLANEOUS) ×1 IMPLANT
DRAPE ROBOTICS STRL (DRAPES) ×2 IMPLANT
DRSG OPSITE POSTOP 3X4 (GAUZE/BANDAGES/DRESSINGS) ×2 IMPLANT
DURAPREP 26ML APPLICATOR (WOUND CARE) ×3 IMPLANT
ELECT REM PT RETURN 9FT ADLT (ELECTROSURGICAL) ×3
ELECTRODE REM PT RTRN 9FT ADLT (ELECTROSURGICAL) ×1 IMPLANT
GAUZE SPONGE 4X4 16PLY XRAY LF (GAUZE/BANDAGES/DRESSINGS) ×2 IMPLANT
GAUZE VASELINE 3X9 (GAUZE/BANDAGES/DRESSINGS) IMPLANT
GLOVE BIO SURGEON STRL SZ 6.5 (GLOVE) ×2 IMPLANT
GLOVE BIO SURGEON STRL SZ7 (GLOVE) ×3 IMPLANT
GLOVE BIO SURGEONS STRL SZ 6.5 (GLOVE) ×1
GLOVE BIOGEL PI IND STRL 7.0 (GLOVE) ×4 IMPLANT
GLOVE BIOGEL PI INDICATOR 7.0 (GLOVE) ×8
HEMOSTAT SURGICEL 2X14 (HEMOSTASIS) ×2 IMPLANT
IV STOPCOCK 4 WAY 40  W/Y SET (IV SOLUTION)
IV STOPCOCK 4 WAY 40 W/Y SET (IV SOLUTION) IMPLANT
KIT ACCESSORY DA VINCI DISP (KITS) ×2
KIT ACCESSORY DVNC DISP (KITS) ×1 IMPLANT
LEGGING LITHOTOMY PAIR STRL (DRAPES) ×3 IMPLANT
LIQUID BAND (GAUZE/BANDAGES/DRESSINGS) ×4 IMPLANT
OCCLUDER COLPOPNEUMO (BALLOONS) ×2 IMPLANT
PACK ROBOT WH (CUSTOM PROCEDURE TRAY) ×3 IMPLANT
PACK ROBOTIC GOWN (GOWN DISPOSABLE) ×3 IMPLANT
PAD PREP 24X48 CUFFED NSTRL (MISCELLANEOUS) ×6 IMPLANT
PAD TRENDELENBURG POSITION (MISCELLANEOUS) ×3 IMPLANT
SET CYSTO W/LG BORE CLAMP LF (SET/KITS/TRAYS/PACK) ×2 IMPLANT
SET IRRIG TUBING LAPAROSCOPIC (IRRIGATION / IRRIGATOR) ×6 IMPLANT
SET TRI-LUMEN FLTR TB AIRSEAL (TUBING) ×2 IMPLANT
SPONGE GAUZE 2X2 8PLY STER LF (GAUZE/BANDAGES/DRESSINGS) ×1
SPONGE GAUZE 2X2 8PLY STRL LF (GAUZE/BANDAGES/DRESSINGS) ×1 IMPLANT
SUT ETHIBOND 0 (SUTURE) ×6 IMPLANT
SUT VIC AB 2-0 SH 27 (SUTURE) ×3
SUT VIC AB 2-0 SH 27XBRD (SUTURE) IMPLANT
SUT VIC AB 4-0 PS2 27 (SUTURE) ×8 IMPLANT
SUT VICRYL 0 UR6 27IN ABS (SUTURE) ×6 IMPLANT
SUT VLOC 180 0 9IN  GS21 (SUTURE) ×2
SUT VLOC 180 0 9IN GS21 (SUTURE) IMPLANT
SYR 50ML LL SCALE MARK (SYRINGE) ×3 IMPLANT
SYSTEM CONVERTIBLE TROCAR (TROCAR) ×2 IMPLANT
TIP RUMI ORANGE 6.7MMX12CM (TIP) IMPLANT
TIP UTERINE 5.1X6CM LAV DISP (MISCELLANEOUS) ×2 IMPLANT
TIP UTERINE 6.7X10CM GRN DISP (MISCELLANEOUS) IMPLANT
TIP UTERINE 6.7X6CM WHT DISP (MISCELLANEOUS) IMPLANT
TIP UTERINE 6.7X8CM BLUE DISP (MISCELLANEOUS) ×2 IMPLANT
TOWEL OR 17X24 6PK STRL BLUE (TOWEL DISPOSABLE) ×9 IMPLANT
TROCAR 12M 150ML BLUNT (TROCAR) ×2 IMPLANT
TROCAR DISP BLADELESS 8 DVNC (TROCAR) ×1 IMPLANT
TROCAR DISP BLADELESS 8MM (TROCAR) ×2
TROCAR PORT AIRSEAL 5X120 (TROCAR) ×2 IMPLANT
WATER STERILE IRR 1000ML POUR (IV SOLUTION) ×9 IMPLANT

## 2015-07-22 NOTE — Transfer of Care (Signed)
Immediate Anesthesia Transfer of Care Note  Patient: Janet [name]  Procedure(s) Performed: Procedure(s): ROBOTIC ASSISTED TOTAL HYSTERECTOMY WITH Right SALPINGECTOMY/Left Salpingo-Oophorectomy, Lysis of Omentum Adhesions, Removal Left ParaTubal Cyst (Bilateral)  Patient Location: PACU  Anesthesia Type:General  Level of Consciousness: awake, alert  and oriented  Airway & Oxygen Therapy: Patient Spontanous Breathing and Patient connected to nasal cannula oxygen  Post-op Assessment: Report given to RN and Post -op Vital signs reviewed and stable  Post vital signs: Reviewed and stable  Last Vitals:  Filed Vitals:   07/22/15 0616  BP: 124/69  Pulse: 72  Temp: 36.7 C  Resp: 16    Last Pain: There were no vitals filed for this visit.    Patients Stated Pain Goal: 3 ([id number])  Complications: No apparent anesthesia complications

## 2015-07-22 NOTE — Anesthesia Postprocedure Evaluation (Signed)
Anesthesia Post Note  Patient: Janet [name]  Procedure(s) Performed: Procedure(s) (LRB): ROBOTIC ASSISTED TOTAL HYSTERECTOMY WITH Right SALPINGECTOMY/Left Salpingo-Oophorectomy, Lysis of Omentum Adhesions, Removal Left ParaTubal Cyst (Bilateral)  Patient location during evaluation: PACU Anesthesia Type: General Level of consciousness: awake and alert Pain management: pain level controlled Vital Signs Assessment: post-procedure vital signs reviewed and stable Respiratory status: spontaneous breathing, nonlabored ventilation, respiratory function stable and patient connected to nasal cannula oxygen Cardiovascular status: blood pressure returned to baseline and stable Postop Assessment: no signs of nausea or vomiting Anesthetic complications: no    Last Vitals:  Filed Vitals:   07/22/15 1230 07/22/15 1245  BP: 114/70 115/72  Pulse: 98 91  Temp:    Resp: 18 15    Last Pain:  Filed Vitals:   07/22/15 1256  PainSc: 5                  Tymeka Privette S

## 2015-07-22 NOTE — Anesthesia Procedure Notes (Signed)
Procedure Name: Intubation Date/Time: 07/22/2015 7:32 AM Performed by: Hewitt Blade Pre-anesthesia Checklist: Patient identified, Emergency Drugs available, Suction available and Patient being monitored Patient Re-evaluated:Patient Re-evaluated prior to inductionOxygen Delivery Method: Circle system utilized Preoxygenation: Pre-oxygenation with 100% oxygen Intubation Type: IV induction Ventilation: Mask ventilation without difficulty and Oral airway inserted - appropriate to patient size Laryngoscope Size: Mac and 3 Grade View: Grade I Tube type: Oral Tube size: 7.0 mm Number of attempts: 1 Placement Confirmation: ETT inserted through vocal cords under direct vision,  positive ETCO2 and breath sounds checked- equal and bilateral Secured at: 21 cm Tube secured with: Tape Dental Injury: Teeth and Oropharynx as per pre-operative assessment

## 2015-07-22 NOTE — Op Note (Signed)
07/22/2015  12:10 PM  PATIENT:  Janet [name]  50 y.o. female  PRE-OPERATIVE DIAGNOSIS:  Uterine Fibroids, Menometrorrhagia, Pelvic Pain, Left Ovarian Mass  POST-OPERATIVE DIAGNOSIS:  Uterine fibroids, Menometorrhagia, Pelvic pain, Left Ovarian Mass, Omentum Adhesions   PROCEDURE:  Procedure(s): ROBOTIC ASSISTED TOTAL HYSTERECTOMY WITH Right SALPINGECTOMY/Left Salpingo-Oophorectomy, Lysis of Omentum Adhesions, Removal Left ParaTubal Cyst  SURGEON:  Surgeon(s): Princess Bruins, MD  ASSISTANTS: Hester Mates   ANESTHESIA:   general   PROCEDURE:  Under general anesthesia with endotracheal intubation the patient is an lithotomy position. She is prepped with ChloraPrep on the abdomen and with Betadine on the suprapubic vulvar and vaginal areas. She is draped as usual. Vaginally, the medium Koh ring with a #8 Rumi are put in place easily. A Foley catheter is inserted in the bladder. We go to the abdomen A supra-umbilical incision is made with the scalpel over 1.5 cm after infiltrating Marcaine one quarter plain.  The aponeurosis is grasped with Coker clamps and opened with Mayo scissors. The parietal peritoneum is opened bluntly with a finger. A pursestring stitch of Vicryl 0 was done on the aponeurosis. We then inserted the  [facility] under direct vision and pneumoperitoneum is created with CO2.  The abdominopelvic inspection reveals omental adhesions with the anterior wall of the abdomen in the midline.  The uterus presents myomas with the largest at the fundus. Both tubes are status post tubal ligation.  The right ovary is normal in appearance and size. The left ovary presents a mass of about 5 cm in diameter.  The patient had 2 previous C-sections and adhesions are present between the bladder and the uterus . We use a semicircular configuration for port placement.   Two robotic ports on the right and 1 robotic port on the lower left, with the 5 mm assistant port on the upper left.  Each site is  infiltrated with Marcaine one quarter plain and a small incision is done with the scalpel. Each port is inserted under direct vision.  We then placed the patient in deep Trendelenburg and the robot is docked from the right side.  Robotic instruments are inserted under direct vision with the Endo Shears scissors in the right arm, the PK in the left arm and the pro grasp in the third arm.  We then go to the console.      We first proceeded with lysis of the omental adhesions.  We also lysed the adhesions of the bowel with the left adnexa and abdominal wall.  Both ureters were seen in normal anatomic position with good peristalsis.  We auterized and section the rightround ligament. We then cauterized in sectioned  the right mesosalpinx.The distal part of the right tube was removed from the abdomen by my assistant. We then cauterized and sectioned the right utero-ovarian ligament. We opened the broad ligament and open the visceral peritoneum anteriorly.  We then went on the left side.  We cauterized in section the left round ligament.  We then cauterized and sectioned the left infundibulopelvic ligament.  We cauterize and sectioned below the left ovary and reached the the lateral side of the uterus.  We then open the broad ligament and sectioned the visceral peritoneum anteriorly.  He bladder was very adherent to the anterior uterus because of the previous 2 C-sections.  We proceeded cautiously and were able to lower the bladder passed the [facility]  Ring.  We then cauterized the right and left uterine arteries and sectioned them.  The upper  aspect of the vagina was opened on top of the Koe ring posteriorly, on the sides and anteriorly.  The uterus was completely detached from the vagina.  The left ovary was detached from the uterus to make sure it would be passed vaginally safely without rupture.  All specimens were removed vaginally and sent to pathology.  We completed hemostasis on the vaginal edges.  The instruments were  switched to the cutting needle driver in the right hand, the long tip in the second hand and the PK in the third arm.  We closed the vagina with 2 running sutures of V LOC 0 at 9 inches.  Both ureters were observed to have good peristalsis at the end of the surgery.  Hemostasis was completed with the PK.  Hemostasis was adequate at all levels. All robotic instruments were removed under direct vision. The robot was undocked. We went to laparoscopy time.  Pictures were taken before and after the intervention.      Due to V lock needles were removed by laparoscopy.  We then irrigated and suctioned the abdominopelvic cavities.  Hemostasis was confirmed once more.  A Surgicel was put on the vaginal vault.  We then infiltrated Bupivicaine 30 cc.  We removed all laparoscopic instruments.  The CO2 was evacuated.  All ports were removed.  We closed the supraumbilical incision by attaching the pursestring stitch on the aponeurosis.  All incisions were closed at the skin with a subcuticular suture of Vicryl 4-0.  Dermabond was added on all incisions.  The vaginal exam was done confirming a good closure of the vaginal vault.  A small vaginal laceration was noted.  It was repaired with a locked suture of Vicryl 2-0.  Hemostasis was adequate vaginally.  The patient was brought to recovery room in good and stable status.  ESTIMATED BLOOD LOSS:  100 cc   Intake/Output Summary (Last 24 hours) at 07/22/15 1210 Last data filed at 07/22/15 1139  Gross per 24 hour  Intake   1600 ml  Output    400 ml  Net   1200 ml     BLOOD ADMINISTERED:none   LOCAL MEDICATIONS USED:  MARCAINE    and BUPIVICAINE   SPECIMEN:  Source of Specimen:  Uterus with cervix, bilateral tubes and Left Ovary  DISPOSITION OF SPECIMEN:  PATHOLOGY  COUNTS:  YES  PLAN OF CARE: Transfer to PACU   Princess Bruins MD  07/22/2015 at 12:10 pm
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
## 2015-07-23 ENCOUNTER — Encounter (HOSPITAL_COMMUNITY): Payer: Self-pay | Admitting: Obstetrics & Gynecology

## 2015-07-23 DIAGNOSIS — N921 Excessive and frequent menstruation with irregular cycle: Secondary | ICD-10-CM | POA: Diagnosis not present

## 2015-07-23 LAB — COMPREHENSIVE METABOLIC PANEL
ALBUMIN: 3.3 g/dL — AB (ref 3.5–5.0)
ALK PHOS: 83 U/L (ref 38–126)
ALT: 18 U/L (ref 14–54)
ANION GAP: 6 (ref 5–15)
AST: 28 U/L (ref 15–41)
BUN: 8 mg/dL (ref 6–20)
CALCIUM: 7.8 mg/dL — AB (ref 8.9–10.3)
CO2: 27 mmol/L (ref 22–32)
Chloride: 100 mmol/L — ABNORMAL LOW (ref 101–111)
Creatinine, Ser: 0.85 mg/dL (ref 0.44–1.00)
GFR calc Af Amer: >60 mL/min (ref >60)
GFR calc non Af Amer: >60 mL/min (ref >60)
GLUCOSE: 111 mg/dL — AB (ref 65–99)
Potassium: 3.2 mmol/L — ABNORMAL LOW (ref 3.5–5.1)
SODIUM: 133 mmol/L — AB (ref 135–145)
Total Bilirubin: 0.8 mg/dL (ref 0.3–1.2)
Total Protein: 6.1 g/dL — ABNORMAL LOW (ref 6.5–8.1)

## 2015-07-23 LAB — CBC
HEMATOCRIT: 31 % — AB (ref 36.0–46.0)
HEMOGLOBIN: 10.2 g/dL — AB (ref 12.0–15.0)
MCH: 28.7 pg (ref 26.0–34.0)
MCHC: 32.9 g/dL (ref 30.0–36.0)
MCV: 87.3 fL (ref 78.0–100.0)
Platelets: 336 10*3/uL (ref 150–400)
RBC: 3.55 MIL/uL — AB (ref 3.87–5.11)
RDW: 14.5 % (ref 11.5–15.5)
WBC: 15.8 10*3/uL — AB (ref 4.0–10.5)

## 2015-07-23 MED ORDER — OXYCODONE-ACETAMINOPHEN 7.5-325 MG PO TABS: 1.0000 | ORAL_TABLET | ORAL | Status: DC | PRN

## 2015-07-23 NOTE — Addendum Note (Signed)
Addendum  created 07/23/15 0819 by Riki Sheer, CRNA   Modules edited: Clinical Notes   Clinical Notes:  File: [id number]

## 2015-07-23 NOTE — Anesthesia Postprocedure Evaluation (Signed)
Anesthesia Post Note  Patient: Janet [name]  Procedure(s) Performed: Procedure(s) (LRB): ROBOTIC ASSISTED TOTAL HYSTERECTOMY WITH Right SALPINGECTOMY/Left Salpingo-Oophorectomy, Lysis of Omentum Adhesions, Removal Left ParaTubal Cyst (Bilateral)  Patient location during evaluation: [facility] Anesthesia Type: General Level of consciousness: awake and alert Pain management: pain level controlled Vital Signs Assessment: post-procedure vital signs reviewed and stable Respiratory status: spontaneous breathing, nonlabored ventilation, respiratory function stable and patient connected to nasal cannula oxygen Cardiovascular status: blood pressure returned to baseline and stable Postop Assessment: no signs of nausea or vomiting Anesthetic complications: no     Last Vitals:  Filed Vitals:   07/23/15 0130 07/23/15 0604  BP: 127/62 101/53  Pulse: 77 83  Temp: 36.2 C 36.7 C  Resp: 19 18    Last Pain:  Filed Vitals:   07/23/15 0613  PainSc: Asleep   Pain Goal: Patients Stated Pain Goal: 3 (07/23/15 0505)               Riki Sheer

## 2015-07-23 NOTE — Discharge Summary (Signed)
  Physician Discharge Summary  Patient ID: Janet [name] MRN: [id number] DOB/AGE: 27-Jun-1965 50 y.o.  Admit date: 07/22/2015 Discharge date: 07/23/2015  Admission Diagnoses: Uterine Fibroids, Menometrorrhagia, Pelvic Pain, Left Ovarian Mass  Discharge Diagnoses: Uterine Fibroids, Menometrorrhagia, Pelvic Pain, Left Ovarian Mass, Omental Adhesions        Active Problems:   Postoperative state   Discharged Condition: good  Hospital Course: Good  Consults: None  Treatments: surgery: Robotic Total Laparoscopic Hysterectomy with Bilateral Salpingectomy and Left Oophorectomy, Lysis of Adhesions  Disposition: 01-Home or Self Care     Medication List    TAKE these medications        lisinopril-hydrochlorothiazide 10-12.5 MG tablet  Commonly known as:  PRINZIDE,ZESTORETIC  TAKE 1 TABLET BY MOUTH DAILY     metoprolol succinate 25 MG 24 hr tablet  Commonly known as:  TOPROL-XL  TAKE 1 TABLET BY MOUTH DAILY     oxyCODONE-acetaminophen 7.5-325 MG tablet  Commonly known as:  PERCOCET  Take 1 tablet by mouth every 4 (four) hours as needed for severe pain.      ASK your doctor about these medications        ALIGN 4 MG Caps  Take 1 capsule by mouth daily.           Follow-up Information    Follow up with Jilda Kress,[name], MD In 3 weeks.   Specialty:  Obstetrics and Gynecology   Contact information:   [address] [place] [address] [phone]       Signed: Princess Bruins, MD 07/23/2015, 7:14 AM

## 2015-07-23 NOTE — Discharge Instructions (Signed)
Total Laparoscopic Hysterectomy, Care After °Refer to this sheet in the next few weeks. These instructions provide you with information on caring for yourself after your procedure. Your health care provider may also give you more specific instructions. Your treatment has been planned according to current medical practices, but problems sometimes occur. Call your health care provider if you have any problems or questions after your procedure. °WHAT TO EXPECT AFTER THE PROCEDURE °· Pain and bruising at the incision sites. You will be given pain medicine to control it. °· Menopausal symptoms such as hot flashes, night sweats, and insomnia if your ovaries were removed. °· Sore throat from the breathing tube that was inserted during surgery. °HOME CARE INSTRUCTIONS °· Only take over-the-counter or prescription medicines for pain, discomfort, or fever as directed by your health care provider.   °· Do not take aspirin. It can cause bleeding.   °· Do not drive when taking pain medicine.   °· Follow your health care provider's advice regarding diet, exercise, lifting, driving, and general activities.   °· Resume your usual diet as directed and allowed.   °· Get plenty of rest and sleep.   °· Do not douche, use tampons, or have sexual intercourse for at least 6 weeks, or until your health care provider gives you permission.   °· Change your bandages (dressings) as directed by your health care provider.   °· Monitor your temperature and notify your health care provider of a fever.   °· Take showers instead of baths for 2-3 weeks.   °· Do not drink alcohol until your health care provider gives you permission.   °· If you develop constipation, you may take a mild laxative with your health care provider's permission. Bran foods may help with constipation problems. Drinking enough fluids to keep your urine clear or pale yellow may help as well.   °· Try to have someone home with you for 1-2 weeks to help around the house.    °· Keep all of your follow-up appointments as directed by your health care provider.   °SEEK MEDICAL CARE IF: °· You have swelling, redness, or increasing pain around your incision sites.   °· You have pus coming from your incision.   °· You notice a bad smell coming from your incision.   °· Your incision breaks open.   °· You feel dizzy or lightheaded.   °· You have pain or bleeding when you urinate.   °· You have persistent diarrhea.   °· You have persistent nausea and vomiting.   °· You have abnormal vaginal discharge.   °· You have a rash.   °· You have any type of abnormal reaction or develop an allergy to your medicine.   °· You have poor pain control with your prescribed medicine.   °SEEK IMMEDIATE MEDICAL CARE IF: °· You have chest pain or shortness of breath. °· You have severe abdominal pain that is not relieved with pain medicine. °· You have pain or swelling in your legs. °MAKE SURE YOU: °· Understand these instructions. °· Will watch your condition. °· Will get help right away if you are not doing well or get worse. °  °This information is not intended to replace advice given to you by your health care provider. Make sure you discuss any questions you have with your health care provider. °  °Document Released: 11/27/2012 Document Revised: 02/11/2013 Document Reviewed: 11/27/2012 °Elsevier Interactive Patient Education ©2016 [employer] ° °

## 2015-07-23 NOTE — Progress Notes (Signed)
1 Day Post-Op Procedure(s) (LRB): ROBOTIC ASSISTED TOTAL HYSTERECTOMY WITH Right SALPINGECTOMY/Left Salpingo-Oophorectomy, Lysis of Omentum Adhesions, Removal Left ParaTubal Cyst (Bilateral)  Subjective: Patient reports that pain is well managed.  Tolerating normal diet as tolerated  diet without difficulty. No nausea / vomiting.  Ambulating and voiding.  Objective: BP 101/53 mmHg  Pulse 83  Temp(Src) 98.1 F (36.7 C) (Oral)  Resp 18  Ht 5' 4"  (1.626 m)  Wt 236 lb (107.049 kg)  BMI 40.49 kg/m2  SpO2 93%  LMP 04/21/2015 Lungs: clear Heart: normal rate and Results for Janet, [name] (MRN [id number]) as of 07/23/2015 07:09  Ref. Range 07/23/2015 05:25  Sodium Latest Ref Range: 135-145 mmol/L 133 (L)  Potassium Latest Ref Range: 3.5-5.1 mmol/L 3.2 (L)  Chloride Latest Ref Range: 101-111 mmol/L 100 (L)  CO2 Latest Ref Range: 22-32 mmol/L 27  BUN Latest Ref Range: 6-20 mg/dL 8  Creatinine Latest Ref Range: 0.44-1.00 mg/dL 0.85  Calcium Latest Ref Range: 8.9-10.3 mg/dL 7.8 (L)  EGFR (Non-African Amer.) Latest Ref Range: >60 mL/min >60  EGFR (African American) Latest Ref Range: >60 mL/min >60  Glucose Latest Ref Range: 65-99 mg/dL 111 (H)  Anion gap Latest Ref Range: 5-15  6  Alkaline Phosphatase Latest Ref Range: 38-126 U/L 83  Albumin Latest Ref Range: 3.5-5.0 g/dL 3.3 (L)  AST Latest Ref Range: 15-41 U/L 28  ALT Latest Ref Range: 14-54 U/L 18  Total Protein Latest Ref Range: 6.5-8.1 g/dL 6.1 (L)  Total Bilirubin Latest Ref Range: 0.3-1.2 mg/dL 0.8  WBC Latest Ref Range: 4.0-10.5 K/uL 15.8 (H)  RBC Latest Ref Range: 3.87-5.11 MIL/uL 3.55 (L)  Hemoglobin Latest Ref Range: 12.0-15.0 g/dL 10.2 (L)  HCT Latest Ref Range: 36.0-46.0 % 31.0 (L)  MCV Latest Ref Range: 78.0-100.0 fL 87.3  MCH Latest Ref Range: 26.0-34.0 pg 28.7  MCHC Latest Ref Range: 30.0-36.0 g/dL 32.9  RDW Latest Ref Range: 11.5-15.5 % 14.5  Platelets Latest Ref Range: 150-400 K/uL 336  rhythm Abdomen:soft and  appropriately tender.  BS present. Extremities: Homans sign is negative, no sign of DVT Incision: healing well    Assessment: s/p Procedure(s): ROBOTIC ASSISTED TOTAL HYSTERECTOMY WITH Right SALPINGECTOMY/Left Salpingo-Oophorectomy, Lysis of Omentum Adhesions, Removal Left ParaTubal Cyst: progressing well  Plan: Discharge home     [place] 07/23/2015, 7:09 AM

## 2015-09-09 ENCOUNTER — Ambulatory Visit (INDEPENDENT_AMBULATORY_CARE_PROVIDER_SITE_OTHER): Payer: Managed Care, Other (non HMO) | Admitting: Podiatry

## 2015-09-09 ENCOUNTER — Encounter: Payer: Self-pay | Admitting: Podiatry

## 2015-09-09 DIAGNOSIS — M722 Plantar fascial fibromatosis: Secondary | ICD-10-CM | POA: Diagnosis not present

## 2015-09-09 DIAGNOSIS — M216X9 Other acquired deformities of unspecified foot: Secondary | ICD-10-CM | POA: Diagnosis not present

## 2015-09-09 DIAGNOSIS — M216X1 Other acquired deformities of right foot: Secondary | ICD-10-CM

## 2015-09-09 DIAGNOSIS — M21969 Unspecified acquired deformity of unspecified lower leg: Secondary | ICD-10-CM | POA: Diagnosis not present

## 2015-09-09 DIAGNOSIS — M216X2 Other acquired deformities of left foot: Secondary | ICD-10-CM

## 2015-09-09 NOTE — Patient Instructions (Signed)
Both feet casted for orthotics. 

## 2015-09-09 NOTE — Progress Notes (Signed)
Subjective: 50 year old female presents complaining of pain in right foot. Patient points medial ankle deltoid ligament area and plantar fascial band along the course of the first ray being the source of foot pain.  OBJECTIVE:  DERMATOLOGIC EXAMINATION:  Skin and Nails are normal appearance.  VASCULAR EXAMINATION OF LOWER LIMBS:  Pedal pulses: All pedal pulses are palpable with normal pulsation.  NEUROLOGIC EXAMINATION OF THE LOWER LIMBS:  Achilles DTR is present and within normal.  Epicritic and tactile sensations grossly normal.  MUSCULOSKELETAL EXAMINATION:  Tight Achilles tendon, unable to dorsiflex beyond 90 degree with knee flexion and extension bilateral.  Positive STJ excess pronation bilateral. Positive of hypermobility of the first ray bilateral. S/P Cotton osteotomy with bone graft (2015) with normal instep height on right with flat instep on left.  ASSESSMENT:  1.  Weak and elevated first Tarsometatarsal joint right with restored plantar flexion following Cotton osteotomy. 2. Ankle equinus bilateral. 3. Excess Subtalar joint pronation with periarticular subluxation right. 4. Tenosynovitis Deltoid ligament right. 5. Plantar fasciitis right at medial column.  Plan: Reviewed clinical findings and available treatment options. Instructed to use ankle brace, OtC/Custom orthotics. Use NSAIA as needed. Both feet casted for new orthotics.

## 2015-11-03 MED FILL — LISINOPRIL-HCTZ 10-12.5 MG: 10-12.5 | 90 days supply | Qty: 90 | Fill #2

## 2015-11-03 MED FILL — METOPROLOL SUCC ER 25 MG TA: 25 | 90 days supply | Qty: 90 | Fill #2

## 2015-11-09 ENCOUNTER — Telehealth: Payer: Self-pay | Admitting: Cardiovascular Disease

## 2015-11-09 ENCOUNTER — Other Ambulatory Visit (HOSPITAL_BASED_OUTPATIENT_CLINIC_OR_DEPARTMENT_OTHER): Payer: Self-pay | Admitting: Obstetrics & Gynecology

## 2015-11-09 ENCOUNTER — Other Ambulatory Visit: Payer: Self-pay | Admitting: Oncology

## 2015-11-09 DIAGNOSIS — Z1231 Encounter for screening mammogram for malignant neoplasm of breast: Secondary | ICD-10-CM

## 2015-11-09 NOTE — Telephone Encounter (Signed)
Received records from [facility] for appointment on 11/26/15 with Dr Oval Linsey.  Records given to [facility] (medical records) for Dr Blenda Mounts schedule on 11/26/15. lp

## 2015-11-26 ENCOUNTER — Ambulatory Visit (INDEPENDENT_AMBULATORY_CARE_PROVIDER_SITE_OTHER): Payer: Managed Care, Other (non HMO) | Admitting: Cardiovascular Disease

## 2015-11-26 ENCOUNTER — Encounter: Payer: Self-pay | Admitting: Cardiovascular Disease

## 2015-11-26 VITALS — BP 116/78 | HR 67 | Ht 64.0 in | Wt 240.2 lb

## 2015-11-26 DIAGNOSIS — R002 Palpitations: Secondary | ICD-10-CM | POA: Diagnosis not present
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
## 2015-11-26 NOTE — Patient Instructions (Signed)
Medication Instructions:  Your physician recommends that you continue on your current medications as directed. Please refer to the Current Medication list given to you today.  Labwork: No new orders.   Testing/Procedures: No new orders.   Follow-Up: Your physician wants you to follow-up in: 1 YEAR with Dr Oval Linsey.  You will receive a reminder letter in the mail two months in advance. If you don't receive a letter, please call our office to schedule the follow-up appointment.   Any Other Special Instructions Will Be Listed Below (If Applicable).     If you need a refill on your cardiac medications before your next appointment, please call your pharmacy.

## 2015-12-27 ENCOUNTER — Ambulatory Visit (HOSPITAL_BASED_OUTPATIENT_CLINIC_OR_DEPARTMENT_OTHER)
Admission: RE | Admit: 2015-12-27 | Discharge: 2015-12-27 | Disposition: A | Discharge status: Home / Self Care | Payer: Managed Care, Other (non HMO) | Source: Ambulatory Visit | Attending: Obstetrics & Gynecology | Admitting: Obstetrics & Gynecology

## 2015-12-27 DIAGNOSIS — Z1231 Encounter for screening mammogram for malignant neoplasm of breast: Secondary | ICD-10-CM | POA: Insufficient documentation

## 2016-01-20 ENCOUNTER — Ambulatory Visit (INDEPENDENT_AMBULATORY_CARE_PROVIDER_SITE_OTHER): Payer: Managed Care, Other (non HMO) | Admitting: Podiatry

## 2016-01-20 ENCOUNTER — Encounter: Payer: Self-pay | Admitting: Podiatry

## 2016-01-20 VITALS — BP 141/85 | HR 63

## 2016-01-20 DIAGNOSIS — M21969 Unspecified acquired deformity of unspecified lower leg: Secondary | ICD-10-CM | POA: Diagnosis not present

## 2016-01-20 DIAGNOSIS — M7741 Metatarsalgia, right foot: Secondary | ICD-10-CM | POA: Diagnosis not present

## 2016-01-20 DIAGNOSIS — M216X1 Other acquired deformities of right foot: Secondary | ICD-10-CM

## 2016-01-20 DIAGNOSIS — M216X2 Other acquired deformities of left foot: Secondary | ICD-10-CM | POA: Diagnosis not present

## 2016-01-20 NOTE — Patient Instructions (Signed)
Seen for pain in right foot.  Noted of weak first metatarsal bone. May benefit from Metatarsal binder. Use Ankle brace as needed.  Ankle brace dispensed.

## 2016-01-20 NOTE — Progress Notes (Signed)
Subjective: 50 year old female presents complaining of pain in right foot under the first metatarsal head area. Today top of right foot is hurting. 3rd digit right foot is curling and forming a bump.  Patient is wearing custom orthotics.  Hx. Of right Cotton osteotomy with bone graft and bunionectomy February 2015.  OBJECTIVE:  DERMATOLOGIC EXAMINATION:  Skin and Nails are normal appearance.  VASCULAR EXAMINATION OF LOWER LIMBS:  Pedal pulses: All pedal pulses are palpable with normal pulsation.  NEUROLOGIC EXAMINATION OF THE LOWER LIMBS:  Achilles DTR is present and within normal.  Epicritic and tactile sensations grossly normal.  MUSCULOSKELETAL EXAMINATION:  Tight Achilles tendon, unable to dorsiflex beyond 90 degree with knee flexion and extension bilateral.  Positive for excess sagittal plane motion of the first ray right.  Positive STJ excess pronation bilateral. Positive of hypermobility of the first ray bilateral. S/P Cotton osteotomy with bone graft (2015) with normal instep height on right with flat instep on left.  ASSESSMENT:  1.  Weak and elevated first Tarsometatarsal joint right with restored plantar flexion following Cotton osteotomy. 2. Ankle equinus bilateral. 3. Excess Subtalar joint pronation with periarticular subluxation right. 4. Tenosynovitis Deltoid ligament right. 5. Metatarsalgia 1st MPJ area right.  Plan: Reviewed clinical findings and available treatment options. Instructed to use ankle brace, Metatarsal binder, and Custom orthotics. Use NSAIA as needed.

## 2016-01-31 MED FILL — METOPROLOL SUCC ER 25 MG TA: 25 | 90 days supply | Qty: 90 | Fill #3

## 2016-01-31 MED FILL — LISINOPRIL-HCTZ 10-12.5 MG: 10-12.5 | 90 days supply | Qty: 90 | Fill #3

## 2016-03-21 ENCOUNTER — Ambulatory Visit: Payer: Managed Care, Other (non HMO) | Admitting: Podiatry

## 2016-04-28 MED FILL — LISINOPRIL-HCTZ 10-12.5 MG: 10-12.5 | 90 days supply | Qty: 90 | Fill #0

## 2016-04-28 MED FILL — METOPROLOL SUCC ER 25 MG TA: 25 | 90 days supply | Qty: 90 | Fill #0

## 2016-07-28 ENCOUNTER — Encounter: Payer: Self-pay | Admitting: Obstetrics & Gynecology

## 2016-07-28 ENCOUNTER — Ambulatory Visit (INDEPENDENT_AMBULATORY_CARE_PROVIDER_SITE_OTHER): Payer: Managed Care, Other (non HMO) | Admitting: Obstetrics & Gynecology

## 2016-07-28 VITALS — BP 126/72 | Ht 64.0 in | Wt 247.0 lb

## 2016-07-28 DIAGNOSIS — F331 Major depressive disorder, recurrent, moderate: Secondary | ICD-10-CM | POA: Diagnosis not present

## 2016-07-28 DIAGNOSIS — F3281 Premenstrual dysphoric disorder: Secondary | ICD-10-CM | POA: Diagnosis not present

## 2016-07-28 DIAGNOSIS — N644 Mastodynia: Secondary | ICD-10-CM

## 2016-07-28 MED ORDER — NORETHINDRONE 0.35 MG PO TABS: 1.0000 | ORAL_TABLET | Freq: Every day | ORAL | 4 refills | Status: DC

## 2016-07-28 MED ORDER — FLUOXETINE HCL 20 MG PO CAPS: 20.0000 mg | ORAL_CAPSULE | Freq: Every day | ORAL | 11 refills | Status: DC

## 2016-07-28 MED FILL — FLUoxetine HCL 20 MG CAPS: 20 | 30 days supply | Qty: 30 | Fill #0

## 2016-07-28 MED FILL — NORETHINDRONE 0.35 MG TAB: 0.35 | 84 days supply | Qty: 84 | Fill #0

## 2016-07-28 NOTE — Progress Notes (Signed)
    Janet [name] 01-15-1966 [id number]        51 y.o.  G62A1 Divorced.  2 daughters doing very well.  Oldest going to [employer] for Med School.  RP:  Bilateral Breast pain x a few months.  HPI:  S/P Robotic TLH/Bilateral Salpingect/Lt Oophorectomy 07/22/2015.  Patho: Fibroids/Dermoid.  For the last few months, her cyclic breast pains have become continuous bilaterally.  Mostly inferiorly and laterally.  Has voluminous breasts, wearing good support braw, no change there.  No lump felt.  No nipple d/c.  No redness.  No fever.  Also c/o depressive Sxs which become severe x 1 week each month.  During that week, patient is suicidal.  2 weeks ago when it happened, she called everyone she knows for help.  She says that her 2 successful daughters are what keeps her going.  Past medical history,surgical history, problem list, medications, allergies, family history and social history were all reviewed and documented in the EPIC chart.  Directed ROS with pertinent positives and negatives documented in the history of present illness/assessment and plan.  Exam:  Vitals:   07/28/16 1600  BP: 126/72  Weight: 247 lb (112 kg)  Height: 5\' 4"  (1.626 m)   General appearance:  Normal  Breast exam:  Voluminous breasts.  No nodule or mass felt.  No erythema.  No nipple d/c.  Tenderness mainly in lower and lateral breasts both sides.  No axillary node felt.  Assessment/Plan:  51 y.o. G3P0010   1. Breast pain in female Voluminous breasts.  No mass/nodule felt.  No sign of infection.  Will schedule Dx bilat mammo/US.  Start on Camila to block hormonal cycle.  2. PMDD (premenstrual dysphoric disorder) Underlying major depression with worsening during PMDD week with suicidal ideations.  Start Prozac 20 mg PO qd.  Patient has taken it successfully in the past.  Camila as well to block ovulatory cycle.  3. Moderate episode of recurrent major depressive disorder ([place]) Refer to Psychiatrist urgently.  Started on  Prozac in meantime.  Counseling on above issues >50% x 25 minutes.  Princess Bruins MD, 4:05 PM 07/28/2016

## 2016-07-28 NOTE — Patient Instructions (Signed)
1. Breast pain in female Voluminous breasts.  No mass/nodule felt.  No sign of infection.  Will schedule Dx bilat mammo/US.  Start on Camila to block hormonal cycle.  2. PMDD (premenstrual dysphoric disorder) Underlying major depression with worsening during PMDD week with suicidal ideations.  Start Prozac 20 mg PO qd.  Patient has taken it successfully in the past.  Camila as well to block ovulatory cycle.  3. Moderate episode of recurrent major depressive disorder ([place]) Refer to Psychiatrist urgently.  Started on Prozac in meantime.   Debrina, it was a pleasure to see you today.  I would like to see you back in 3 months to f/u on the PMDD.

## 2016-08-01 ENCOUNTER — Telehealth: Payer: Self-pay | Admitting: *Deleted

## 2016-08-01 DIAGNOSIS — N644 Mastodynia: Secondary | ICD-10-CM

## 2016-08-01 NOTE — Telephone Encounter (Addendum)
-----   Message from Princess Bruins, MD sent at 07/28/2016  4:31 PM EDT ----- Regarding: Bilateral Dx mammo/US 1. Bilateral breast pain x many months.  Voluminous breasts, no nodule/mass felt.  2. Major depression with severe PMDD. During PMDD week

## 2016-08-01 NOTE — Telephone Encounter (Signed)
1. Appointment on 08/07/16 @ 11:00am at breast center  2. Appointment on 08/09/16 @ 10:00 am with Laroy Apple at [facility] location [address], [place]   Pt informed with both appointment time and date.

## 2016-08-07 ENCOUNTER — Other Ambulatory Visit: Payer: Managed Care, Other (non HMO)

## 2016-08-07 MED FILL — METOPROLOL SUCC ER 25 MG TA: 25 | 90 days supply | Qty: 90 | Fill #0

## 2016-08-07 MED FILL — LISINOPRIL-HCTZ 10-12.5 MG: 10-12.5 | 90 days supply | Qty: 90 | Fill #0

## 2016-08-09 ENCOUNTER — Ambulatory Visit: Payer: Managed Care, Other (non HMO) | Admitting: Clinical

## 2016-08-10 ENCOUNTER — Ambulatory Visit
Admission: RE | Admit: 2016-08-10 | Discharge: 2016-08-10 | Disposition: A | Discharge status: Home / Self Care | Payer: Managed Care, Other (non HMO) | Source: Ambulatory Visit | Attending: Obstetrics & Gynecology | Admitting: Obstetrics & Gynecology

## 2016-08-10 ENCOUNTER — Ambulatory Visit (INDEPENDENT_AMBULATORY_CARE_PROVIDER_SITE_OTHER): Payer: Managed Care, Other (non HMO) | Admitting: Clinical

## 2016-08-10 DIAGNOSIS — F331 Major depressive disorder, recurrent, moderate: Secondary | ICD-10-CM | POA: Diagnosis not present

## 2016-08-10 DIAGNOSIS — N644 Mastodynia: Secondary | ICD-10-CM

## 2016-08-24 ENCOUNTER — Ambulatory Visit: Payer: Managed Care, Other (non HMO) | Admitting: Clinical

## 2016-08-24 ENCOUNTER — Ambulatory Visit (INDEPENDENT_AMBULATORY_CARE_PROVIDER_SITE_OTHER): Payer: Managed Care, Other (non HMO) | Admitting: Psychology

## 2016-08-24 DIAGNOSIS — F331 Major depressive disorder, recurrent, moderate: Secondary | ICD-10-CM

## 2016-09-01 ENCOUNTER — Ambulatory Visit (INDEPENDENT_AMBULATORY_CARE_PROVIDER_SITE_OTHER): Payer: Managed Care, Other (non HMO) | Admitting: Psychology

## 2016-09-01 DIAGNOSIS — F331 Major depressive disorder, recurrent, moderate: Secondary | ICD-10-CM | POA: Diagnosis not present

## 2016-09-14 ENCOUNTER — Ambulatory Visit (INDEPENDENT_AMBULATORY_CARE_PROVIDER_SITE_OTHER): Payer: Managed Care, Other (non HMO) | Admitting: Psychology

## 2016-09-14 ENCOUNTER — Telehealth: Payer: Self-pay

## 2016-09-14 DIAGNOSIS — F331 Major depressive disorder, recurrent, moderate: Secondary | ICD-10-CM | POA: Diagnosis not present

## 2016-09-14 NOTE — Telephone Encounter (Signed)
Patient called because she picked up her daughter Ariana's bc pill Rx that we sent in for her. She said that they looked different and just like what she takes and she was concerned we had called in her Rx for her daughter by mistake. Just wanted to confirm.  I called her back and per DPR access note on file I explained that she and her daughter are on [employer] and likely they both now have the same generic one that looks alike.

## 2016-10-17 ENCOUNTER — Ambulatory Visit (INDEPENDENT_AMBULATORY_CARE_PROVIDER_SITE_OTHER): Payer: Managed Care, Other (non HMO) | Admitting: Psychology

## 2016-10-17 DIAGNOSIS — F331 Major depressive disorder, recurrent, moderate: Secondary | ICD-10-CM | POA: Diagnosis not present

## 2016-10-18 MED FILL — NORETHINDRONE 0.35 MG TAB: 0.35 | 84 days supply | Qty: 84 | Fill #1

## 2016-10-31 ENCOUNTER — Ambulatory Visit (INDEPENDENT_AMBULATORY_CARE_PROVIDER_SITE_OTHER): Payer: Managed Care, Other (non HMO) | Admitting: Psychology

## 2016-10-31 DIAGNOSIS — F331 Major depressive disorder, recurrent, moderate: Secondary | ICD-10-CM

## 2016-11-02 MED FILL — METOPROLOL SUCC ER 25 MG TA: 25 | 30 days supply | Qty: 30 | Fill #0

## 2016-11-02 MED FILL — LISINOPRIL-HCTZ 10-12.5 MG: 10-12.5 | 30 days supply | Qty: 30 | Fill #0

## 2016-11-14 ENCOUNTER — Ambulatory Visit (INDEPENDENT_AMBULATORY_CARE_PROVIDER_SITE_OTHER): Payer: Managed Care, Other (non HMO) | Admitting: Psychology

## 2016-11-14 DIAGNOSIS — F330 Major depressive disorder, recurrent, mild: Secondary | ICD-10-CM | POA: Diagnosis not present

## 2016-11-28 ENCOUNTER — Ambulatory Visit (INDEPENDENT_AMBULATORY_CARE_PROVIDER_SITE_OTHER): Payer: 59 | Admitting: Psychology

## 2016-11-28 DIAGNOSIS — F331 Major depressive disorder, recurrent, moderate: Secondary | ICD-10-CM

## 2016-12-04 MED FILL — ONDANSETRON HCL 4 MG TABLET: 4 | 5 days supply | Qty: 15 | Fill #0

## 2016-12-05 MED FILL — LISINOPRIL-HCTZ 10-12.5 MG: 10-12.5 | 90 days supply | Qty: 90 | Fill #0

## 2016-12-05 MED FILL — METOPROLOL SUCC ER 25 MG TA: 25 | 90 days supply | Qty: 90 | Fill #0

## 2016-12-12 ENCOUNTER — Ambulatory Visit (INDEPENDENT_AMBULATORY_CARE_PROVIDER_SITE_OTHER): Payer: 59 | Admitting: Psychology

## 2016-12-12 DIAGNOSIS — F331 Major depressive disorder, recurrent, moderate: Secondary | ICD-10-CM | POA: Diagnosis not present

## 2016-12-26 ENCOUNTER — Ambulatory Visit: Payer: 59 | Admitting: Psychology

## 2016-12-26 DIAGNOSIS — F331 Major depressive disorder, recurrent, moderate: Secondary | ICD-10-CM

## 2017-01-09 ENCOUNTER — Ambulatory Visit (INDEPENDENT_AMBULATORY_CARE_PROVIDER_SITE_OTHER): Payer: 59 | Admitting: Psychology

## 2017-01-09 DIAGNOSIS — F331 Major depressive disorder, recurrent, moderate: Secondary | ICD-10-CM | POA: Diagnosis not present

## 2017-01-19 MED FILL — NORETHINDRONE 0.35 MG TAB: 0.35 | 84 days supply | Qty: 84 | Fill #2

## 2017-01-23 ENCOUNTER — Ambulatory Visit: Payer: 59 | Admitting: Psychology

## 2017-01-23 DIAGNOSIS — F331 Major depressive disorder, recurrent, moderate: Secondary | ICD-10-CM

## 2017-02-06 ENCOUNTER — Ambulatory Visit: Payer: 59 | Admitting: Psychology

## 2017-02-06 DIAGNOSIS — F331 Major depressive disorder, recurrent, moderate: Secondary | ICD-10-CM | POA: Diagnosis not present

## 2017-02-14 MED FILL — ONDANSETRON HCL 4 MG TABLET: 4 | 7 days supply | Qty: 28 | Fill #0

## 2017-02-14 MED FILL — PROMETHAZINE 25 MG TABLET: 25 | 7 days supply | Qty: 28 | Fill #0

## 2017-02-14 MED FILL — SUCRALFATE 1 GM TABLET: 1 | 30 days supply | Qty: 120 | Fill #0

## 2017-02-15 ENCOUNTER — Inpatient Hospital Stay (HOSPITAL_BASED_OUTPATIENT_CLINIC_OR_DEPARTMENT_OTHER)
Admission: EM | Admit: 2017-02-15 | Discharge: 2017-02-18 | DRG: 439 | Disposition: A | Discharge status: Home / Self Care | Payer: Managed Care, Other (non HMO) | Attending: Internal Medicine | Admitting: Internal Medicine

## 2017-02-15 ENCOUNTER — Encounter (HOSPITAL_BASED_OUTPATIENT_CLINIC_OR_DEPARTMENT_OTHER): Payer: Self-pay | Admitting: *Deleted

## 2017-02-15 ENCOUNTER — Other Ambulatory Visit: Payer: Self-pay

## 2017-02-15 DIAGNOSIS — E876 Hypokalemia: Secondary | ICD-10-CM | POA: Diagnosis present

## 2017-02-15 DIAGNOSIS — K8500 Idiopathic acute pancreatitis without necrosis or infection: Secondary | ICD-10-CM

## 2017-02-15 DIAGNOSIS — Z6841 Body Mass Index (BMI) 40.0 and over, adult: Secondary | ICD-10-CM

## 2017-02-15 DIAGNOSIS — E785 Hyperlipidemia, unspecified: Secondary | ICD-10-CM | POA: Diagnosis present

## 2017-02-15 DIAGNOSIS — R7302 Impaired glucose tolerance (oral): Secondary | ICD-10-CM | POA: Diagnosis present

## 2017-02-15 DIAGNOSIS — G4730 Sleep apnea, unspecified: Secondary | ICD-10-CM | POA: Diagnosis present

## 2017-02-15 DIAGNOSIS — I10 Essential (primary) hypertension: Secondary | ICD-10-CM | POA: Diagnosis present

## 2017-02-15 DIAGNOSIS — T464X5A Adverse effect of angiotensin-converting-enzyme inhibitors, initial encounter: Secondary | ICD-10-CM | POA: Diagnosis present

## 2017-02-15 DIAGNOSIS — F329 Major depressive disorder, single episode, unspecified: Secondary | ICD-10-CM | POA: Diagnosis present

## 2017-02-15 DIAGNOSIS — K859 Acute pancreatitis without necrosis or infection, unspecified: Secondary | ICD-10-CM | POA: Diagnosis present

## 2017-02-15 DIAGNOSIS — K853 Drug induced acute pancreatitis without necrosis or infection: Principal | ICD-10-CM | POA: Diagnosis present

## 2017-02-15 DIAGNOSIS — T384X5A Adverse effect of oral contraceptives, initial encounter: Secondary | ICD-10-CM | POA: Diagnosis present

## 2017-02-15 LAB — CBC WITH DIFFERENTIAL/PLATELET
BASOS ABS: 0 10*3/uL (ref 0.0–0.1)
Basophils Relative: 0 %
Eosinophils Absolute: 0 10*3/uL (ref 0.0–0.7)
Eosinophils Relative: 0 %
HEMATOCRIT: 38.8 % (ref 36.0–46.0)
Hemoglobin: 13 g/dL (ref 12.0–15.0)
LYMPHS ABS: 1.2 10*3/uL (ref 0.7–4.0)
LYMPHS PCT: 5 %
MCH: 28.5 pg (ref 26.0–34.0)
MCHC: 33.5 g/dL (ref 30.0–36.0)
MCV: 85.1 fL (ref 78.0–100.0)
MONO ABS: 1.4 10*3/uL — AB (ref 0.1–1.0)
Monocytes Relative: 6 %
NEUTROS ABS: 21.5 10*3/uL — AB (ref 1.7–7.7)
Neutrophils Relative %: 89 %
Platelets: 378 10*3/uL (ref 150–400)
RBC: 4.56 MIL/uL (ref 3.87–5.11)
RDW: 14.6 % (ref 11.5–15.5)
WBC: 24.2 10*3/uL — AB (ref 4.0–10.5)

## 2017-02-15 LAB — COMPREHENSIVE METABOLIC PANEL
ALT: 16 U/L (ref 14–54)
AST: 16 U/L (ref 15–41)
Albumin: 3.9 g/dL (ref 3.5–5.0)
Alkaline Phosphatase: 110 U/L (ref 38–126)
Anion gap: 11 (ref 5–15)
BILIRUBIN TOTAL: 1.1 mg/dL (ref 0.3–1.2)
BUN: 11 mg/dL (ref 6–20)
CALCIUM: 8.9 mg/dL (ref 8.9–10.3)
CO2: 26 mmol/L (ref 22–32)
CREATININE: 0.71 mg/dL (ref 0.44–1.00)
Chloride: 97 mmol/L — ABNORMAL LOW (ref 101–111)
GFR calc Af Amer: >60 mL/min (ref >60)
Glucose, Bld: 156 mg/dL — ABNORMAL HIGH (ref 65–99)
Potassium: 3.1 mmol/L — ABNORMAL LOW (ref 3.5–5.1)
Sodium: 134 mmol/L — ABNORMAL LOW (ref 135–145)
TOTAL PROTEIN: 7.9 g/dL (ref 6.5–8.1)

## 2017-02-15 LAB — URINALYSIS, MICROSCOPIC (REFLEX)

## 2017-02-15 LAB — HCG, QUANTITATIVE, PREGNANCY: HCG, BETA CHAIN, QUANT, S: 4 m[IU]/mL (ref <5)

## 2017-02-15 LAB — URINALYSIS, ROUTINE W REFLEX MICROSCOPIC
BILIRUBIN URINE: NEGATIVE
Glucose, UA: NEGATIVE mg/dL
KETONES UR: 15 mg/dL — AB
Leukocytes, UA: NEGATIVE
Nitrite: NEGATIVE
Protein, ur: 30 mg/dL — AB
SPECIFIC GRAVITY, URINE: 1.025 (ref 1.005–1.030)
pH: 6 (ref 5.0–8.0)

## 2017-02-15 LAB — PREGNANCY, URINE: PREG TEST UR: POSITIVE — AB

## 2017-02-15 NOTE — ED Triage Notes (Signed)
abd pain onset wednesday at  1500, nausea w   Vomited x 1 last pm  Denies diarrhea, no ua sx,  Was seen by her md at 1630 last pm

## 2017-02-16 ENCOUNTER — Emergency Department (HOSPITAL_BASED_OUTPATIENT_CLINIC_OR_DEPARTMENT_OTHER): Payer: Managed Care, Other (non HMO)

## 2017-02-16 ENCOUNTER — Encounter (HOSPITAL_COMMUNITY): Payer: Self-pay

## 2017-02-16 DIAGNOSIS — E876 Hypokalemia: Secondary | ICD-10-CM

## 2017-02-16 DIAGNOSIS — K859 Acute pancreatitis without necrosis or infection, unspecified: Secondary | ICD-10-CM | POA: Diagnosis present

## 2017-02-16 DIAGNOSIS — I10 Essential (primary) hypertension: Secondary | ICD-10-CM | POA: Diagnosis not present

## 2017-02-16 DIAGNOSIS — G4730 Sleep apnea, unspecified: Secondary | ICD-10-CM | POA: Diagnosis present

## 2017-02-16 DIAGNOSIS — Z6841 Body Mass Index (BMI) 40.0 and over, adult: Secondary | ICD-10-CM | POA: Diagnosis not present

## 2017-02-16 DIAGNOSIS — F329 Major depressive disorder, single episode, unspecified: Secondary | ICD-10-CM | POA: Diagnosis present

## 2017-02-16 DIAGNOSIS — K8500 Idiopathic acute pancreatitis without necrosis or infection: Secondary | ICD-10-CM

## 2017-02-16 DIAGNOSIS — K853 Drug induced acute pancreatitis without necrosis or infection: Secondary | ICD-10-CM | POA: Diagnosis not present

## 2017-02-16 DIAGNOSIS — E785 Hyperlipidemia, unspecified: Secondary | ICD-10-CM | POA: Diagnosis present

## 2017-02-16 DIAGNOSIS — T384X5A Adverse effect of oral contraceptives, initial encounter: Secondary | ICD-10-CM | POA: Diagnosis present

## 2017-02-16 DIAGNOSIS — R7302 Impaired glucose tolerance (oral): Secondary | ICD-10-CM | POA: Diagnosis present

## 2017-02-16 DIAGNOSIS — E782 Mixed hyperlipidemia: Secondary | ICD-10-CM | POA: Diagnosis not present

## 2017-02-16 DIAGNOSIS — T464X5A Adverse effect of angiotensin-converting-enzyme inhibitors, initial encounter: Secondary | ICD-10-CM | POA: Diagnosis present

## 2017-02-16 HISTORY — DX: Acute pancreatitis without necrosis or infection, unspecified: K85.90

## 2017-02-16 LAB — COMPREHENSIVE METABOLIC PANEL
ALBUMIN: 3.3 g/dL — AB (ref 3.5–5.0)
ALK PHOS: 91 U/L (ref 38–126)
ALT: 13 U/L — AB (ref 14–54)
ANION GAP: 9 (ref 5–15)
AST: 16 U/L (ref 15–41)
BILIRUBIN TOTAL: 1.3 mg/dL — AB (ref 0.3–1.2)
BUN: 9 mg/dL (ref 6–20)
CALCIUM: 8.3 mg/dL — AB (ref 8.9–10.3)
CO2: 26 mmol/L (ref 22–32)
CREATININE: 0.79 mg/dL (ref 0.44–1.00)
Chloride: 100 mmol/L — ABNORMAL LOW (ref 101–111)
GFR calc Af Amer: >60 mL/min (ref >60)
GFR calc non Af Amer: >60 mL/min (ref >60)
GLUCOSE: 142 mg/dL — AB (ref 65–99)
Potassium: 3.1 mmol/L — ABNORMAL LOW (ref 3.5–5.1)
SODIUM: 135 mmol/L (ref 135–145)
TOTAL PROTEIN: 7 g/dL (ref 6.5–8.1)

## 2017-02-16 LAB — RAPID URINE DRUG SCREEN, HOSP PERFORMED
Amphetamines: NOT DETECTED
BARBITURATES: NOT DETECTED
Benzodiazepines: NOT DETECTED
COCAINE: NOT DETECTED
Opiates: NOT DETECTED
TETRAHYDROCANNABINOL: NOT DETECTED

## 2017-02-16 LAB — CBC
HEMATOCRIT: 36.7 % (ref 36.0–46.0)
HEMOGLOBIN: 12.5 g/dL (ref 12.0–15.0)
MCH: 29.3 pg (ref 26.0–34.0)
MCHC: 34.1 g/dL (ref 30.0–36.0)
MCV: 85.9 fL (ref 78.0–100.0)
Platelets: 296 10*3/uL (ref 150–400)
RBC: 4.27 MIL/uL (ref 3.87–5.11)
RDW: 14.7 % (ref 11.5–15.5)
WBC: 20.5 10*3/uL — ABNORMAL HIGH (ref 4.0–10.5)

## 2017-02-16 LAB — LIPID PANEL
Cholesterol: 167 mg/dL (ref 0–200)
HDL: 58 mg/dL (ref >40)
LDL CALC: 99 mg/dL (ref 0–99)
Total CHOL/HDL Ratio: 2.9 RATIO
Triglycerides: 48 mg/dL (ref <150)
VLDL: 10 mg/dL (ref 0–40)

## 2017-02-16 LAB — HIV ANTIBODY (ROUTINE TESTING W REFLEX): HIV SCREEN 4TH GENERATION: NONREACTIVE

## 2017-02-16 LAB — LIPASE, BLOOD: Lipase: 141 U/L — ABNORMAL HIGH (ref 11–51)

## 2017-02-16 LAB — MAGNESIUM: Magnesium: 2 mg/dL (ref 1.7–2.4)

## 2017-02-16 MED ORDER — PIPERACILLIN-TAZOBACTAM 3.375 G IVPB 30 MIN
3.3750 g | Freq: Once | INTRAVENOUS | Status: AC
  Administered 2017-02-16: 3.375 g via INTRAVENOUS
  Filled 2017-02-16 (×2): qty 50

## 2017-02-16 MED ORDER — FENTANYL CITRATE (PF) 100 MCG/2ML IJ SOLN
100.0000 ug | Freq: Once | INTRAMUSCULAR | Status: AC
  Administered 2017-02-16: 100 ug via INTRAVENOUS
  Filled 2017-02-16: qty 2

## 2017-02-16 MED ORDER — ENOXAPARIN SODIUM 60 MG/0.6ML ~~LOC~~ SOLN
55.0000 mg | Freq: Every day | SUBCUTANEOUS | Status: DC
  Administered 2017-02-16 – 2017-02-17 (×2): 55 mg via SUBCUTANEOUS
  Filled 2017-02-16 (×3): qty 0.6

## 2017-02-16 MED ORDER — ONDANSETRON HCL 4 MG/2ML IJ SOLN: 4.0000 mg | Freq: Four times a day (QID) | INTRAMUSCULAR | Status: DC | PRN

## 2017-02-16 MED ORDER — METOPROLOL SUCCINATE ER 25 MG PO TB24
25.0000 mg | ORAL_TABLET | Freq: Every day | ORAL | Status: DC
  Administered 2017-02-16 – 2017-02-18 (×3): 25 mg via ORAL
  Filled 2017-02-16 (×3): qty 1

## 2017-02-16 MED ORDER — PIPERACILLIN-TAZOBACTAM 3.375 G IVPB
3.3750 g | Freq: Three times a day (TID) | INTRAVENOUS | Status: DC
  Administered 2017-02-16: 3.375 g via INTRAVENOUS
  Filled 2017-02-16: qty 50

## 2017-02-16 MED ORDER — FENTANYL CITRATE (PF) 100 MCG/2ML IJ SOLN: 50.0000 ug | INTRAMUSCULAR | Status: DC | PRN

## 2017-02-16 MED ORDER — ACETAMINOPHEN 650 MG RE SUPP: 650.0000 mg | Freq: Four times a day (QID) | RECTAL | Status: DC | PRN

## 2017-02-16 MED ORDER — IOPAMIDOL (ISOVUE-300) INJECTION 61%
100.0000 mL | Freq: Once | INTRAVENOUS | Status: AC | PRN
  Administered 2017-02-16: 100 mL via INTRAVENOUS

## 2017-02-16 MED ORDER — SODIUM CHLORIDE 0.9 % IV SOLN
INTRAVENOUS | Status: DC
  Administered 2017-02-16: 02:00:00 via INTRAVENOUS

## 2017-02-16 MED ORDER — ONDANSETRON HCL 4 MG/2ML IJ SOLN
4.0000 mg | Freq: Once | INTRAMUSCULAR | Status: AC
  Administered 2017-02-16: 4 mg via INTRAVENOUS
  Filled 2017-02-16: qty 2

## 2017-02-16 MED ORDER — ONDANSETRON HCL 4 MG PO TABS
4.0000 mg | ORAL_TABLET | Freq: Four times a day (QID) | ORAL | Status: DC | PRN
Start: 2017-02-16 — End: 2017-02-18

## 2017-02-16 MED ORDER — ACETAMINOPHEN 325 MG PO TABS
650.0000 mg | ORAL_TABLET | Freq: Four times a day (QID) | ORAL | Status: DC | PRN
  Administered 2017-02-16 – 2017-02-17 (×2): 650 mg via ORAL
  Filled 2017-02-16 (×2): qty 2

## 2017-02-16 MED ORDER — SODIUM CHLORIDE 0.9 % IV SOLN: Freq: Once | INTRAVENOUS | Status: DC

## 2017-02-16 MED ORDER — POTASSIUM CHLORIDE IN NACL 40-0.9 MEQ/L-% IV SOLN
INTRAVENOUS | Status: DC
  Administered 2017-02-16 – 2017-02-17 (×3): 125 mL/h via INTRAVENOUS
  Filled 2017-02-16 (×6): qty 1000

## 2017-02-16 MED ORDER — SODIUM CHLORIDE 0.9 % IV BOLUS (SEPSIS)
1000.0000 mL | Freq: Once | INTRAVENOUS | Status: AC
  Administered 2017-02-16: 1000 mL via INTRAVENOUS
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
## 2017-02-16 NOTE — ED Notes (Signed)
ED Provider at bedside. 

## 2017-02-16 NOTE — Plan of Care (Signed)
Patient with acute pancreatitis, unclear cause.  SIRS with WBC 24k and fever, so putting patient on zosyn for now.  Going to [employer] [facility].   Note: Urine preg test positive but quant beta HCG is negative, and patient [facility] for fibroids, and a L ovarian dermoid cyst (nothing malignant on surgical path, I checked).
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
## 2017-02-16 NOTE — ED Notes (Signed)
Patient transported to CT 

## 2017-02-16 NOTE — Progress Notes (Signed)
Pt without nausea or vomiting during the day. Denies abd pain. MD updated and clear liquid diet ordered. Will continue to monitor SRP, RN

## 2017-02-16 NOTE — Progress Notes (Signed)
Rx Brief note: Lovenox Wt=110 kg, CrCl~99 ml/min  Rx adjusted Lovenox to 55 mg daily in pt with BMI=41  Thanks Dorrene German 02/16/2017. 4:51 AM

## 2017-02-16 NOTE — Progress Notes (Signed)
PROGRESS NOTE  Janet [name] AST:[id number] DOB: 1966/01/07 DOA: 02/15/2017 PCP: Vernie Shanks, MD  Brief History:  51 year old female with a history of hypertension, hyperlipidemia, depression presented with 2-day history of epigastric and right upper quadrant abdominal pain with associated nausea and vomiting.  The patient denied any fevers, chills, chest pain, diarrhea, hematochezia, dysuria, hematuria.  She denies any new medications.  She denies any history of alcohol or illegal drug use.  She went to see her primary care provider who diagnosed her with gastritis on February 15, 2017.  Unfortunately, her symptoms persisted, and she presented for further evaluation.  Assessment/Plan: Acute pancreatitis -Likely secondary to norethindrone vs lisinopril/HCTZ -UDS -Bowel rest--remain n.p.o. except for sips -Continue IV fluids -02/16/2017 CT abdomen--negative gallbladder; inflammatory changes to the pancreas distal body and tail; inflammatory changes of descending colon likely extension from pancreatitis -Discontinue Zosyn  -HIV -Triglycerides 48 -Discontinue lisinopril/HCTZ, norethindrone   essential hypertension -Continue metoprolol succinate -Holding lisinopril/HCTZ as discussed above  Hypokalemia -Replete -Add potassium to IV fluids -check mag  Leukocytosis -Likely stress demargination -Low-grade fever likely secondary to acute pancreatitis -Monitor off antibiotics  Falls pregnancy test -Serum hCG =4-->minimal elevation likely secondary to the patient's perimenopausal state  Morbid Obesity -BMI = 41.9  Hyperglycemia -check A1C      Disposition Plan:   Home in 2-3 days  Family Communication:  No Family at bedside  Consultants:  none  Code Status:  FULL   DVT Prophylaxis:  [place] Lovenox  Total time spent 35 minutes.  Greater than 50% spent face to face counseling and coordinating care. 0720 to 0755    Procedures: As Listed in Progress Note  Above  Antibiotics: Zosyn 12/27>>12/28    Subjective: Epigastric and left upper quadrant pain or feeling better but not totally resolved.  She has some nausea without.  She denies fevers, chills, headache, chest pain, shortness breath, diarrhea, hematochezia, melena.  Objective: Vitals:   02/15/17 2034 02/15/17 2303 02/16/17 0223 02/16/17 0353  BP:  120/71 135/79 132/69  Pulse:  75 79 64  Resp:  16 17 18   Temp:    98.8 F (37.1 C)  TempSrc:    Oral  SpO2:  94% 99% 95%  Weight: 111.1 kg (245 lb)   110.8 kg (244 lb 4.3 oz)  Height: 5\' 2"  (1.575 m)   5\' 4"  (1.626 m)    Intake/Output Summary (Last 24 hours) at 02/16/2017 0748 Last data filed at 02/16/2017 0600 Gross per 24 hour  Intake 1504.17 ml  Output -  Net 1504.17 ml   Weight change:  Exam:   General:  Pt is alert, follows commands appropriately, not in acute distress  HEENT: No icterus, No thrush, No neck mass, [place]/AT  Cardiovascular: RRR, S1/S2, no rubs, no gallops  Respiratory: CTA bilaterally, no wheezing, no crackles, no rhonchi  Abdomen: Soft/+BS, non tender, non distended, no guarding  Extremities: No edema, No lymphangitis, No petechiae, No rashes, no synovitis   Data Reviewed: I have personally reviewed following labs and imaging studies Basic Metabolic Panel: Recent Labs  Lab 02/15/17 2128 02/16/17 0557  NA 134* 135  K 3.1* 3.1*  CL 97* 100*  CO2 26 26  GLUCOSE 156* 142*  BUN 11 9  CREATININE 0.71 0.79  CALCIUM 8.9 8.3*   Liver Function Tests: Recent Labs  Lab 02/15/17 2128 02/16/17 0557  AST 16 16  ALT 16 13*  ALKPHOS 110 91  BILITOT 1.1 1.3*  PROT 7.9 7.0  ALBUMIN 3.9 3.3*   Recent Labs  Lab 02/15/17 2128  LIPASE 141*   No results for input(s): AMMONIA in the last 168 hours. Coagulation Profile: No results for input(s): INR, PROTIME in the last 168 hours. CBC: Recent Labs  Lab 02/15/17 2128 02/16/17 0557  WBC 24.2* 20.5*  NEUTROABS 21.5*  --   HGB 13.0 12.5  HCT  38.8 36.7  MCV 85.1 85.9  PLT 378 296   Cardiac Enzymes: No results for input(s): CKTOTAL, CKMB, CKMBINDEX, TROPONINI in the last 168 hours. BNP: Invalid input(s): POCBNP CBG: No results for input(s): GLUCAP in the last 168 hours. HbA1C: No results for input(s): HGBA1C in the last 72 hours. Urine analysis:    Component Value Date/Time   COLORURINE YELLOW 02/15/2017 2049   APPEARANCEUR CLOUDY (A) 02/15/2017 2049   LABSPEC 1.025 02/15/2017 2049   PHURINE 6.0 02/15/2017 2049   GLUCOSEU NEGATIVE 02/15/2017 2049   HGBUR LARGE (A) 02/15/2017 2049   BILIRUBINUR NEGATIVE 02/15/2017 2049   BILIRUBINUR neg 08/31/2011 1013   KETONESUR 15 (A) 02/15/2017 2049   PROTEINUR 30 (A) 02/15/2017 2049   UROBILINOGEN negative 08/31/2011 1013   NITRITE NEGATIVE 02/15/2017 2049   LEUKOCYTESUR NEGATIVE 02/15/2017 2049   Sepsis Labs: @LABRCNTIP (procalcitonin:4,lacticidven:4) )No results found for this or any previous visit (from the past 240 hour(s)).   Scheduled Meds: . enoxaparin (LOVENOX) injection  55 mg Subcutaneous Daily  . metoprolol succinate  25 mg Oral Daily   Continuous Infusions: . sodium chloride    . sodium chloride 125 mL/hr at 02/16/17 0222  . piperacillin-tazobactam (ZOSYN)  IV      Procedures/Studies: Ct Abdomen Pelvis W Contrast  Result Date: 02/16/2017 CLINICAL DATA:  51 year old female with left upper quadrant abdominal pain. EXAM: CT ABDOMEN AND PELVIS WITH CONTRAST TECHNIQUE: Multidetector CT imaging of the abdomen and pelvis was performed using the standard protocol following bolus administration of intravenous contrast. CONTRAST:  133mL ISOVUE-300 IOPAMIDOL (ISOVUE-300) INJECTION 61% COMPARISON:  Abdominal CT dated 08/12/2014 FINDINGS: Lower chest: There are bibasilar atelectasis/ scarring. Small left pleural effusion. No intra-abdominal free air. Upper abdominal mesenteric stranding and small amount of fluid. Hepatobiliary: Apparent mild fatty infiltration of the liver.  No intrahepatic biliary ductal dilatation. The gallbladder is unremarkable. Pancreas: There is inflammatory changes of the pancreas primarily involving the distal body and tail of the pancreas most consistent with acute pancreatitis. No drainable fluid collection or abscess. Spleen: Normal in size without focal abnormality. Adrenals/Urinary Tract: The adrenal glands are unremarkable. There is a 3 mm left renal interpolar nonobstructing stone. No hydronephrosis. The right kidney is unremarkable. The visualized ureters and urinary bladder appear unremarkable. Stomach/Bowel: There is no evidence of bowel obstruction or active inflammation. Inflammatory changes involving the proximal descending colon most likely extension from pancreatitis. Appendectomy. Vascular/Lymphatic: No significant vascular findings are present. No enlarged abdominal or pelvic lymph nodes. Reproductive: Hysterectomy.  No pelvic mass. Other: Midline vertical anterior abdominal wall incisional scar. Musculoskeletal: Degenerative changes of the spine. No acute fracture. IMPRESSION: 1. Acute pancreatitis.  No abscess. 2. Mild fatty liver. 3. No bowel obstruction or active inflammation. 4. Small left pleural effusion. Electronically Signed   By: Anner Crete M.D.   On: 02/16/2017 01:26    Orson Eva, DO  [facility] Pager [phone]  If 7PM-7AM, please contact night-coverage [email or web address] Password [email or web address] 02/16/2017, 7:48 AM   LOS: 0 days

## 2017-02-16 NOTE — Progress Notes (Signed)
Pharmacy Antibiotic Note  Janet [name] is a 51 y.o. female admitted on 02/15/2017 with intra-abdominal infection.  Pharmacy has been consulted for zosyn dosing.  Plan: Zosyn 3.375g IV q8h (4 hour infusion).  Height: 5\' 4"  (162.6 cm) Weight: 244 lb 4.3 oz (110.8 kg) IBW/kg (Calculated) : 54.7  Temp (24hrs), Avg:99.6 F (37.6 C), Min:98.8 F (37.1 C), Max:100.4 F (38 C)  Recent Labs  Lab 02/15/17 2128  WBC 24.2*  CREATININE 0.71    Estimated Creatinine Clearance: 99 mL/min (by C-G formula based on SCr of 0.71 mg/dL).    No Known Allergies  Antimicrobials this admission: 12/28 zosyn >>    >>   Dose adjustments this admission:   Microbiology results:  BCx:   UCx:    Sputum:    MRSA PCR:  Thank you for allowing pharmacy to be a part of this patient's care.  Dorrene German 02/16/2017 4:48 AM

## 2017-02-17 ENCOUNTER — Inpatient Hospital Stay (HOSPITAL_COMMUNITY): Payer: Managed Care, Other (non HMO)

## 2017-02-17 ENCOUNTER — Encounter (HOSPITAL_COMMUNITY): Payer: Self-pay

## 2017-02-17 LAB — HEMOGLOBIN A1C
Hgb A1c MFr Bld: 5.9 % — ABNORMAL HIGH (ref 4.8–5.6)
MEAN PLASMA GLUCOSE: 122.63 mg/dL

## 2017-02-17 LAB — CBC
HCT: 33.3 % — ABNORMAL LOW (ref 36.0–46.0)
Hemoglobin: 10.8 g/dL — ABNORMAL LOW (ref 12.0–15.0)
MCH: 28.3 pg (ref 26.0–34.0)
MCHC: 32.4 g/dL (ref 30.0–36.0)
MCV: 87.4 fL (ref 78.0–100.0)
Platelets: 286 K/uL (ref 150–400)
RBC: 3.81 MIL/uL — ABNORMAL LOW (ref 3.87–5.11)
RDW: 14.8 % (ref 11.5–15.5)
WBC: 16.3 K/uL — ABNORMAL HIGH (ref 4.0–10.5)

## 2017-02-17 LAB — BASIC METABOLIC PANEL WITH GFR
Anion gap: 7 (ref 5–15)
BUN: 10 mg/dL (ref 6–20)
CO2: 25 mmol/L (ref 22–32)
Calcium: 8.2 mg/dL — ABNORMAL LOW (ref 8.9–10.3)
Chloride: 106 mmol/L (ref 101–111)
Creatinine, Ser: 0.71 mg/dL (ref 0.44–1.00)
GFR calc Af Amer: >60 mL/min
GFR calc non Af Amer: >60 mL/min
Glucose, Bld: 115 mg/dL — ABNORMAL HIGH (ref 65–99)
Potassium: 3.7 mmol/L (ref 3.5–5.1)
Sodium: 138 mmol/L (ref 135–145)

## 2017-02-17 MED ORDER — DIPHENHYDRAMINE HCL 50 MG/ML IJ SOLN
12.5000 mg | Freq: Once | INTRAMUSCULAR | Status: AC
  Administered 2017-02-17: 12.5 mg via INTRAVENOUS
  Filled 2017-02-17: qty 1

## 2017-02-17 MED ORDER — PROCHLORPERAZINE EDISYLATE 5 MG/ML IJ SOLN
10.0000 mg | Freq: Once | INTRAMUSCULAR | Status: AC
  Administered 2017-02-17: 10 mg via INTRAVENOUS
  Filled 2017-02-17: qty 2

## 2017-02-17 NOTE — Progress Notes (Signed)
PROGRESS NOTE  Janet [name] [id number]:[id number] DOB: 05/27/1965 DOA: 02/15/2017 PCP: Vernie Shanks, MD  Brief History:  51 year old female with a history of hypertension, hyperlipidemia, depression presented with 2-day history of epigastric and right upper quadrant abdominal pain with associated nausea and vomiting.  The patient denied any fevers, chills, chest pain, diarrhea, hematochezia, dysuria, hematuria.  She denies any new medications.  She denies any history of alcohol or illegal drug use.  She went to see her primary care provider who diagnosed her with gastritis on February 15, 2017.  Unfortunately, her symptoms persisted, and she presented for further evaluation.  Assessment/Plan: Acute pancreatitis -Likely secondary to norethindrone vs lisinopril/HCTZ -UDS--neg -advanced to full liquids -Continue IV fluids -02/16/2017 CT abdomen--negative gallbladder; inflammatory changes to the pancreas distal body and tail; inflammatory changes of descending colon likely extension from pancreatitis -Discontinue Zosyn  -HIV--neg -Triglycerides 48 -Discontinue lisinopril/HCTZ, norethindrone   essential hypertension -Continue metoprolol succinate -Holding lisinopril/HCTZ as discussed above  Hypokalemia -Replete -Add potassium to IV fluids -check mag--2.0  Leukocytosis -Likely stress demargination -Low-grade fever likely secondary to acute pancreatitis -Monitor off antibiotics  Falls pregnancy test -Serum hCG =4-->minimal elevation likely secondary to the patient's perimenopausal state  Morbid Obesity -BMI = 41.9  Impaired Glucose Tolerance -check A1C--5.9      Disposition Plan:   Home 12/30 or 12/31 if stable Family Communication:  No Family at bedside  Consultants:  none  Code Status:  FULL   DVT Prophylaxis:  [place] Lovenox  Total time spent 35 minutes.      Procedures: As Listed in Progress Note Above  Antibiotics: Zosyn  12/27>>12/28       Subjective: Overall, abdominal pain is improving.  She still has some residual left upper quadrant pain.  She denies any nausea, vomiting, diarrhea, chest pain, shortness breath.  She complains of chronic headache.  She denies any fevers, chills.  She is tolerating clear liquids.  Objective: Vitals:   02/16/17 1330 02/17/17 0410 02/17/17 0855 02/17/17 1256  BP: 137/79 136/81 (!) 152/91 130/84  Pulse: 79 78 80 72  Resp:  18    Temp: 99.5 F (37.5 C) 98.4 F (36.9 C)  98.7 F (37.1 C)  TempSrc: Oral Oral  Oral  SpO2: 93% 94%  97%  Weight:      Height:        Intake/Output Summary (Last 24 hours) at 02/17/2017 1621 Last data filed at 02/17/2017 1500 Gross per 24 hour  Intake 3000 ml  Output -  Net 3000 ml   Weight change:  Exam:   General:  Pt is alert, follows commands appropriately, not in acute distress  HEENT: No icterus, No thrush, No neck mass, [place]/AT  Cardiovascular: RRR, S1/S2, no rubs, no gallops  Respiratory: CTA bilaterally, no wheezing, no crackles, no rhonchi  Abdomen: Soft/+BS, mild LUQ tender, non distended, no guarding  Extremities: No edema, No lymphangitis, No petechiae, No rashes, no synovitis   Data Reviewed: I have personally reviewed following labs and imaging studies Basic Metabolic Panel: Recent Labs  Lab 02/15/17 2128 02/16/17 0557 02/17/17 0455  NA 134* 135 138  K 3.1* 3.1* 3.7  CL 97* 100* 106  CO2 26 26 25   GLUCOSE 156* 142* 115*  BUN 11 9 10   CREATININE 0.71 0.79 0.71  CALCIUM 8.9 8.3* 8.2*  MG  --  2.0  --    Liver Function Tests: Recent Labs  Lab 02/15/17 2128 02/16/17 0557  AST 16 16  ALT 16 13*  ALKPHOS 110 91  BILITOT 1.1 1.3*  PROT 7.9 7.0  ALBUMIN 3.9 3.3*   Recent Labs  Lab 02/15/17 2128  LIPASE 141*   No results for input(s): AMMONIA in the last 168 hours. Coagulation Profile: No results for input(s): INR, PROTIME in the last 168 hours. CBC: Recent Labs  Lab 02/15/17 2128  02/16/17 0557 02/17/17 0455  WBC 24.2* 20.5* 16.3*  NEUTROABS 21.5*  --   --   HGB 13.0 12.5 10.8*  HCT 38.8 36.7 33.3*  MCV 85.1 85.9 87.4  PLT 378 296 286   Cardiac Enzymes: No results for input(s): CKTOTAL, CKMB, CKMBINDEX, TROPONINI in the last 168 hours. BNP: Invalid input(s): POCBNP CBG: No results for input(s): GLUCAP in the last 168 hours. HbA1C: Recent Labs    02/17/17 0455  HGBA1C 5.9*   Urine analysis:    Component Value Date/Time   COLORURINE YELLOW 02/15/2017 2049   APPEARANCEUR CLOUDY (A) 02/15/2017 2049   LABSPEC 1.025 02/15/2017 2049   PHURINE 6.0 02/15/2017 2049   GLUCOSEU NEGATIVE 02/15/2017 2049   HGBUR LARGE (A) 02/15/2017 2049   BILIRUBINUR NEGATIVE 02/15/2017 2049   BILIRUBINUR neg 08/31/2011 1013   KETONESUR 15 (A) 02/15/2017 2049   PROTEINUR 30 (A) 02/15/2017 2049   UROBILINOGEN negative 08/31/2011 1013   NITRITE NEGATIVE 02/15/2017 2049   LEUKOCYTESUR NEGATIVE 02/15/2017 2049   Sepsis Labs: @LABRCNTIP (procalcitonin:4,lacticidven:4) )No results found for this or any previous visit (from the past 240 hour(s)).   Scheduled Meds: . enoxaparin (LOVENOX) injection  55 mg Subcutaneous Daily  . metoprolol succinate  25 mg Oral Daily   Continuous Infusions: . 0.9 % NaCl with KCl 40 mEq / L 125 mL/hr (02/17/17 0902)    Procedures/Studies: Ct Head Wo Contrast  Result Date: 02/17/2017 CLINICAL DATA:  Headache since 02/14/2017 EXAM: CT HEAD WITHOUT CONTRAST TECHNIQUE: Contiguous axial images were obtained from the base of the skull through the vertex without intravenous contrast. COMPARISON:  9/9/ 16 FINDINGS: Brain: Fluid attenuating structure within the left anterior middle cranial fossa is identified and appears similar to previous exam compatible with bone arachnoid cyst. This measures 3.9 by 2.1 cm. The cerebral and cerebellar hemispheres are otherwise normal in attenuation and morphology. No evidence for acute brain infarct, intracranial  hemorrhage or mass. No hydrocephalus. Vascular: No hyperdense vessel or unexpected calcification. Skull: Normal. Negative for fracture or focal lesion. Sinuses/Orbits: No acute finding. Other: None. IMPRESSION: 1. No acute intracranial abnormalities. 2. 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 Electronically Signed   By: Kerby Moors M.D.   On: 02/17/2017 15:31   Ct Abdomen Pelvis W Contrast  Result Date: 02/16/2017 CLINICAL DATA:  51 year old female with left upper quadrant abdominal pain. EXAM: CT ABDOMEN AND PELVIS WITH CONTRAST TECHNIQUE: Multidetector CT imaging of the abdomen and pelvis was performed using the standard protocol following bolus administration of intravenous contrast. CONTRAST:  132mL ISOVUE-300 IOPAMIDOL (ISOVUE-300) INJECTION 61% COMPARISON:  Abdominal CT dated 08/12/2014 FINDINGS: Lower chest: There are bibasilar atelectasis/ scarring. Small left pleural effusion. No intra-abdominal free air. Upper abdominal mesenteric stranding and small amount of fluid. Hepatobiliary: Apparent mild fatty infiltration of the liver. No intrahepatic biliary ductal dilatation. The gallbladder is unremarkable. Pancreas: There is inflammatory changes of the pancreas primarily involving the distal body and tail of the pancreas most consistent with acute pancreatitis. No drainable fluid collection or abscess. Spleen: Normal in size without focal abnormality. Adrenals/Urinary Tract: The adrenal glands are unremarkable.  There is a 3 mm left renal interpolar nonobstructing stone. No hydronephrosis. The right kidney is unremarkable. The visualized ureters and urinary bladder appear unremarkable. Stomach/Bowel: There is no evidence of bowel obstruction or active inflammation. Inflammatory changes involving the proximal descending colon most likely extension from pancreatitis. Appendectomy. Vascular/Lymphatic: No significant vascular findings are present. No enlarged abdominal or pelvic  lymph nodes. Reproductive: Hysterectomy.  No pelvic mass. Other: Midline vertical anterior abdominal wall incisional scar. Musculoskeletal: Degenerative changes of the spine. No acute fracture. IMPRESSION: 1. Acute pancreatitis.  No abscess. 2. Mild fatty liver. 3. No bowel obstruction or active inflammation. 4. Small left pleural effusion. Electronically Signed   By: Anner Crete M.D.   On: 02/16/2017 01:26    Orson Eva, DO  [facility] Pager [phone]  If 7PM-7AM, please contact night-coverage [email or web address] Password [email or web address] 02/17/2017, 4:21 PM   LOS: 1 day

## 2017-02-17 NOTE — Progress Notes (Signed)
Pt c/o headache--BP 152/91 (110) HR 80. Will update MD. SRP, RN

## 2017-02-18 LAB — BASIC METABOLIC PANEL
ANION GAP: 7 (ref 5–15)
BUN: 8 mg/dL (ref 6–20)
CALCIUM: 8 mg/dL — AB (ref 8.9–10.3)
CO2: 24 mmol/L (ref 22–32)
CREATININE: 0.7 mg/dL (ref 0.44–1.00)
Chloride: 105 mmol/L (ref 101–111)
Glucose, Bld: 119 mg/dL — ABNORMAL HIGH (ref 65–99)
Potassium: 4.2 mmol/L (ref 3.5–5.1)
SODIUM: 136 mmol/L (ref 135–145)

## 2017-02-18 LAB — CBC
HEMATOCRIT: 31 % — AB (ref 36.0–46.0)
Hemoglobin: 10.4 g/dL — ABNORMAL LOW (ref 12.0–15.0)
MCH: 29.1 pg (ref 26.0–34.0)
MCHC: 33.5 g/dL (ref 30.0–36.0)
MCV: 86.6 fL (ref 78.0–100.0)
PLATELETS: 260 10*3/uL (ref 150–400)
RBC: 3.58 MIL/uL — ABNORMAL LOW (ref 3.87–5.11)
RDW: 14.6 % (ref 11.5–15.5)
WBC: 12.7 10*3/uL — AB (ref 4.0–10.5)

## 2017-02-18 MED ORDER — METOPROLOL SUCCINATE ER 50 MG PO TB24: 50.0000 mg | ORAL_TABLET | Freq: Every day | ORAL | Status: DC

## 2017-02-18 MED ORDER — SENNOSIDES-DOCUSATE SODIUM 8.6-50 MG PO TABS
2.0000 | ORAL_TABLET | Freq: Every day | ORAL | Status: DC
  Administered 2017-02-18: 2 via ORAL
  Filled 2017-02-18: qty 2

## 2017-02-18 MED ORDER — METOPROLOL SUCCINATE ER 50 MG PO TB24: 50.0000 mg | ORAL_TABLET | Freq: Every day | ORAL | 1 refills | Status: DC

## 2017-02-18 MED ORDER — DOCUSATE SODIUM 100 MG PO CAPS
100.0000 mg | ORAL_CAPSULE | Freq: Two times a day (BID) | ORAL | Status: DC
  Administered 2017-02-18: 100 mg via ORAL
  Filled 2017-02-18: qty 1

## 2017-02-18 NOTE — Plan of Care (Signed)
Discharge instructions reviewed with patient, questions answered, verbalized understanding.  Patient awaiting ride.

## 2017-02-18 NOTE — Discharge Summary (Signed)
Physician Discharge Summary  Janet [name] [id number]:[id number] DOB: [birth year] DOA: 02/15/2017  PCP: Janet Shanks, MD  Admit date: 02/15/2017 Discharge date: 02/18/2017  Admitted From: Home Disposition:  Home / SNF  Recommendations for Outpatient Follow-up:  1. Follow up with PCP in 1-2 weeks 2. Please obtain BMP/CBC in one week 3. Please follow up on the following pending results:   Discharge Condition: Stable CODE STATUS:FULL Diet recommendation: Heart Healthy    Brief/Interim Summary: 51 year old female with a history of hypertension, hyperlipidemia, depression presented with 2-day history of epigastric and right upper quadrant abdominal pain with associated nausea and vomiting. The patient denied any fevers, chills, chest pain, diarrhea, hematochezia, dysuria, hematuria. She denies any new medications. She denies any history of alcohol or illegal drug use. She went to see her primary care provider who diagnosed her with gastritis on February 15, 2017. Unfortunately, her symptoms persisted, and she presented for further evaluation.    Discharge Diagnoses:   Acute pancreatitis -Likely secondary to norethindroneandlisinopril/HCTZ -initially placed on bowel rest and then slowly advanced diet -UDS--neg -advanced to full liquids>>>soft diet which pt tolerated -Continued IV fluids -02/16/2017 CT abdomen--negative gallbladder;inflammatory changes to the pancreas distal body and tail;inflammatory changes of descending colon likely extension from pancreatitis -Discontinue Zosyn -HIV--neg -Triglycerides 48 -Discontinue lisinopril/HCTZ, norethindrone   essential hypertension -Continue metoprolol succinate--increase dose to 50 mg daily -Holding lisinopril/HCTZ as discussed above--will not restart  Hypokalemia -Repleted -Add potassium to IV fluids -check mag--2.0  Leukocytosis -Likely stress demargination -Low-grade fever likely secondary to acute  pancreatitis -Monitor off antibiotics-->WBC decreasing  Falls pregnancy test -Serum hCG=4-->minimal elevation secondary to the patient's perimenopausal state  Morbid Obesity -BMI = 41.9  Impaired Glucose Tolerance -check A1C--5.9    Discharge Instructions   Allergies as of 02/18/2017   No Known Allergies     Medication List    STOP taking these medications   lisinopril-hydrochlorothiazide 10-12.5 MG tablet Commonly known as:  PRINZIDE,ZESTORETIC     TAKE these medications   metoprolol succinate 50 MG 24 hr tablet Commonly known as:  TOPROL-XL Take 1 tablet (50 mg total) by mouth daily. Take with or immediately following a meal. Start taking on:  02/19/2017 What changed:    medication strength  how much to take  additional instructions       No Known Allergies  Consultations:  none   Procedures/Studies: Ct Head Wo Contrast  Result Date: 02/17/2017 CLINICAL DATA:  Headache since 02/14/2017 EXAM: CT HEAD WITHOUT CONTRAST TECHNIQUE: Contiguous axial images were obtained from the base of the skull through the vertex without intravenous contrast. COMPARISON:  9/9/ 16 FINDINGS: Brain: Fluid attenuating structure within the left anterior middle cranial fossa is identified and appears similar to previous exam compatible with bone arachnoid cyst. This measures 3.9 by 2.1 cm. The cerebral and cerebellar hemispheres are otherwise normal in attenuation and morphology. No evidence for acute brain infarct, intracranial hemorrhage or mass. No hydrocephalus. Vascular: No hyperdense vessel or unexpected calcification. Skull: Normal. Negative for fracture or focal lesion. Sinuses/Orbits: No acute finding. Other: None. IMPRESSION: 1. No acute intracranial abnormalities. 2. 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 Electronically Signed   By: Kerby Moors M.D.   On: 02/17/2017 15:31   Ct Abdomen Pelvis W Contrast  Result Date: 02/16/2017 CLINICAL  DATA:  51 year old female with left upper quadrant abdominal pain. EXAM: CT ABDOMEN AND PELVIS WITH CONTRAST TECHNIQUE: Multidetector CT imaging of the abdomen and pelvis was performed using the standard protocol following  bolus administration of intravenous contrast. CONTRAST:  187mL ISOVUE-300 IOPAMIDOL (ISOVUE-300) INJECTION 61% COMPARISON:  Abdominal CT dated 08/12/2014 FINDINGS: Lower chest: There are bibasilar atelectasis/ scarring. Small left pleural effusion. No intra-abdominal free air. Upper abdominal mesenteric stranding and small amount of fluid. Hepatobiliary: Apparent mild fatty infiltration of the liver. No intrahepatic biliary ductal dilatation. The gallbladder is unremarkable. Pancreas: There is inflammatory changes of the pancreas primarily involving the distal body and tail of the pancreas most consistent with acute pancreatitis. No drainable fluid collection or abscess. Spleen: Normal in size without focal abnormality. Adrenals/Urinary Tract: The adrenal glands are unremarkable. There is a 3 mm left renal interpolar nonobstructing stone. No hydronephrosis. The right kidney is unremarkable. The visualized ureters and urinary bladder appear unremarkable. Stomach/Bowel: There is no evidence of bowel obstruction or active inflammation. Inflammatory changes involving the proximal descending colon most likely extension from pancreatitis. Appendectomy. Vascular/Lymphatic: No significant vascular findings are present. No enlarged abdominal or pelvic lymph nodes. Reproductive: Hysterectomy.  No pelvic mass. Other: Midline vertical anterior abdominal wall incisional scar. Musculoskeletal: Degenerative changes of the spine. No acute fracture. IMPRESSION: 1. Acute pancreatitis.  No abscess. 2. Mild fatty liver. 3. No bowel obstruction or active inflammation. 4. Small left pleural effusion. Electronically Signed   By: Anner Crete M.D.   On: 02/16/2017 01:26        Discharge Exam: Vitals:    02/18/17 1036 02/18/17 1421  BP: (!) 146/91 129/65  Pulse: 76 89  Resp:  20  Temp:  98.7 F (37.1 C)  SpO2: 96% 97%   Vitals:   02/17/17 2033 02/18/17 0439 02/18/17 1036 02/18/17 1421  BP: (!) 143/77 (!) 143/72 (!) 146/91 129/65  Pulse: 86 79 76 89  Resp: 20 20  20   Temp: 100.2 F (37.9 C) 98.9 F (37.2 C)  98.7 F (37.1 C)  TempSrc: Oral Oral  Oral  SpO2: 96% 91% 96% 97%  Weight:      Height:        General: Pt is alert, awake, not in acute distress Cardiovascular: RRR, S1/S2 +, no rubs, no gallops Respiratory: CTA bilaterally, no wheezing, no rhonchi Abdominal: Soft, NT, ND, bowel sounds + Extremities: no edema, no cyanosis   The results of significant diagnostics from this hospitalization (including imaging, microbiology, ancillary and laboratory) are listed below for reference.    Significant Diagnostic Studies: Ct Head Wo Contrast  Result Date: 02/17/2017 CLINICAL DATA:  Headache since 02/14/2017 EXAM: CT HEAD WITHOUT CONTRAST TECHNIQUE: Contiguous axial images were obtained from the base of the skull through the vertex without intravenous contrast. COMPARISON:  9/9/ 16 FINDINGS: Brain: Fluid attenuating structure within the left anterior middle cranial fossa is identified and appears similar to previous exam compatible with bone arachnoid cyst. This measures 3.9 by 2.1 cm. The cerebral and cerebellar hemispheres are otherwise normal in attenuation and morphology. No evidence for acute brain infarct, intracranial hemorrhage or mass. No hydrocephalus. Vascular: No hyperdense vessel or unexpected calcification. Skull: Normal. Negative for fracture or focal lesion. Sinuses/Orbits: No acute finding. Other: None. IMPRESSION: 1. No acute intracranial abnormalities. 2. 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 Electronically Signed   By: Kerby Moors M.D.   On: 02/17/2017 15:31   Ct Abdomen Pelvis W Contrast  Result Date: 02/16/2017 CLINICAL DATA:   51 year old female with left upper quadrant abdominal pain. EXAM: CT ABDOMEN AND PELVIS WITH CONTRAST TECHNIQUE: Multidetector CT imaging of the abdomen and pelvis was performed using the standard protocol following  bolus administration of intravenous contrast. CONTRAST:  170mL ISOVUE-300 IOPAMIDOL (ISOVUE-300) INJECTION 61% COMPARISON:  Abdominal CT dated 08/12/2014 FINDINGS: Lower chest: There are bibasilar atelectasis/ scarring. Small left pleural effusion. No intra-abdominal free air. Upper abdominal mesenteric stranding and small amount of fluid. Hepatobiliary: Apparent mild fatty infiltration of the liver. No intrahepatic biliary ductal dilatation. The gallbladder is unremarkable. Pancreas: There is inflammatory changes of the pancreas primarily involving the distal body and tail of the pancreas most consistent with acute pancreatitis. No drainable fluid collection or abscess. Spleen: Normal in size without focal abnormality. Adrenals/Urinary Tract: The adrenal glands are unremarkable. There is a 3 mm left renal interpolar nonobstructing stone. No hydronephrosis. The right kidney is unremarkable. The visualized ureters and urinary bladder appear unremarkable. Stomach/Bowel: There is no evidence of bowel obstruction or active inflammation. Inflammatory changes involving the proximal descending colon most likely extension from pancreatitis. Appendectomy. Vascular/Lymphatic: No significant vascular findings are present. No enlarged abdominal or pelvic lymph nodes. Reproductive: Hysterectomy.  No pelvic mass. Other: Midline vertical anterior abdominal wall incisional scar. Musculoskeletal: Degenerative changes of the spine. No acute fracture. IMPRESSION: 1. Acute pancreatitis.  No abscess. 2. Mild fatty liver. 3. No bowel obstruction or active inflammation. 4. Small left pleural effusion. Electronically Signed   By: Anner Crete M.D.   On: 02/16/2017 01:26     Microbiology: No results found for this or  any previous visit (from the past 240 hour(s)).   Labs: Basic Metabolic Panel: Recent Labs  Lab 02/15/17 2128 02/16/17 0557 02/17/17 0455 02/18/17 0359  NA 134* 135 138 136  K 3.1* 3.1* 3.7 4.2  CL 97* 100* 106 105  CO2 26 26 25 24   GLUCOSE 156* 142* 115* 119*  BUN 11 9 10 8   CREATININE 0.71 0.79 0.71 0.70  CALCIUM 8.9 8.3* 8.2* 8.0*  MG  --  2.0  --   --    Liver Function Tests: Recent Labs  Lab 02/15/17 2128 02/16/17 0557  AST 16 16  ALT 16 13*  ALKPHOS 110 91  BILITOT 1.1 1.3*  PROT 7.9 7.0  ALBUMIN 3.9 3.3*   Recent Labs  Lab 02/15/17 2128  LIPASE 141*   No results for input(s): AMMONIA in the last 168 hours. CBC: Recent Labs  Lab 02/15/17 2128 02/16/17 0557 02/17/17 0455 02/18/17 0359  WBC 24.2* 20.5* 16.3* 12.7*  NEUTROABS 21.5*  --   --   --   HGB 13.0 12.5 10.8* 10.4*  HCT 38.8 36.7 33.3* 31.0*  MCV 85.1 85.9 87.4 86.6  PLT 378 296 286 260   Cardiac Enzymes: No results for input(s): CKTOTAL, CKMB, CKMBINDEX, TROPONINI in the last 168 hours. BNP: Invalid input(s): POCBNP CBG: No results for input(s): GLUCAP in the last 168 hours.  Time coordinating discharge:  Greater than 30 minutes  Signed:  Orson Eva, DO [facility] Pager: [phone] 02/18/2017, 4:01 PM

## 2017-02-19 MED FILL — METOPROLOL SUCC ER 50 MG TA: 50 | 30 days supply | Qty: 30 | Fill #0

## 2017-03-06 ENCOUNTER — Ambulatory Visit (INDEPENDENT_AMBULATORY_CARE_PROVIDER_SITE_OTHER): Payer: 59 | Admitting: Psychology

## 2017-03-06 DIAGNOSIS — F331 Major depressive disorder, recurrent, moderate: Secondary | ICD-10-CM

## 2017-03-13 MED FILL — METOPROLOL SUCC ER 25 MG TA: 25 | 90 days supply | Qty: 90 | Fill #1

## 2017-03-15 ENCOUNTER — Encounter: Payer: Self-pay | Admitting: Podiatry

## 2017-03-15 ENCOUNTER — Ambulatory Visit: Payer: Managed Care, Other (non HMO) | Admitting: Podiatry

## 2017-03-15 DIAGNOSIS — M21969 Unspecified acquired deformity of unspecified lower leg: Secondary | ICD-10-CM | POA: Diagnosis not present

## 2017-03-15 DIAGNOSIS — M216X2 Other acquired deformities of left foot: Secondary | ICD-10-CM | POA: Diagnosis not present

## 2017-03-15 DIAGNOSIS — M216X1 Other acquired deformities of right foot: Secondary | ICD-10-CM

## 2017-03-15 DIAGNOSIS — M722 Plantar fascial fibromatosis: Secondary | ICD-10-CM

## 2017-03-15 NOTE — Progress Notes (Signed)
Subjective: 52 y.o. year old female patient presents complaining of pain under the first MPJ right foot with occasional heel pain bilateral. Wearing orthotics since May 2015. Also having on going ingrown nail problem.  Positive history of Cotton osteotomy with bone graft, TAL, McBride bunionectomy right foot February 2015.  Objective: Dermatologic: Normal findings. Vascular: Pedal pulses are all palpable. Orthopedic: Hypermobile first ray bilateral. Excess STJ pronation bilateral.Excess sagittal plane motion first ray bilateral. S/P Barbee Cough right February 2015. Neurologic: All epicritic and tactile sensations grossly intact.  Radiographic examination reveal joint space narrowing of the first MPJ right, fibular sesamoid position at 7 on right foot. Left foot fibular sesamoid position is at 4. Right foot has well incorporated bone graft in the middle of the first cuneiform bone and good arch formation compared to the left side. Severe lateral deviation of the CCJ left foot. Plantar calcaneal spur with midtarsal sagging on left foot.  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 STJ hyperpronation bilateral. Excess sagittal plane motion first ray bilateral. Plantar fasciitis bilateral.  Treatment: Reviewed clinical findings. Both feet casted for Orthotics. Patient will return to have ingrown nail surgery in near future.

## 2017-03-15 NOTE — Patient Instructions (Signed)
Pain at the first MPJ right foot with weight bearing. Both feet casted for Orthotics. May return to have ingrown nail surgery if nails become symptomatic.

## 2017-03-20 ENCOUNTER — Ambulatory Visit (INDEPENDENT_AMBULATORY_CARE_PROVIDER_SITE_OTHER): Payer: 59 | Admitting: Psychology

## 2017-03-20 DIAGNOSIS — F331 Major depressive disorder, recurrent, moderate: Secondary | ICD-10-CM

## 2017-04-03 ENCOUNTER — Ambulatory Visit: Payer: 59 | Admitting: Psychology

## 2017-04-03 DIAGNOSIS — F331 Major depressive disorder, recurrent, moderate: Secondary | ICD-10-CM | POA: Diagnosis not present

## 2017-04-05 MED FILL — METOPROLOL SUCC ER 50 MG TA: 50 | 30 days supply | Qty: 45 | Fill #0

## 2017-04-11 ENCOUNTER — Telehealth: Payer: Self-pay

## 2017-04-11 NOTE — Telephone Encounter (Signed)
Pt picked up foot orthotics today.

## 2017-04-17 ENCOUNTER — Ambulatory Visit: Payer: Self-pay | Admitting: Psychology

## 2017-05-01 ENCOUNTER — Ambulatory Visit (INDEPENDENT_AMBULATORY_CARE_PROVIDER_SITE_OTHER): Payer: 59 | Admitting: Psychology

## 2017-05-01 DIAGNOSIS — F331 Major depressive disorder, recurrent, moderate: Secondary | ICD-10-CM

## 2017-05-15 ENCOUNTER — Ambulatory Visit (INDEPENDENT_AMBULATORY_CARE_PROVIDER_SITE_OTHER): Payer: 59 | Admitting: Psychology

## 2017-05-15 DIAGNOSIS — F331 Major depressive disorder, recurrent, moderate: Secondary | ICD-10-CM | POA: Diagnosis not present

## 2017-05-17 MED FILL — NORETHINDRONE 0.35 MG TAB: 0.35 | 84 days supply | Qty: 84 | Fill #3

## 2017-05-18 MED FILL — METOPROLOL SUCCINATE ER 100: 100 | 30 days supply | Qty: 30 | Fill #0

## 2017-05-29 ENCOUNTER — Ambulatory Visit (INDEPENDENT_AMBULATORY_CARE_PROVIDER_SITE_OTHER): Payer: 59 | Admitting: Psychology

## 2017-05-29 DIAGNOSIS — F331 Major depressive disorder, recurrent, moderate: Secondary | ICD-10-CM | POA: Diagnosis not present

## 2017-06-12 ENCOUNTER — Ambulatory Visit (INDEPENDENT_AMBULATORY_CARE_PROVIDER_SITE_OTHER): Payer: 59 | Admitting: Psychology

## 2017-06-12 DIAGNOSIS — F331 Major depressive disorder, recurrent, moderate: Secondary | ICD-10-CM

## 2017-06-18 MED FILL — CITALOPRAM HBR 10 MG TABLET: 10 | 30 days supply | Qty: 30 | Fill #0

## 2017-06-21 ENCOUNTER — Ambulatory Visit: Payer: 59 | Admitting: Psychology

## 2017-06-25 MED FILL — METOPROLOL SUCCINATE ER 100: 100 | 30 days supply | Qty: 30 | Fill #1

## 2017-06-26 ENCOUNTER — Ambulatory Visit: Payer: Self-pay | Admitting: Psychology

## 2017-07-10 ENCOUNTER — Ambulatory Visit (INDEPENDENT_AMBULATORY_CARE_PROVIDER_SITE_OTHER): Payer: 59 | Admitting: Psychology

## 2017-07-10 DIAGNOSIS — F331 Major depressive disorder, recurrent, moderate: Secondary | ICD-10-CM | POA: Diagnosis not present

## 2017-07-24 ENCOUNTER — Ambulatory Visit (INDEPENDENT_AMBULATORY_CARE_PROVIDER_SITE_OTHER): Payer: 59 | Admitting: Psychology

## 2017-07-24 DIAGNOSIS — F331 Major depressive disorder, recurrent, moderate: Secondary | ICD-10-CM

## 2017-07-30 MED FILL — METOPROLOL SUCCINATE ER 100: 100 | 30 days supply | Qty: 30 | Fill #2

## 2017-08-03 ENCOUNTER — Other Ambulatory Visit: Payer: Self-pay | Admitting: Obstetrics & Gynecology

## 2017-08-03 MED FILL — NORETHINDRONE 0.35 MG TAB: 0.35 | 84 days supply | Qty: 84 | Fill #0

## 2017-08-03 NOTE — Telephone Encounter (Signed)
Annual appointment on 10/25/17.

## 2017-08-07 ENCOUNTER — Ambulatory Visit (INDEPENDENT_AMBULATORY_CARE_PROVIDER_SITE_OTHER): Payer: 59 | Admitting: Psychology

## 2017-08-07 DIAGNOSIS — F331 Major depressive disorder, recurrent, moderate: Secondary | ICD-10-CM

## 2017-08-21 ENCOUNTER — Ambulatory Visit (INDEPENDENT_AMBULATORY_CARE_PROVIDER_SITE_OTHER): Payer: 59 | Admitting: Psychology

## 2017-08-21 DIAGNOSIS — F331 Major depressive disorder, recurrent, moderate: Secondary | ICD-10-CM

## 2017-08-30 MED FILL — METOPROLOL SUCCINATE ER 100: 100 | 30 days supply | Qty: 30 | Fill #3

## 2017-09-04 ENCOUNTER — Ambulatory Visit: Payer: 59 | Admitting: Psychology

## 2017-09-17 ENCOUNTER — Telehealth: Payer: Self-pay | Admitting: *Deleted

## 2017-09-17 ENCOUNTER — Other Ambulatory Visit: Payer: Self-pay | Admitting: Obstetrics & Gynecology

## 2017-09-17 DIAGNOSIS — Z1231 Encounter for screening mammogram for malignant neoplasm of breast: Secondary | ICD-10-CM

## 2017-09-17 NOTE — Telephone Encounter (Signed)
Patient called asked if screening 3-D mammogram is recommended by providers. I explained to patient yes more more detailed imaging. Patient said she is going to check with her insurance to see if covered by them before scheduling.

## 2017-09-18 ENCOUNTER — Ambulatory Visit (INDEPENDENT_AMBULATORY_CARE_PROVIDER_SITE_OTHER): Payer: 59 | Admitting: Psychology

## 2017-09-18 DIAGNOSIS — F331 Major depressive disorder, recurrent, moderate: Secondary | ICD-10-CM

## 2017-09-20 ENCOUNTER — Ambulatory Visit (HOSPITAL_BASED_OUTPATIENT_CLINIC_OR_DEPARTMENT_OTHER)
Admission: RE | Admit: 2017-09-20 | Discharge: 2017-09-20 | Disposition: A | Discharge status: Home / Self Care | Payer: 59 | Source: Ambulatory Visit | Attending: Obstetrics & Gynecology | Admitting: Obstetrics & Gynecology

## 2017-09-20 DIAGNOSIS — Z1231 Encounter for screening mammogram for malignant neoplasm of breast: Secondary | ICD-10-CM

## 2017-09-20 MED FILL — CITALOPRAM HBR 10 MG TABLET: 10 | 30 days supply | Qty: 30 | Fill #1

## 2017-09-26 MED FILL — METOPROLOL SUCCINATE ER 100: 100 | 30 days supply | Qty: 30 | Fill #4

## 2017-10-02 ENCOUNTER — Ambulatory Visit (INDEPENDENT_AMBULATORY_CARE_PROVIDER_SITE_OTHER): Payer: 59 | Admitting: Psychology

## 2017-10-02 DIAGNOSIS — F331 Major depressive disorder, recurrent, moderate: Secondary | ICD-10-CM | POA: Diagnosis not present

## 2017-10-16 ENCOUNTER — Ambulatory Visit (INDEPENDENT_AMBULATORY_CARE_PROVIDER_SITE_OTHER): Payer: 59 | Admitting: Psychology

## 2017-10-16 DIAGNOSIS — F331 Major depressive disorder, recurrent, moderate: Secondary | ICD-10-CM | POA: Diagnosis not present

## 2017-10-25 ENCOUNTER — Encounter: Payer: Self-pay | Admitting: Obstetrics & Gynecology

## 2017-10-25 ENCOUNTER — Ambulatory Visit (INDEPENDENT_AMBULATORY_CARE_PROVIDER_SITE_OTHER): Payer: 59 | Admitting: Obstetrics & Gynecology

## 2017-10-25 VITALS — BP 134/86 | Ht 64.0 in | Wt 254.0 lb

## 2017-10-25 DIAGNOSIS — Z01419 Encounter for gynecological examination (general) (routine) without abnormal findings: Secondary | ICD-10-CM

## 2017-10-25 DIAGNOSIS — Z90710 Acquired absence of both cervix and uterus: Secondary | ICD-10-CM

## 2017-10-25 DIAGNOSIS — N951 Menopausal and female climacteric states: Secondary | ICD-10-CM

## 2017-10-25 DIAGNOSIS — Z6841 Body Mass Index (BMI) 40.0 and over, adult: Secondary | ICD-10-CM | POA: Diagnosis not present

## 2017-10-25 DIAGNOSIS — N644 Mastodynia: Secondary | ICD-10-CM

## 2017-10-25 NOTE — Patient Instructions (Signed)
1. Encounter for Papanicolaou smear of vagina as part of routine gynecological examination Gynecologic exam status post total laparoscopic hysterectomy and left oophorectomy.  Pap reflex done on the vaginal vault.  Breast exam normal, but voluminous breasts.  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  Colonoscopy 5 years ago. - Pap IG w/ reflex to HPV when ASC-U  2. Perimenopause Probably in perimenopause based on increasing hot flashes and night sweats, but still having cyclic breast pains.  Will do an Green to evaluate menopausal status. - FSH  3. H/O total hysterectomy  4. Breast pain in female Voluminous breasts with tenderness and pain.  Refer to Dr. Harlow Mares for bilateral breast reduction.  5. Class 3 severe obesity due to excess calories without serious comorbidity with body mass index (BMI) of 40.0 to 44.9 in adult [facility]) Body mass index 43.6.  Recommend low calorie/low carb diet such as [employer].  Recommend aerobic physical activity 5 times a week and weightlifting every 2 days.  Janet [name], it was a pleasure seeing you today!  I will inform you of your results as soon as they are available.

## 2017-10-25 NOTE — Progress Notes (Signed)
[place] 02-09-1966 [id number]   History:    52 y.o. G3P2A1L2  Divorced.  Daughters doing very well. Oldest in [place].  RP:  Established patient presenting for annual gyn exam   HPI: Status post robotic TLH with bilateral salpingectomy and left oophorectomy in June 2017.  Frequent hot flushes and night sweats but also experiencing cyclic bilateral breast pain.  No pelvic pain.  Abstinent.  Urine and bowel movements normal.  Body mass index 43.6.  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  Past medical history,surgical history, family history and social history were all reviewed and documented in the EPIC chart.  Gynecologic History Patient's last menstrual period was 07/22/2015. Contraception: status post hysterectomy Last Pap: No recent Pap Last mammogram: 09/2017. Results were: Negative Bone Density: Never Colonoscopy: 5 yrs ago  Obstetric History OB History  Gravida Para Term Preterm AB Living  3 2     1 2   SAB TAB Ectopic Multiple Live Births  1            # Outcome Date GA Lbr Len/2nd Weight Sex Delivery Anes PTL Lv  3 SAB           2 Para           1 Para              ROS: A ROS was performed and pertinent positives and negatives are included in the history.  GENERAL: No fevers or chills. HEENT: No change in vision, no earache, sore throat or sinus congestion. NECK: No pain or stiffness. CARDIOVASCULAR: No chest pain or pressure. No palpitations. PULMONARY: No shortness of breath, cough or wheeze. GASTROINTESTINAL: No abdominal pain, nausea, vomiting or diarrhea, melena or bright red blood per rectum. GENITOURINARY: No urinary frequency, urgency, hesitancy or dysuria. MUSCULOSKELETAL: No joint or muscle pain, no back pain, no recent trauma. DERMATOLOGIC: No rash, no itching, no lesions. ENDOCRINE: No polyuria, polydipsia, no heat or cold intolerance. No recent change in weight. HEMATOLOGICAL: No anemia or easy bruising or bleeding. NEUROLOGIC: No  headache, seizures, numbness, tingling or weakness. PSYCHIATRIC: No depression, no loss of interest in normal activity or change in sleep pattern.     Exam:   BP 134/86   Ht 5\' 4"  (1.626 m)   Wt 254 lb (115.2 kg)   LMP 07/22/2015   BMI 43.60 kg/m   Body mass index is 43.6 kg/m.  General appearance : Well developed well nourished female. No acute distress HEENT: Eyes: no retinal hemorrhage or exudates,  Neck supple, trachea midline, no carotid bruits, no thyroidmegaly Lungs: Clear to auscultation, no rhonchi or wheezes, or rib retractions  Heart: Regular rate and rhythm, no murmurs or gallops Breast:Examined in sitting and supine position were symmetrical in appearance, no palpable masses or tenderness,  no skin retraction, no nipple inversion, no nipple discharge, no skin discoloration, no axillary or supraclavicular lymphadenopathy Abdomen: no palpable masses or tenderness, no rebound or guarding Extremities: no edema or skin discoloration or tenderness  Pelvic: Vulva: Normal             Vagina: No gross lesions or discharge.  Pap reflex done  Cervix/Uterus absent  Adnexa  Without masses or tenderness  Anus: Normal   Assessment/Plan:  52 y.o. female for annual exam   1. Encounter for Papanicolaou smear of vagina as part of routine gynecological examination Gynecologic exam status post total laparoscopic hysterectomy and left oophorectomy.  Pap reflex done on the vaginal vault.  Breast exam normal, but voluminous breasts.  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  Colonoscopy 5 years ago. - Pap IG w/ reflex to HPV when ASC-U  2. Perimenopause Probably in perimenopause based on increasing hot flashes and night sweats, but still having cyclic breast pains.  Will do an [place] to evaluate menopausal status. - FSH  3. H/O total hysterectomy  4. Breast pain in female Voluminous breasts with tenderness and pain.  Refer to Dr. Harlow Mares for bilateral breast reduction.  5. Class 3 severe  obesity due to excess calories without serious comorbidity with body mass index (BMI) of 40.0 to 44.9 in adult [facility]) Body mass index 43.6.  Recommend low calorie/low carb diet such as [employer].  Recommend aerobic physical activity 5 times a week and weightlifting every 2 days.  Counseling on above issues and coordination of care more than 50% for 10 minutes.  Princess Bruins MD, 12:05 PM 10/25/2017

## 2017-10-26 ENCOUNTER — Telehealth: Payer: Self-pay | Admitting: *Deleted

## 2017-10-26 LAB — FOLLICLE STIMULATING HORMONE: FSH: 62.8 m[IU]/mL

## 2017-10-26 NOTE — Telephone Encounter (Signed)
Patient scheduled on 11/07/17 @ 8:15am with Dr. Harlow Mares, notes faxed to office [phone]. Patient aware with time and date.

## 2017-10-26 NOTE — Telephone Encounter (Signed)
-----   Message from Princess Bruins, MD sent at 10/25/2017 12:23 PM EDT ----- Regarding: Refer to Dr Harlow Mares, Plastic Surgery Refer for bilateral breast reduction.  Voluminous painful bilateral breasts.  Screening Mammo negative 09/2017.

## 2017-10-29 LAB — PAP IG W/ RFLX HPV ASCU

## 2017-10-29 MED FILL — METOPROLOL SUCCINATE ER 100: 100 | 30 days supply | Qty: 30 | Fill #5

## 2017-11-09 MED FILL — AMLODIPINE BESYLATE 5 MG TA: 5 | 30 days supply | Qty: 30 | Fill #0

## 2017-11-12 MED FILL — ROSUVASTATIN CALCIUM 10 MG: 10 | 30 days supply | Qty: 30 | Fill #0

## 2017-11-13 ENCOUNTER — Ambulatory Visit (INDEPENDENT_AMBULATORY_CARE_PROVIDER_SITE_OTHER): Payer: 59 | Admitting: Psychology

## 2017-11-13 DIAGNOSIS — F331 Major depressive disorder, recurrent, moderate: Secondary | ICD-10-CM | POA: Diagnosis not present

## 2017-11-27 ENCOUNTER — Ambulatory Visit (INDEPENDENT_AMBULATORY_CARE_PROVIDER_SITE_OTHER): Payer: 59 | Admitting: Psychology

## 2017-11-27 DIAGNOSIS — F331 Major depressive disorder, recurrent, moderate: Secondary | ICD-10-CM | POA: Diagnosis not present

## 2017-12-03 MED FILL — AMLODIPINE BESYLATE 5 MG TA: 5 | 30 days supply | Qty: 30 | Fill #1

## 2017-12-05 MED FILL — METOPROLOL SUCCINATE ER 100: 100 | 30 days supply | Qty: 30 | Fill #0

## 2017-12-11 ENCOUNTER — Ambulatory Visit (INDEPENDENT_AMBULATORY_CARE_PROVIDER_SITE_OTHER): Payer: 59 | Admitting: Psychology

## 2017-12-11 DIAGNOSIS — F331 Major depressive disorder, recurrent, moderate: Secondary | ICD-10-CM

## 2017-12-17 MED FILL — ROSUVASTATIN CALCIUM 10 MG: 10 | 30 days supply | Qty: 30 | Fill #1

## 2017-12-25 ENCOUNTER — Ambulatory Visit (INDEPENDENT_AMBULATORY_CARE_PROVIDER_SITE_OTHER): Payer: 59 | Admitting: Psychology

## 2017-12-25 DIAGNOSIS — F331 Major depressive disorder, recurrent, moderate: Secondary | ICD-10-CM

## 2017-12-31 MED FILL — METOPROLOL SUCCINATE ER 100: 100 | 30 days supply | Qty: 30 | Fill #1

## 2018-01-07 MED FILL — AMLODIPINE BESYLATE 5 MG TA: 5 | 30 days supply | Qty: 30 | Fill #2

## 2018-01-08 ENCOUNTER — Ambulatory Visit (INDEPENDENT_AMBULATORY_CARE_PROVIDER_SITE_OTHER): Payer: 59 | Admitting: Psychology

## 2018-01-08 DIAGNOSIS — F331 Major depressive disorder, recurrent, moderate: Secondary | ICD-10-CM

## 2018-01-15 MED FILL — HYDROCHLOROTHIAZIDE 12.5 MG: 12.5 | 30 days supply | Qty: 30 | Fill #0

## 2018-01-22 ENCOUNTER — Ambulatory Visit (INDEPENDENT_AMBULATORY_CARE_PROVIDER_SITE_OTHER): Payer: 59 | Admitting: Psychology

## 2018-01-22 DIAGNOSIS — F331 Major depressive disorder, recurrent, moderate: Secondary | ICD-10-CM | POA: Diagnosis not present

## 2018-01-22 MED FILL — ROSUVASTATIN CALCIUM 10 MG: 10 | 30 days supply | Qty: 30 | Fill #2

## 2018-02-04 MED FILL — METOPROLOL SUCCINATE ER 100: 100 | 90 days supply | Qty: 90 | Fill #0

## 2018-02-04 MED FILL — AMLODIPINE BESYLATE 5 MG TA: 5 | 30 days supply | Qty: 30 | Fill #3

## 2018-02-05 ENCOUNTER — Ambulatory Visit: Payer: 59 | Admitting: Psychology

## 2018-02-15 MED FILL — AZITHROMYCIN 250 MG TABLET: 250 | 5 days supply | Qty: 6 | Fill #0

## 2018-02-21 MED FILL — ROSUVASTATIN CALCIUM 10 MG: 10 | 30 days supply | Qty: 30 | Fill #3

## 2018-02-21 MED FILL — HYDROCHLOROTHIAZIDE 12.5 MG: 12.5 | 30 days supply | Qty: 30 | Fill #1

## 2018-03-05 ENCOUNTER — Ambulatory Visit (INDEPENDENT_AMBULATORY_CARE_PROVIDER_SITE_OTHER): Payer: 59 | Admitting: Psychology

## 2018-03-05 DIAGNOSIS — F331 Major depressive disorder, recurrent, moderate: Secondary | ICD-10-CM | POA: Diagnosis not present

## 2018-03-14 MED FILL — AMLODIPINE BESYLATE 5 MG TA: 5 | 30 days supply | Qty: 30 | Fill #4

## 2018-03-19 ENCOUNTER — Ambulatory Visit (INDEPENDENT_AMBULATORY_CARE_PROVIDER_SITE_OTHER): Payer: 59 | Admitting: Psychology

## 2018-03-19 DIAGNOSIS — F331 Major depressive disorder, recurrent, moderate: Secondary | ICD-10-CM | POA: Diagnosis not present

## 2018-03-25 MED FILL — NORETHINDRONE 0.35 MG TAB: 0.35 | 84 days supply | Qty: 84 | Fill #1

## 2018-03-25 MED FILL — ROSUVASTATIN CALCIUM 10 MG: 10 | 30 days supply | Qty: 30 | Fill #4

## 2018-03-25 MED FILL — HYDROCHLOROTHIAZIDE 12.5 MG: 12.5 | 30 days supply | Qty: 30 | Fill #2

## 2018-04-02 ENCOUNTER — Ambulatory Visit: Payer: 59 | Admitting: Psychology

## 2018-04-02 DIAGNOSIS — F331 Major depressive disorder, recurrent, moderate: Secondary | ICD-10-CM

## 2018-04-04 ENCOUNTER — Ambulatory Visit (INDEPENDENT_AMBULATORY_CARE_PROVIDER_SITE_OTHER): Payer: 59 | Admitting: Pulmonary Disease

## 2018-04-04 ENCOUNTER — Encounter: Payer: Self-pay | Admitting: Pulmonary Disease

## 2018-04-04 VITALS — BP 122/72 | HR 64 | Ht 64.0 in | Wt 230.0 lb

## 2018-04-04 DIAGNOSIS — G4733 Obstructive sleep apnea (adult) (pediatric): Secondary | ICD-10-CM | POA: Diagnosis not present

## 2018-04-04 NOTE — Assessment & Plan Note (Signed)
Her machine does seem to be working.  She does seem to have supplies at home including new tubing and perhaps new pillows to.  She will get back on her CPAP machine I believe this is set on auto settings. We will confirm download in 2 weeks that she is compliant and send a clearance letter to her plastic surgeon Dr. Harlow Mares. I discussed surgical complications of OSA and explained to her while compliance before and even after the postoperative.  Is important especially if she is going to be on narcotics postop.  Weight loss encouraged, compliance with goal of at least 4-6 hrs every night is the expectation. Advised against medications with sedative side effects Cautioned against driving when sleepy - understanding that sleepiness will vary on a day to day basis
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
## 2018-04-04 NOTE — Patient Instructions (Signed)
  Get back on your auto CPAP machine Call us if you need a new prescription for nasal pillows. We will obtain a report in 2 weeks and send in clearance letter to Dr. Harlow Mares

## 2018-04-15 MED FILL — AMLODIPINE BESYLATE 5 MG TA: 5 | 30 days supply | Qty: 30 | Fill #5

## 2018-04-16 ENCOUNTER — Ambulatory Visit (INDEPENDENT_AMBULATORY_CARE_PROVIDER_SITE_OTHER): Payer: 59 | Admitting: Psychology

## 2018-04-16 DIAGNOSIS — F331 Major depressive disorder, recurrent, moderate: Secondary | ICD-10-CM

## 2018-04-22 MED FILL — HYDROCHLOROTHIAZIDE 12.5 MG: 12.5 | 30 days supply | Qty: 30 | Fill #3

## 2018-04-22 MED FILL — ROSUVASTATIN CALCIUM 10 MG: 10 | 30 days supply | Qty: 30 | Fill #5

## 2018-04-30 ENCOUNTER — Ambulatory Visit: Payer: 59 | Admitting: Psychology

## 2018-04-30 DIAGNOSIS — F331 Major depressive disorder, recurrent, moderate: Secondary | ICD-10-CM

## 2018-05-01 MED FILL — METOPROLOL SUCCINATE ER 100: 100 | 90 days supply | Qty: 90 | Fill #0

## 2018-05-08 MED FILL — HYDROCHLOROTHIAZIDE 12.5 MG: 12.5 | 30 days supply | Qty: 30 | Fill #4

## 2018-05-08 MED FILL — ROSUVASTATIN CALCIUM 10 MG: 10 | 30 days supply | Qty: 30 | Fill #0

## 2018-05-08 MED FILL — AMLODIPINE BESYLATE 5 MG TA: 5 | 30 days supply | Qty: 30 | Fill #0

## 2018-05-14 ENCOUNTER — Ambulatory Visit: Payer: 59 | Admitting: Psychology

## 2018-05-28 ENCOUNTER — Ambulatory Visit: Payer: 59 | Admitting: Psychology

## 2018-06-11 ENCOUNTER — Ambulatory Visit: Payer: 59 | Admitting: Psychology

## 2018-06-11 MED FILL — AMLODIPINE BESYLATE 5 MG TA: 5 | 30 days supply | Qty: 30 | Fill #1

## 2018-06-17 MED FILL — ROSUVASTATIN CALCIUM 10 MG: 10 | 90 days supply | Qty: 90 | Fill #0

## 2018-06-17 MED FILL — METOPROLOL SUCCINATE ER 100: 100 | 90 days supply | Qty: 90 | Fill #0

## 2018-06-17 MED FILL — HYDROCHLOROTHIAZIDE 12.5 MG: 12.5 | 90 days supply | Qty: 90 | Fill #0

## 2018-06-25 ENCOUNTER — Ambulatory Visit (INDEPENDENT_AMBULATORY_CARE_PROVIDER_SITE_OTHER): Payer: 59 | Admitting: Psychology

## 2018-06-25 DIAGNOSIS — F331 Major depressive disorder, recurrent, moderate: Secondary | ICD-10-CM

## 2018-06-25 MED FILL — HYDROCHLOROTHIAZIDE 12.5 MG: 12.5 | 90 days supply | Qty: 90 | Fill #0

## 2018-06-25 MED FILL — ROSUVASTATIN CALCIUM 10 MG: 10 | 90 days supply | Qty: 90 | Fill #0

## 2018-06-25 MED FILL — METOPROLOL SUCCINATE ER 100: 100 | 90 days supply | Qty: 90 | Fill #0

## 2018-07-09 ENCOUNTER — Ambulatory Visit: Payer: 59 | Admitting: Psychology

## 2018-07-11 ENCOUNTER — Ambulatory Visit (INDEPENDENT_AMBULATORY_CARE_PROVIDER_SITE_OTHER): Payer: 59 | Admitting: Psychology

## 2018-07-11 DIAGNOSIS — F331 Major depressive disorder, recurrent, moderate: Secondary | ICD-10-CM

## 2018-07-15 MED FILL — AMLODIPINE BESYLATE 5 MG TA: 5 | 30 days supply | Qty: 30 | Fill #2

## 2018-07-23 ENCOUNTER — Ambulatory Visit (INDEPENDENT_AMBULATORY_CARE_PROVIDER_SITE_OTHER): Payer: 59 | Admitting: Psychology

## 2018-07-23 DIAGNOSIS — F331 Major depressive disorder, recurrent, moderate: Secondary | ICD-10-CM

## 2018-08-06 ENCOUNTER — Ambulatory Visit (INDEPENDENT_AMBULATORY_CARE_PROVIDER_SITE_OTHER): Payer: 59 | Admitting: Psychology

## 2018-08-06 DIAGNOSIS — F331 Major depressive disorder, recurrent, moderate: Secondary | ICD-10-CM | POA: Diagnosis not present

## 2018-08-13 MED FILL — AMLODIPINE BESYLATE 5 MG TA: 5 | 90 days supply | Qty: 90 | Fill #0

## 2018-08-20 ENCOUNTER — Ambulatory Visit (INDEPENDENT_AMBULATORY_CARE_PROVIDER_SITE_OTHER): Payer: 59 | Admitting: Psychology

## 2018-08-20 DIAGNOSIS — F331 Major depressive disorder, recurrent, moderate: Secondary | ICD-10-CM

## 2018-09-02 ENCOUNTER — Other Ambulatory Visit (HOSPITAL_BASED_OUTPATIENT_CLINIC_OR_DEPARTMENT_OTHER): Payer: Self-pay | Admitting: Obstetrics & Gynecology

## 2018-09-02 DIAGNOSIS — Z1231 Encounter for screening mammogram for malignant neoplasm of breast: Secondary | ICD-10-CM

## 2018-09-03 ENCOUNTER — Ambulatory Visit (INDEPENDENT_AMBULATORY_CARE_PROVIDER_SITE_OTHER): Payer: 59 | Admitting: Psychology

## 2018-09-03 DIAGNOSIS — F331 Major depressive disorder, recurrent, moderate: Secondary | ICD-10-CM

## 2018-09-17 ENCOUNTER — Ambulatory Visit (INDEPENDENT_AMBULATORY_CARE_PROVIDER_SITE_OTHER): Payer: 59 | Admitting: Psychology

## 2018-09-17 ENCOUNTER — Ambulatory Visit: Payer: 59 | Admitting: Psychology

## 2018-09-17 DIAGNOSIS — F331 Major depressive disorder, recurrent, moderate: Secondary | ICD-10-CM | POA: Diagnosis not present

## 2018-09-20 MED FILL — HYDROCHLOROTHIAZIDE 12.5 MG: 12.5 | 90 days supply | Qty: 90 | Fill #1

## 2018-09-20 MED FILL — ROSUVASTATIN CALCIUM 10 MG: 10 | 90 days supply | Qty: 90 | Fill #1

## 2018-09-23 ENCOUNTER — Encounter (HOSPITAL_BASED_OUTPATIENT_CLINIC_OR_DEPARTMENT_OTHER): Payer: Self-pay

## 2018-09-23 ENCOUNTER — Other Ambulatory Visit: Payer: Self-pay

## 2018-09-23 ENCOUNTER — Ambulatory Visit (HOSPITAL_BASED_OUTPATIENT_CLINIC_OR_DEPARTMENT_OTHER)
Admission: RE | Admit: 2018-09-23 | Discharge: 2018-09-23 | Disposition: A | Discharge status: Home / Self Care | Payer: 59 | Source: Ambulatory Visit | Attending: Obstetrics & Gynecology | Admitting: Obstetrics & Gynecology

## 2018-09-23 DIAGNOSIS — Z1231 Encounter for screening mammogram for malignant neoplasm of breast: Secondary | ICD-10-CM | POA: Insufficient documentation

## 2018-09-24 ENCOUNTER — Other Ambulatory Visit: Payer: Self-pay | Admitting: Obstetrics & Gynecology

## 2018-09-24 DIAGNOSIS — R928 Other abnormal and inconclusive findings on diagnostic imaging of breast: Secondary | ICD-10-CM

## 2018-09-25 ENCOUNTER — Ambulatory Visit
Admission: RE | Admit: 2018-09-25 | Discharge: 2018-09-25 | Disposition: A | Discharge status: Home / Self Care | Payer: 59 | Source: Ambulatory Visit | Attending: Obstetrics & Gynecology | Admitting: Obstetrics & Gynecology

## 2018-09-25 ENCOUNTER — Other Ambulatory Visit: Payer: Self-pay

## 2018-09-25 ENCOUNTER — Other Ambulatory Visit: Payer: Self-pay | Admitting: Obstetrics & Gynecology

## 2018-09-25 DIAGNOSIS — R928 Other abnormal and inconclusive findings on diagnostic imaging of breast: Secondary | ICD-10-CM

## 2018-09-25 DIAGNOSIS — N6320 Unspecified lump in the left breast, unspecified quadrant: Secondary | ICD-10-CM

## 2018-10-01 ENCOUNTER — Ambulatory Visit (INDEPENDENT_AMBULATORY_CARE_PROVIDER_SITE_OTHER): Payer: 59 | Admitting: Psychology

## 2018-10-01 ENCOUNTER — Ambulatory Visit: Payer: 59 | Admitting: Psychology

## 2018-10-01 DIAGNOSIS — F331 Major depressive disorder, recurrent, moderate: Secondary | ICD-10-CM

## 2018-10-15 ENCOUNTER — Ambulatory Visit (INDEPENDENT_AMBULATORY_CARE_PROVIDER_SITE_OTHER): Payer: 59 | Admitting: Psychology

## 2018-10-15 DIAGNOSIS — F331 Major depressive disorder, recurrent, moderate: Secondary | ICD-10-CM | POA: Diagnosis not present

## 2018-10-23 MED FILL — PEG-3350 SOLUTION: 420 | 1 days supply | Qty: 4000 | Fill #0

## 2018-10-25 MED FILL — METOPROLOL SUCCINATE ER 100: 100 | 90 days supply | Qty: 90 | Fill #1

## 2018-10-29 ENCOUNTER — Ambulatory Visit (INDEPENDENT_AMBULATORY_CARE_PROVIDER_SITE_OTHER): Payer: 59 | Admitting: Psychology

## 2018-10-29 ENCOUNTER — Encounter: Payer: 59 | Admitting: Obstetrics & Gynecology

## 2018-10-29 DIAGNOSIS — F331 Major depressive disorder, recurrent, moderate: Secondary | ICD-10-CM

## 2018-11-11 MED FILL — AMLODIPINE BESYLATE 5 MG TA: 5 | 90 days supply | Qty: 90 | Fill #1

## 2018-11-12 ENCOUNTER — Ambulatory Visit (INDEPENDENT_AMBULATORY_CARE_PROVIDER_SITE_OTHER): Payer: 59 | Admitting: Psychology

## 2018-11-12 DIAGNOSIS — F331 Major depressive disorder, recurrent, moderate: Secondary | ICD-10-CM | POA: Diagnosis not present

## 2018-11-14 ENCOUNTER — Other Ambulatory Visit: Payer: Self-pay

## 2018-11-15 ENCOUNTER — Encounter: Payer: 59 | Admitting: Obstetrics & Gynecology

## 2018-11-15 ENCOUNTER — Encounter: Payer: Self-pay | Admitting: Obstetrics & Gynecology

## 2018-11-15 ENCOUNTER — Ambulatory Visit (INDEPENDENT_AMBULATORY_CARE_PROVIDER_SITE_OTHER): Payer: 59 | Admitting: Obstetrics & Gynecology

## 2018-11-15 VITALS — BP 126/80 | Ht 64.0 in | Wt 241.0 lb

## 2018-11-15 DIAGNOSIS — Z780 Asymptomatic menopausal state: Secondary | ICD-10-CM

## 2018-11-15 DIAGNOSIS — Z01419 Encounter for gynecological examination (general) (routine) without abnormal findings: Secondary | ICD-10-CM | POA: Diagnosis not present

## 2018-11-15 DIAGNOSIS — Z90710 Acquired absence of both cervix and uterus: Secondary | ICD-10-CM

## 2018-11-15 DIAGNOSIS — Z6841 Body Mass Index (BMI) 40.0 and over, adult: Secondary | ICD-10-CM

## 2018-11-15 DIAGNOSIS — N9089 Other specified noninflammatory disorders of vulva and perineum: Secondary | ICD-10-CM | POA: Diagnosis not present

## 2018-11-15 NOTE — Patient Instructions (Signed)
1. Well female exam with routine gynecological exam Gynecologic exam status post total hysterectomy and menopause.  Pap test September 2019 was negative, no indication to repeat this year.  Breast exam normal.  Mammogram in August 2020, right negative, left diagnostic mammogram and ultrasound probably benign.  We will repeat a left mammography in 6 months.  Planning Bilateral Breast reduction.  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  2. H/O total hysterectomy  3. Postmenopause Well on no hormone replacement therapy.  Recommend vitamin D supplements, calcium intake of 1200 mg daily and regular weightbearing physical activities.  4. Vulvar lesion Left post vulvar nodule with fissure.  Hydrocortisone 1% cream.  F/U Colpo/Excision of lesion in 4 weeks.  5. Class 3 severe obesity due to excess calories without serious comorbidity with body mass index (BMI) of 40.0 to 44.9 in adult (HCC) Lost 13 Lbs x last year.  Continue with low calorie/carb diet.  Aerobic physical activities 5 times a week and weightlifting every 2 days.  Other orders - amLODipine (NORVASC) 5 MG tablet; Take 1 tablet by mouth daily. - rosuvastatin (CRESTOR) 10 MG tablet; Take 1 tablet by mouth daily.  Janet [name], it was a pleasure seeing you today!

## 2018-11-15 NOTE — Progress Notes (Signed)
[place] May 05, 1965 [id number]   History:    53 y.o. G3P2A1L2 Divorced.  2 daughters doing well.  RP:  Established patient presenting for annual gyn exam   HPI: S/P Total Hysterectomy.  No current pelvic pain.  Abstinent.  Urine and bowel movements normal.  Breasts normal.  Planning a bilateral breast reduction.  Body mass index 41.37.  Lost 30 pounds since last year.  Working on a lower [employer].  Increasing physical activities.  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  Past medical history,surgical history, family history and social history were all reviewed and documented in the EPIC chart.  Gynecologic History Patient's last menstrual period was 07/22/2015. Contraception: status post hysterectomy Last Pap: 10/2017. Results were: Negative Last mammogram: 09/2018. Results were: Rt Negative, Lt Dx mammo/US Probably benign, repeat in 6 mths Bone Density: Never Colonoscopy: Scheduled in 3 weeks.  Obstetric History OB History  Gravida Para Term Preterm AB Living  3 2     1 2   SAB TAB Ectopic Multiple Live Births  1            # Outcome Date GA Lbr Len/2nd Weight Sex Delivery Anes PTL Lv  3 SAB           2 Para           1 Para              ROS: A ROS was performed and pertinent positives and negatives are included in the history.  GENERAL: No fevers or chills. HEENT: No change in vision, no earache, sore throat or sinus congestion. NECK: No pain or stiffness. CARDIOVASCULAR: No chest pain or pressure. No palpitations. PULMONARY: No shortness of breath, cough or wheeze. GASTROINTESTINAL: No abdominal pain, nausea, vomiting or diarrhea, melena or bright red blood per rectum. GENITOURINARY: No urinary frequency, urgency, hesitancy or dysuria. MUSCULOSKELETAL: No joint or muscle pain, no back pain, no recent trauma. DERMATOLOGIC: No rash, no itching, no lesions. ENDOCRINE: No polyuria, polydipsia, no heat or cold intolerance. No recent change in weight. HEMATOLOGICAL: No  anemia or easy bruising or bleeding. NEUROLOGIC: No headache, seizures, numbness, tingling or weakness. PSYCHIATRIC: No depression, no loss of interest in normal activity or change in sleep pattern.     Exam:   BP 126/80 (BP Location: Right Arm, Patient Position: Sitting, Cuff Size: Large)   Ht 5\' 4"  (1.626 m)   Wt 241 lb (109.3 kg)   LMP 07/22/2015   BMI 41.37 kg/m   Body mass index is 41.37 kg/m.  General appearance : Well developed well nourished female. No acute distress HEENT: Eyes: no retinal hemorrhage or exudates,  Neck supple, trachea midline, no carotid bruits, no thyroidmegaly Lungs: Clear to auscultation, no rhonchi or wheezes, or rib retractions  Heart: Regular rate and rhythm, no murmurs or gallops Breast:Examined in sitting and supine position were symmetrical in appearance, no palpable masses or tenderness,  no skin retraction, no nipple inversion, no nipple discharge, no skin discoloration, no axillary or supraclavicular lymphadenopathy Abdomen: no palpable masses or tenderness, no rebound or guarding Extremities: no edema or skin discoloration or tenderness  Pelvic: Vulva: Left posterior vulva with a 5 mm nodule superficially at the skin and a small fissure beside it.             Vagina: No gross lesions or discharge  Cervix/Uterus absent  Adnexa  Without masses or tenderness  Anus: Normal   Assessment/Plan:  53 y.o. female for annual exam  1. Well female exam with routine gynecological exam Gynecologic exam status post total hysterectomy and menopause.  Pap test September 2019 was negative, no indication to repeat this year.  Breast exam normal.  Mammogram in August 2020, right negative, left diagnostic mammogram and ultrasound probably benign.  We will repeat a left mammography in 6 months.  Planning Bilateral Breast reduction.  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  2. H/O total hysterectomy  3. Postmenopause Well on no hormone replacement therapy.  Recommend  vitamin D supplements, calcium intake of 1200 mg daily and regular weightbearing physical activities.  4. Vulvar lesion Left post vulvar nodule with fissure.  Hydrocortisone 1% cream.  F/U Colpo/Excision of lesion in 4 weeks.  5. Class 3 severe obesity due to excess calories without serious comorbidity with body mass index (BMI) of 40.0 to 44.9 in adult (HCC) Lost 13 Lbs x last year.  Continue with low calorie/carb diet.  Aerobic physical activities 5 times a week and weightlifting every 2 days.  Other orders - amLODipine (NORVASC) 5 MG tablet; Take 1 tablet by mouth daily. - rosuvastatin (CRESTOR) 10 MG tablet; Take 1 tablet by mouth daily.  Princess Bruins MD, 2:09 PM 11/15/2018

## 2018-11-26 ENCOUNTER — Ambulatory Visit (INDEPENDENT_AMBULATORY_CARE_PROVIDER_SITE_OTHER): Payer: 59 | Admitting: Psychology

## 2018-11-26 DIAGNOSIS — F331 Major depressive disorder, recurrent, moderate: Secondary | ICD-10-CM | POA: Diagnosis not present

## 2018-12-10 ENCOUNTER — Ambulatory Visit (INDEPENDENT_AMBULATORY_CARE_PROVIDER_SITE_OTHER): Payer: 59 | Admitting: Psychology

## 2018-12-10 DIAGNOSIS — F331 Major depressive disorder, recurrent, moderate: Secondary | ICD-10-CM | POA: Diagnosis not present

## 2018-12-19 ENCOUNTER — Other Ambulatory Visit: Payer: Self-pay

## 2018-12-20 ENCOUNTER — Encounter: Payer: Self-pay | Admitting: Obstetrics & Gynecology

## 2018-12-20 ENCOUNTER — Ambulatory Visit: Payer: 59 | Admitting: Obstetrics & Gynecology

## 2018-12-20 VITALS — BP 118/80

## 2018-12-20 DIAGNOSIS — N907 Vulvar cyst: Secondary | ICD-10-CM

## 2018-12-20 DIAGNOSIS — N9089 Other specified noninflammatory disorders of vulva and perineum: Secondary | ICD-10-CM

## 2018-12-20 MED FILL — HYDROCHLOROTHIAZIDE 12.5 MG: 12.5 | 90 days supply | Qty: 90 | Fill #2

## 2018-12-20 MED FILL — ROSUVASTATIN CALCIUM 10 MG: 10 | 90 days supply | Qty: 90 | Fill #2

## 2018-12-20 NOTE — Progress Notes (Signed)
    Janet [name] November 12, 1965 [id number]        53 y.o.  [id number] Divorced.  RP: Persistent left lower vulvar lesion for Vulvar Colposcopy  HPI: Left lower vulvar lesion decreased in size, but still present.  Not painful.   OB History  Gravida Para Term Preterm AB Living  3 2     1 2   SAB TAB Ectopic Multiple Live Births  1            # Outcome Date GA Lbr Len/2nd Weight Sex Delivery Anes PTL Lv  3 SAB           2 Para           1 Para             Past medical history,surgical history, problem list, medications, allergies, family history and social history were all reviewed and documented in the EPIC chart.   Directed ROS with pertinent positives and negatives documented in the history of present illness/assessment and plan.  Exam:  Vitals:   12/20/18 1124  BP: 118/80   General appearance:  Normal  Colposcopy Procedure Note Janet [name] 12/20/2018  Indications: Left lower vulvar lesion  Procedure Details  The risks and benefits of the procedure and Verbal informed consent obtained.  The vulva was swabbed x 3 with acetic acid solution.  Findings:  Vulvar colposcopy:  Small sebaceous gland cyst at left lower vulva.  No condyloma like lesion.  No evidence of dysplastic areas.  Perirectal colposcopy: Normal  The cervix was sprayed with [place] before performing the cervical biopsies.  Specimens: None  Complications: None . Plan:  Observation/reassurance   Assessment/Plan:  53 y.o. [id number]   1. Vulvar lesion Small left lower Sebasto this cyst currently resolving.  Vulvar colposcopy negative otherwise.  Findings reviewed with patient.  Patient reassured.  Follow-up with annual gynecologic exam when due.  Princess Bruins MD, 12:06 PM 12/20/2018

## 2018-12-23 ENCOUNTER — Encounter: Payer: Self-pay | Admitting: Obstetrics & Gynecology

## 2018-12-23 NOTE — Patient Instructions (Signed)
1. Vulvar lesion Small left lower Sebasto this cyst currently resolving.  Vulvar colposcopy negative otherwise.  Findings reviewed with patient.  Patient reassured.  Janet [name], it was a pleasure seeing you today!

## 2018-12-24 ENCOUNTER — Ambulatory Visit (INDEPENDENT_AMBULATORY_CARE_PROVIDER_SITE_OTHER): Payer: 59 | Admitting: Psychology

## 2018-12-24 DIAGNOSIS — F331 Major depressive disorder, recurrent, moderate: Secondary | ICD-10-CM

## 2019-01-07 ENCOUNTER — Ambulatory Visit (INDEPENDENT_AMBULATORY_CARE_PROVIDER_SITE_OTHER): Payer: 59 | Admitting: Psychology

## 2019-01-07 DIAGNOSIS — F331 Major depressive disorder, recurrent, moderate: Secondary | ICD-10-CM | POA: Diagnosis not present

## 2019-01-21 ENCOUNTER — Ambulatory Visit (INDEPENDENT_AMBULATORY_CARE_PROVIDER_SITE_OTHER): Payer: 59 | Admitting: Psychology

## 2019-01-21 DIAGNOSIS — F331 Major depressive disorder, recurrent, moderate: Secondary | ICD-10-CM | POA: Diagnosis not present

## 2019-02-04 ENCOUNTER — Ambulatory Visit (INDEPENDENT_AMBULATORY_CARE_PROVIDER_SITE_OTHER): Payer: 59 | Admitting: Psychology

## 2019-02-04 DIAGNOSIS — F331 Major depressive disorder, recurrent, moderate: Secondary | ICD-10-CM

## 2019-02-04 MED FILL — AMLODIPINE BESYLATE 5 MG TA: 5 | 90 days supply | Qty: 90 | Fill #2

## 2019-02-04 MED FILL — METOPROLOL SUCCINATE ER 100: 100 | 90 days supply | Qty: 90 | Fill #2

## 2019-02-18 ENCOUNTER — Ambulatory Visit: Payer: 59 | Admitting: Psychology

## 2019-03-04 ENCOUNTER — Ambulatory Visit (INDEPENDENT_AMBULATORY_CARE_PROVIDER_SITE_OTHER): Payer: 59 | Admitting: Psychology

## 2019-03-04 DIAGNOSIS — F331 Major depressive disorder, recurrent, moderate: Secondary | ICD-10-CM

## 2019-03-13 ENCOUNTER — Other Ambulatory Visit: Payer: Self-pay

## 2019-03-13 ENCOUNTER — Ambulatory Visit (INDEPENDENT_AMBULATORY_CARE_PROVIDER_SITE_OTHER): Payer: 59

## 2019-03-13 ENCOUNTER — Ambulatory Visit: Payer: 59 | Admitting: Podiatry

## 2019-03-13 ENCOUNTER — Encounter: Payer: Self-pay | Admitting: Podiatry

## 2019-03-13 VITALS — Temp 97.2°F

## 2019-03-13 DIAGNOSIS — M79672 Pain in left foot: Secondary | ICD-10-CM

## 2019-03-13 DIAGNOSIS — M722 Plantar fascial fibromatosis: Secondary | ICD-10-CM

## 2019-03-13 DIAGNOSIS — L600 Ingrowing nail: Secondary | ICD-10-CM

## 2019-03-13 DIAGNOSIS — M779 Enthesopathy, unspecified: Secondary | ICD-10-CM

## 2019-03-13 DIAGNOSIS — M79671 Pain in right foot: Secondary | ICD-10-CM

## 2019-03-13 NOTE — Progress Notes (Signed)
   Subjective:    Patient ID: Janet [name], female    DOB: 1965-03-26, 54 y.o.   MRN: [id number]  HPI    Review of Systems  All other systems reviewed and are negative.      Objective:   Physical Exam        Assessment & Plan:

## 2019-03-14 ENCOUNTER — Other Ambulatory Visit: Payer: Self-pay | Admitting: Podiatry

## 2019-03-14 DIAGNOSIS — M722 Plantar fascial fibromatosis: Secondary | ICD-10-CM

## 2019-03-14 DIAGNOSIS — M779 Enthesopathy, unspecified: Secondary | ICD-10-CM

## 2019-03-14 MED FILL — HYDROCHLOROTHIAZIDE 12.5 MG: 12.5 | 90 days supply | Qty: 90 | Fill #3

## 2019-03-14 MED FILL — ROSUVASTATIN CALCIUM 10 MG: 10 | 90 days supply | Qty: 90 | Fill #3

## 2019-03-18 ENCOUNTER — Ambulatory Visit: Payer: 59 | Admitting: Psychology

## 2019-03-18 NOTE — Progress Notes (Signed)
Subjective:   Patient ID: Janet [name], female   DOB: 54 y.o.   MRN: [id number]   HPI Patient presents stating she has had orthotics in the past she gets chronic foot pain in both feet and states the joints can bother her along with nodules on bilateral feet.  Patient tries to be active and has trouble because of her foot structure and works on cement type floors   Review of Systems  All other systems reviewed and are negative.       Objective:  Physical Exam Vitals and nursing note reviewed.  Constitutional:      Appearance: She is well-developed.  Pulmonary:     Effort: Pulmonary effort is normal.  Musculoskeletal:        General: Normal range of motion.  Skin:    General: Skin is warm.  Neurological:     Mental Status: She is alert.     Neurovascular status intact muscle strength found to be adequate range of motion within normal limits.  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

## 2019-04-01 ENCOUNTER — Ambulatory Visit (INDEPENDENT_AMBULATORY_CARE_PROVIDER_SITE_OTHER): Payer: 59 | Admitting: Psychology

## 2019-04-01 DIAGNOSIS — F331 Major depressive disorder, recurrent, moderate: Secondary | ICD-10-CM

## 2019-04-03 ENCOUNTER — Other Ambulatory Visit: Payer: Self-pay | Admitting: Obstetrics & Gynecology

## 2019-04-03 ENCOUNTER — Ambulatory Visit
Admission: RE | Admit: 2019-04-03 | Discharge: 2019-04-03 | Disposition: A | Discharge status: Home / Self Care | Payer: 59 | Source: Ambulatory Visit | Attending: Obstetrics & Gynecology | Admitting: Obstetrics & Gynecology

## 2019-04-03 ENCOUNTER — Other Ambulatory Visit: Payer: Self-pay

## 2019-04-03 DIAGNOSIS — N6320 Unspecified lump in the left breast, unspecified quadrant: Secondary | ICD-10-CM

## 2019-04-04 ENCOUNTER — Ambulatory Visit
Admission: RE | Admit: 2019-04-04 | Discharge: 2019-04-04 | Disposition: A | Discharge status: Home / Self Care | Payer: 59 | Source: Ambulatory Visit | Attending: Obstetrics & Gynecology | Admitting: Obstetrics & Gynecology

## 2019-04-04 DIAGNOSIS — N6320 Unspecified lump in the left breast, unspecified quadrant: Secondary | ICD-10-CM

## 2019-04-07 ENCOUNTER — Other Ambulatory Visit: Payer: Self-pay

## 2019-04-07 ENCOUNTER — Encounter: Payer: 59 | Admitting: Orthotics

## 2019-04-15 ENCOUNTER — Ambulatory Visit: Payer: 59 | Admitting: Psychology

## 2019-04-17 ENCOUNTER — Encounter: Payer: Self-pay | Admitting: Podiatry

## 2019-04-17 ENCOUNTER — Other Ambulatory Visit: Payer: Self-pay

## 2019-04-17 ENCOUNTER — Ambulatory Visit: Payer: 59 | Admitting: Podiatry

## 2019-04-17 DIAGNOSIS — L600 Ingrowing nail: Secondary | ICD-10-CM

## 2019-04-17 DIAGNOSIS — M722 Plantar fascial fibromatosis: Secondary | ICD-10-CM

## 2019-04-17 MED ORDER — NEOMYCIN-POLYMYXIN-HC 3.5-10000-1 OT SOLN: OTIC | 0 refills | Status: DC

## 2019-04-17 NOTE — Patient Instructions (Signed)

## 2019-04-17 NOTE — Progress Notes (Signed)
Subjective:   Patient ID: Janet [name], female   DOB: 54 y.o.   MRN: [id number]   HPI Patient states I still get some pain in my feet but I am hoping the orthotics are helping and I have these chronic ingrown toenails of both feet which I want fixed as they are sore and I cannot trim them myself   ROS      Objective:  Physical Exam  Neurovascular status intact with patient found to have incurvated hallux nail beds bilateral medial lateral that are painful when pressed and make shoe gear difficult.  Patient is chronic foot pain utilizing orthotic for plantar fibroma formation     Assessment:  Ingrown toenail deformity hallux bilateral with other foot pain hopefully orthotics address     Plan:  H&P reviewed ingrown toenails and recommended correction.  I explained procedure risk and patient wants surgery understanding risk and signed consent form.  I infiltrated each hallux 60 mg like Marcaine mixture sterile prep applied to each big toe and using sterile instrumentation I remove the medial lateral borders exposed the matrix applied phenol 3 applications 30 seconds followed by alcohol lavage and sterile dressing and instructed on soaks and leave the dressings on 24 hours but taking them off earlier if needed and also I wrote prescription for drops and encouraged her to call with any questions concerns.  We will check back on her orthotics in the next couple months and she is encouraged to call us as needed

## 2019-04-29 ENCOUNTER — Ambulatory Visit (INDEPENDENT_AMBULATORY_CARE_PROVIDER_SITE_OTHER): Payer: 59 | Admitting: Psychology

## 2019-04-29 DIAGNOSIS — F331 Major depressive disorder, recurrent, moderate: Secondary | ICD-10-CM

## 2019-05-08 ENCOUNTER — Telehealth: Payer: Self-pay | Admitting: Pulmonary Disease
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
## 2019-05-08 NOTE — Telephone Encounter (Signed)
Settings need to be changed.  Patient phone number is [phone].

## 2019-05-09 ENCOUNTER — Encounter: Payer: Self-pay | Admitting: Primary Care

## 2019-05-09 ENCOUNTER — Other Ambulatory Visit: Payer: Self-pay

## 2019-05-09 ENCOUNTER — Ambulatory Visit (INDEPENDENT_AMBULATORY_CARE_PROVIDER_SITE_OTHER): Payer: 59 | Admitting: Primary Care

## 2019-05-09 VITALS — BP 112/78 | HR 64 | Temp 98.0°F | Ht 64.0 in | Wt 243.0 lb

## 2019-05-09 DIAGNOSIS — G4733 Obstructive sleep apnea (adult) (pediatric): Secondary | ICD-10-CM

## 2019-05-09 MED FILL — METOPROLOL SUCCINATE ER 100: 100 | 90 days supply | Qty: 90 | Fill #3

## 2019-05-09 MED FILL — AMLODIPINE BESYLATE 5 MG TA: 5 | 90 days supply | Qty: 90 | Fill #3

## 2019-05-09 NOTE — Telephone Encounter (Signed)
Last seen 03/2018. Needs face-to-face visit with APP please

## 2019-05-09 NOTE — Telephone Encounter (Signed)
Pt seeing beth today at 4:00 to discuss further. Nothing further needed at this time- will close encounter.
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
## 2019-05-09 NOTE — Patient Instructions (Signed)
Referral: DME for new CPAP machine, supplies, mask of choice, humidification (pressure setting 9cm h2o) May need to repeat home sleep study   Recommendations: Aim to wear CPAP every night for minimum 4 hours+ Do not drive if experiencing excessive daytime fatigue or somnolence  Follow-up: 3 months with Dr. Elsworth Soho or APP   Sleep Apnea Sleep apnea affects breathing during sleep. It causes breathing to stop for a short time or to become shallow. It can also increase the risk of:  Heart attack.  Stroke.  Being very overweight (obese).  Diabetes.  Heart failure.  Irregular heartbeat. The goal of treatment is to help you breathe normally again. What are the causes? There are three kinds of sleep apnea:  Obstructive sleep apnea. This is caused by a blocked or collapsed airway.  Central sleep apnea. This happens when the brain does not send the right signals to the muscles that control breathing.  Mixed sleep apnea. This is a combination of obstructive and central sleep apnea. The most common cause of this condition is a collapsed or blocked airway. This can happen if:  Your throat muscles are too relaxed.  Your tongue and tonsils are too large.  You are overweight.  Your airway is too small. What increases the risk?  Being overweight.  Smoking.  Having a small airway.  Being older.  Being female.  Drinking alcohol.  Taking medicines to calm yourself (sedatives or tranquilizers).  Having family members with the condition. What are the signs or symptoms?  Trouble staying asleep.  Being sleepy or tired during the day.  Getting angry a lot.  Loud snoring.  Headaches in the morning.  Not being able to focus your mind (concentrate).  Forgetting things.  Less interest in sex.  Mood swings.  Personality changes.  Feelings of sadness (depression).  Waking up a lot during the night to pee (urinate).  Dry mouth.  Sore throat. How is this diagnosed?   Your medical history.  A physical exam.  A test that is done when you are sleeping (sleep study). The test is most often done in a sleep lab but may also be done at home. How is this treated?   Sleeping on your side.  Using a medicine to get rid of mucus in your nose (decongestant).  Avoiding the use of alcohol, medicines to help you relax, or certain pain medicines (narcotics).  Losing weight, if needed.  Changing your diet.  Not smoking.  Using a machine to open your airway while you sleep, such as: ? An oral appliance. This is a mouthpiece that shifts your lower jaw forward. ? A CPAP device. This device blows air through a mask when you breathe out (exhale). ? An EPAP device. This has valves that you put in each nostril. ? A BPAP device. This device blows air through a mask when you breathe in (inhale) and breathe out.  Having surgery if other treatments do not work. It is important to get treatment for sleep apnea. Without treatment, it can lead to:  High blood pressure.  Coronary artery disease.  In men, not being able to have an erection (impotence).  Reduced thinking ability. Follow these instructions at home: Lifestyle  Make changes that your doctor recommends.  Eat a healthy diet.  Lose weight if needed.  Avoid alcohol, medicines to help you relax, and some pain medicines.  Do not use any products that contain nicotine or tobacco, such as cigarettes, e-cigarettes, and chewing tobacco. If you need  help quitting, ask your doctor. General instructions  Take over-the-counter and prescription medicines only as told by your doctor.  If you were given a machine to use while you sleep, use it only as told by your doctor.  If you are having surgery, make sure to tell your doctor you have sleep apnea. You may need to bring your device with you.  Keep all follow-up visits as told by your doctor. This is important. Contact a doctor if:  The machine that you  were given to use during sleep bothers you or does not seem to be working.  You do not get better.  You get worse. Get help right away if:  Your chest hurts.  You have trouble breathing in enough air.  You have an uncomfortable feeling in your back, arms, or stomach.  You have trouble talking.  One side of your body feels weak.  A part of your face is hanging down. These symptoms may be an emergency. Do not wait to see if the symptoms will go away. Get medical help right away. Call your local emergency services (911 in the [place] Do not drive yourself to the hospital. Summary  This condition affects breathing during sleep.  The most common cause is a collapsed or blocked airway.  The goal of treatment is to help you breathe normally while you sleep. This information is not intended to replace advice given to you by your health care provider. Make sure you discuss any questions you have with your health care provider. Document Revised: 11/23/2017 Document Reviewed: 10/02/2017 Elsevier Patient Education  [place].

## 2019-05-13 ENCOUNTER — Ambulatory Visit: Payer: 59 | Admitting: Psychology

## 2019-05-13 NOTE — Assessment & Plan Note (Signed)
-   Poor compliance, she does report benefit when used - CPAP machine not working. Order placed for new CPAP machine, supplies, mask of choice, humidification (pressure setting 9cm h2o) - Encouraged patient to aim to wear CPAP every night for minimum 4-6 hours or more - Advised not to drive if experiencing excessive daytime fatigue or somnolence - Follow-up 3 months with Dr. Elsworth Soho or APP

## 2019-05-22 ENCOUNTER — Ambulatory Visit: Payer: 59 | Admitting: Podiatry

## 2019-05-22 ENCOUNTER — Other Ambulatory Visit: Payer: Self-pay

## 2019-05-22 ENCOUNTER — Encounter: Payer: Self-pay | Admitting: Podiatry

## 2019-05-22 VITALS — Temp 97.6°F

## 2019-05-22 DIAGNOSIS — L600 Ingrowing nail: Secondary | ICD-10-CM

## 2019-05-22 DIAGNOSIS — M722 Plantar fascial fibromatosis: Secondary | ICD-10-CM | POA: Diagnosis not present

## 2019-05-22 NOTE — Progress Notes (Signed)
Subjective:   Patient ID: Janet [name], female   DOB: 54 y.o.   MRN: [id number]   HPI Patient presents stating she is improving with across is better but she has a spot that bothers her especially on the left over the right toe   ROS      Objective:  Physical Exam  Neurovascular status intact with patient's nails found to be crusted but no intense pain no drainage redness F2     Assessment:  Overall doing well with some slight crust formation     Plan:  Sterile removal of crust cleaned up the beds discussed possible padding and other treatments if symptoms persist

## 2019-05-27 ENCOUNTER — Ambulatory Visit (INDEPENDENT_AMBULATORY_CARE_PROVIDER_SITE_OTHER): Payer: 59 | Admitting: Psychology

## 2019-05-27 DIAGNOSIS — F331 Major depressive disorder, recurrent, moderate: Secondary | ICD-10-CM | POA: Diagnosis not present

## 2019-06-10 ENCOUNTER — Ambulatory Visit: Payer: 59 | Admitting: Psychology

## 2019-06-19 ENCOUNTER — Other Ambulatory Visit (HOSPITAL_BASED_OUTPATIENT_CLINIC_OR_DEPARTMENT_OTHER): Payer: Self-pay | Admitting: Family Medicine

## 2019-06-19 MED FILL — HYDROCHLOROTHIAZIDE 12.5 MG: 12.5 | 90 days supply | Qty: 90 | Fill #0

## 2019-06-19 MED FILL — ROSUVASTATIN CALCIUM 10 MG: 10 | 90 days supply | Qty: 90 | Fill #0

## 2019-06-24 ENCOUNTER — Ambulatory Visit (INDEPENDENT_AMBULATORY_CARE_PROVIDER_SITE_OTHER): Payer: 59 | Admitting: Psychology

## 2019-06-24 DIAGNOSIS — F331 Major depressive disorder, recurrent, moderate: Secondary | ICD-10-CM | POA: Diagnosis not present

## 2019-07-08 ENCOUNTER — Ambulatory Visit: Payer: 59 | Admitting: Psychology

## 2019-07-22 ENCOUNTER — Ambulatory Visit (INDEPENDENT_AMBULATORY_CARE_PROVIDER_SITE_OTHER): Payer: 59 | Admitting: Psychology

## 2019-07-22 DIAGNOSIS — F331 Major depressive disorder, recurrent, moderate: Secondary | ICD-10-CM | POA: Diagnosis not present

## 2019-08-05 ENCOUNTER — Ambulatory Visit: Payer: 59 | Admitting: Psychology

## 2019-08-18 ENCOUNTER — Ambulatory Visit: Payer: 59 | Admitting: Pulmonary Disease

## 2019-08-19 ENCOUNTER — Ambulatory Visit (INDEPENDENT_AMBULATORY_CARE_PROVIDER_SITE_OTHER): Payer: 59 | Admitting: Psychology

## 2019-08-19 ENCOUNTER — Ambulatory Visit: Payer: 59 | Admitting: Psychology

## 2019-08-19 DIAGNOSIS — F4323 Adjustment disorder with mixed anxiety and depressed mood: Secondary | ICD-10-CM

## 2019-08-22 ENCOUNTER — Other Ambulatory Visit (HOSPITAL_BASED_OUTPATIENT_CLINIC_OR_DEPARTMENT_OTHER): Payer: Self-pay | Admitting: Family Medicine

## 2019-08-22 MED FILL — METOPROLOL SUCCINATE ER 100: 100 | 90 days supply | Qty: 90 | Fill #0

## 2019-08-22 MED FILL — AMLODIPINE BESYLATE 5 MG TA: 5 | 90 days supply | Qty: 90 | Fill #0

## 2019-08-26 ENCOUNTER — Other Ambulatory Visit: Payer: Self-pay | Admitting: Family Medicine

## 2019-08-26 DIAGNOSIS — N6082 Other benign mammary dysplasias of left breast: Secondary | ICD-10-CM

## 2019-09-02 ENCOUNTER — Ambulatory Visit: Payer: 59 | Admitting: Psychology

## 2019-09-03 IMAGING — MG DIGITAL DIAGNOSTIC UNILATERAL LEFT MAMMOGRAM WITH TOMO AND CAD
8 series · 8 of 24 positions shown · non-contrast
Comparison: Previous exam(s).

CLINICAL DATA: Patient was recalled from screening for in asymmetry
in the outer left breast. Patient also reports an intermittent
palpable area in the medial aspect of the breast.

EXAM:
DIGITAL DIAGNOSTIC LEFT MAMMOGRAM WITH TOMO
ULTRASOUND LEFT BREAST

[L ML synth-2D]
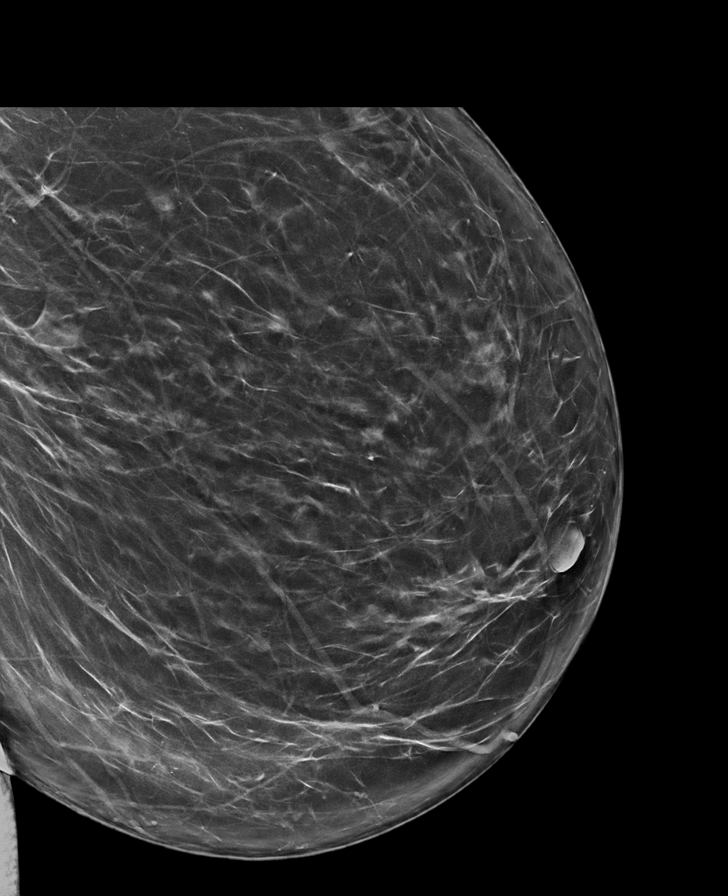

[L CC synth-2D (1 of 3)]
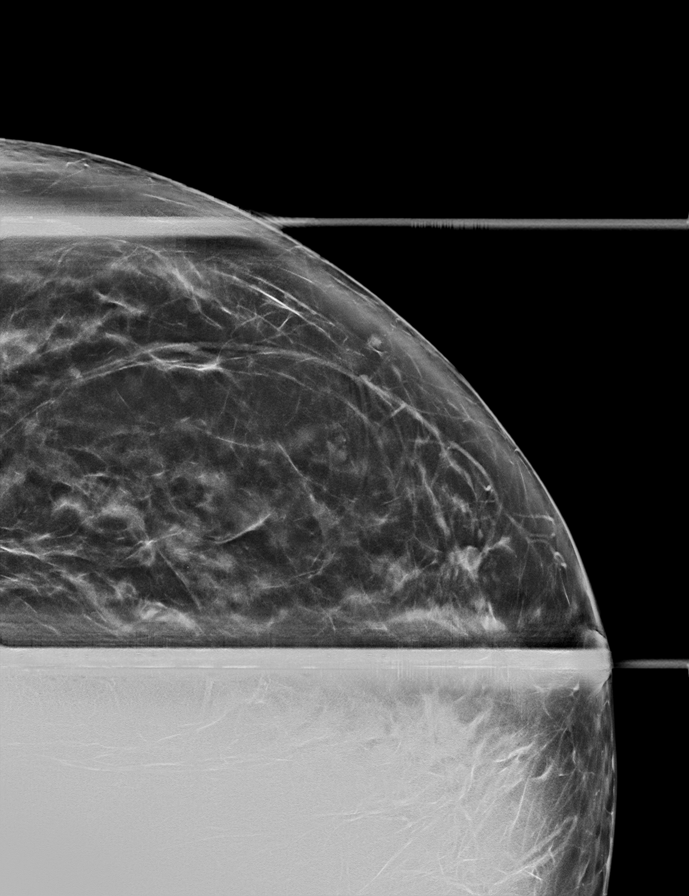

[L CC synth-2D (2 of 3)]
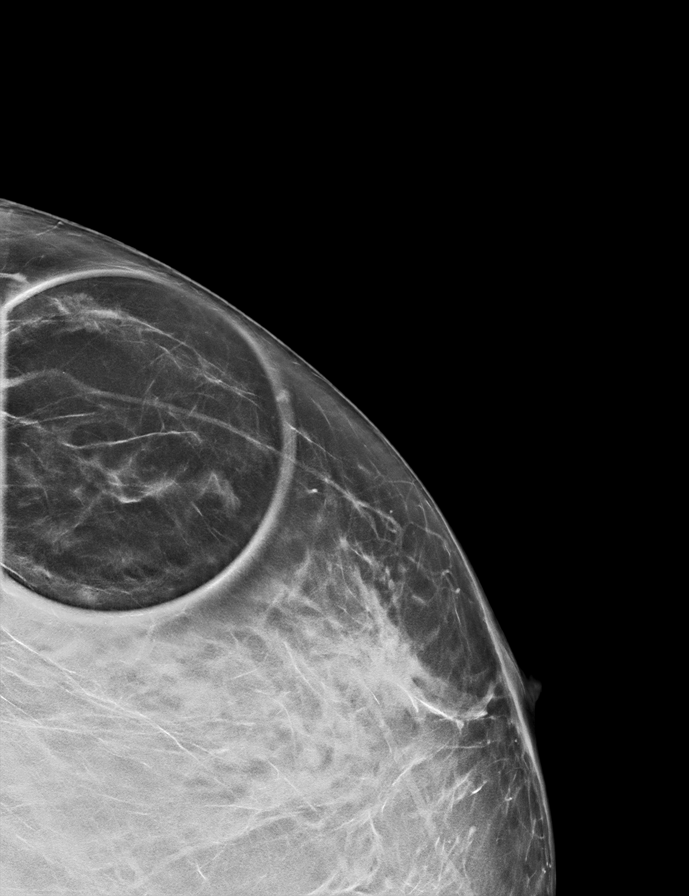

[L CC synth-2D (3 of 3)]
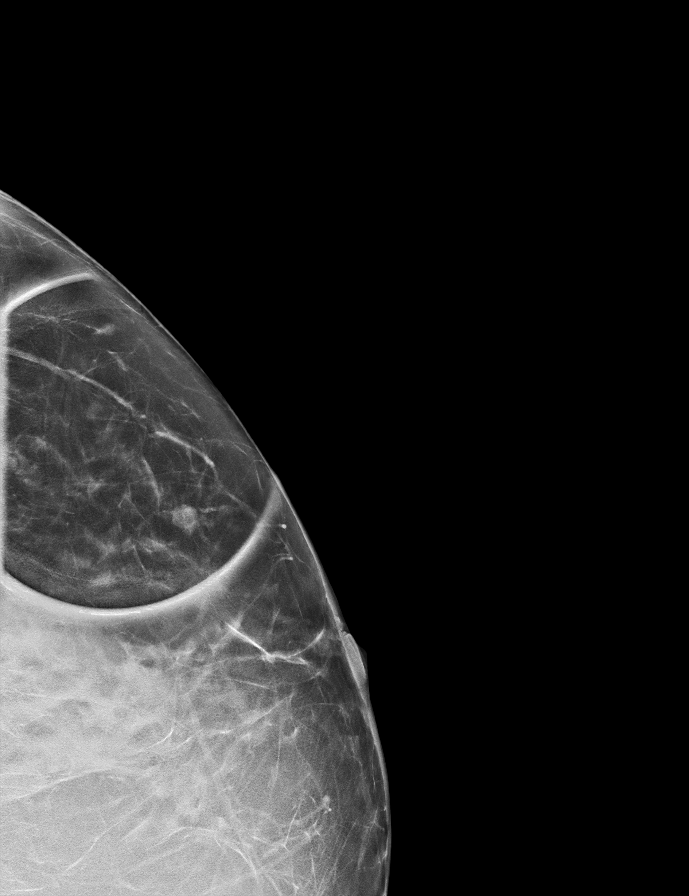

[L CC tomo (1 of 3) · tomo slice 35/69.0]
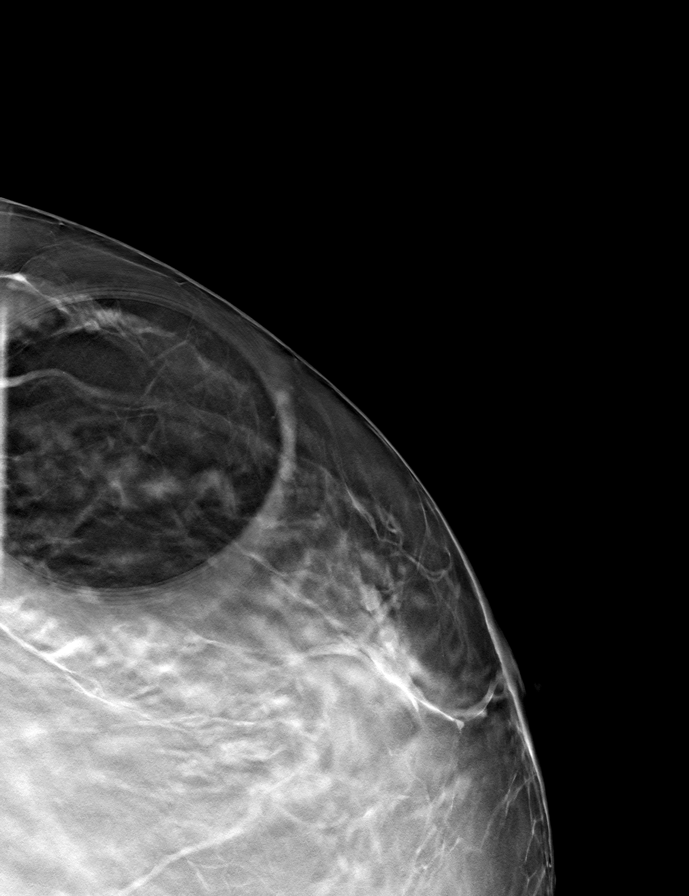

[L CC tomo (2 of 3) · tomo slice 31/61.0]
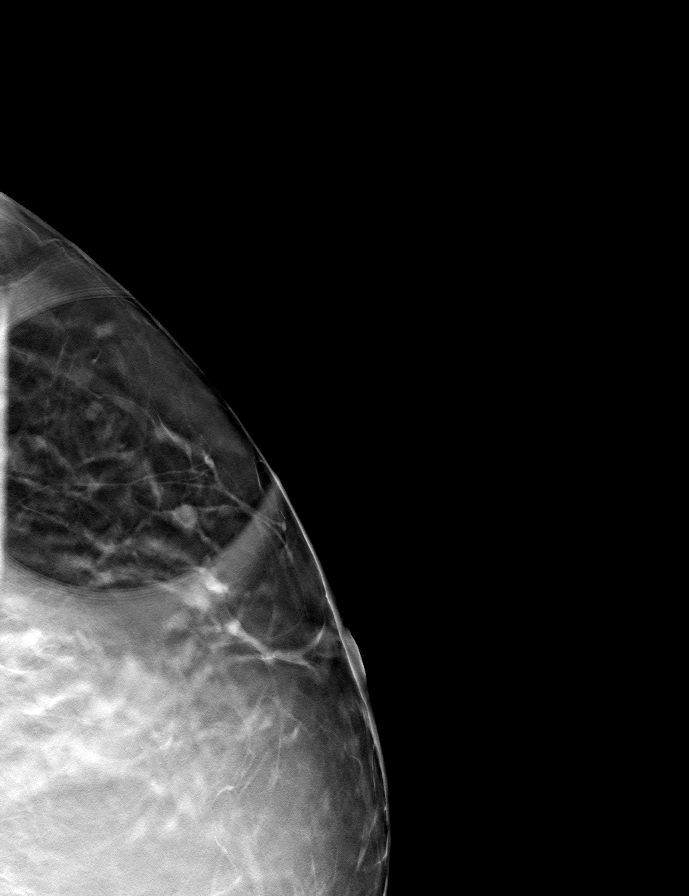

[L ML tomo · tomo slice 43/86.0]
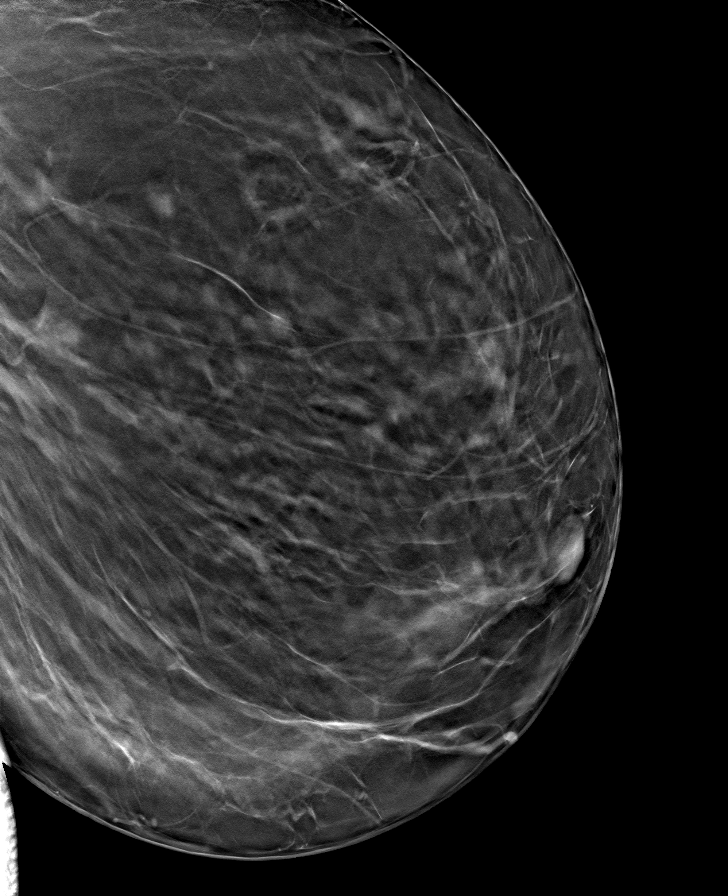

[L CC tomo (3 of 3) · tomo slice 39/76.0]
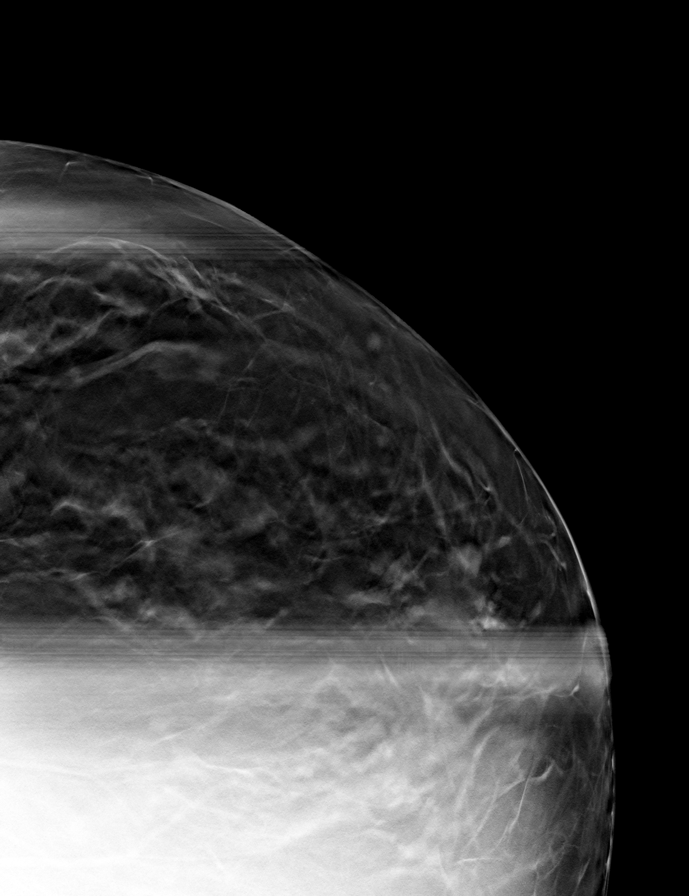

[8 of 24 positions shown; findings below may reference images not displayed]

ACR Breast Density Category b: There are scattered areas of
fibroglandular density.
FINDINGS: Left breast:

Mammogram: Spot-compression views of the outer breast demonstrate
persistence of a cluster of small masses. Review of images from the
recent screening mammogram demonstrate no discrete abnormality in
the medial breast at the palpable site of concern. Mammographic
images were processed with CAD.

Physical Exam: No palpable abnormality is identified in the
patient's area of concern in the medial breast.

Ultrasound: Targeted ultrasound is performed showing a predominantly
anechoic mass suggestive of a cluster of cysts at the 2 o'clock
position 8 cm from the nipple. There is a small area with internal
echoes along the inferior aspect of the mass. No internal blood flow
demonstrated. Targeted ultrasound was also performed in the medial
left breast at 9 o'clock 12 cm from the nipple in the area of
palpable concern demonstrating no suspicious cystic or solid mass.

Targeted ultrasound of the left axilla demonstrates normal-appearing
lymph nodes.
IMPRESSION: Probable cluster of cysts in the left breast at 2 o'clock.

RECOMMENDATION:
Targeted left breast ultrasound in 6 months is recommended for this
probably benign mass.

I have discussed the findings and recommendations with the patient.
Results were also provided in writing at the conclusion of the
visit. If applicable, a reminder letter will be sent to the patient
regarding the next appointment.

BI-RADS CATEGORY  3: Probably benign.

## 2019-09-16 ENCOUNTER — Ambulatory Visit: Payer: 59 | Admitting: Psychology

## 2019-09-30 ENCOUNTER — Ambulatory Visit: Payer: 59 | Admitting: Psychology

## 2019-10-06 MED FILL — ROSUVASTATIN CALCIUM 10 MG: 10 | 90 days supply | Qty: 90 | Fill #1

## 2019-10-06 MED FILL — HYDROCHLOROTHIAZIDE 12.5 MG: 12.5 | 90 days supply | Qty: 90 | Fill #1

## 2019-10-13 ENCOUNTER — Ambulatory Visit: Payer: 59 | Admitting: Psychology

## 2019-10-13 ENCOUNTER — Ambulatory Visit (INDEPENDENT_AMBULATORY_CARE_PROVIDER_SITE_OTHER): Payer: 59 | Admitting: Psychology

## 2019-10-13 DIAGNOSIS — F4323 Adjustment disorder with mixed anxiety and depressed mood: Secondary | ICD-10-CM

## 2019-10-14 ENCOUNTER — Ambulatory Visit: Payer: 59 | Admitting: Psychology

## 2019-10-22 ENCOUNTER — Other Ambulatory Visit: Payer: Self-pay | Admitting: Obstetrics & Gynecology

## 2019-10-22 DIAGNOSIS — N6082 Other benign mammary dysplasias of left breast: Secondary | ICD-10-CM

## 2019-10-24 ENCOUNTER — Ambulatory Visit
Admission: RE | Admit: 2019-10-24 | Discharge: 2019-10-24 | Disposition: A | Discharge status: Home / Self Care | Payer: 59 | Source: Ambulatory Visit | Attending: Family Medicine | Admitting: Family Medicine

## 2019-10-24 ENCOUNTER — Other Ambulatory Visit: Payer: Self-pay

## 2019-10-24 DIAGNOSIS — N6082 Other benign mammary dysplasias of left breast: Secondary | ICD-10-CM

## 2019-11-17 ENCOUNTER — Ambulatory Visit (INDEPENDENT_AMBULATORY_CARE_PROVIDER_SITE_OTHER): Payer: 59 | Admitting: Psychology

## 2019-11-17 DIAGNOSIS — F331 Major depressive disorder, recurrent, moderate: Secondary | ICD-10-CM

## 2019-11-18 ENCOUNTER — Encounter: Payer: 59 | Admitting: Obstetrics & Gynecology

## 2019-11-24 ENCOUNTER — Encounter: Payer: 59 | Admitting: Obstetrics & Gynecology

## 2019-11-25 ENCOUNTER — Encounter: Payer: 59 | Admitting: Obstetrics & Gynecology

## 2019-11-25 ENCOUNTER — Ambulatory Visit (INDEPENDENT_AMBULATORY_CARE_PROVIDER_SITE_OTHER): Payer: 59 | Admitting: Obstetrics & Gynecology

## 2019-11-25 ENCOUNTER — Other Ambulatory Visit: Payer: Self-pay

## 2019-11-25 ENCOUNTER — Encounter: Payer: Self-pay | Admitting: Obstetrics & Gynecology

## 2019-11-25 VITALS — BP 118/80 | Ht 64.0 in | Wt 245.0 lb

## 2019-11-25 DIAGNOSIS — Z90710 Acquired absence of both cervix and uterus: Secondary | ICD-10-CM | POA: Diagnosis not present

## 2019-11-25 DIAGNOSIS — Z01419 Encounter for gynecological examination (general) (routine) without abnormal findings: Secondary | ICD-10-CM | POA: Diagnosis not present

## 2019-11-25 DIAGNOSIS — Z6841 Body Mass Index (BMI) 40.0 and over, adult: Secondary | ICD-10-CM

## 2019-11-25 DIAGNOSIS — Z780 Asymptomatic menopausal state: Secondary | ICD-10-CM

## 2019-11-25 NOTE — Progress Notes (Signed)
[place]lette [birth year] [id number]   History:    54 y.o. G3P2A1L2 Divorced.  2 daughters doing well.  RP:  Established patient presenting for annual gyn exam   HPI: Post menopause, well on no HRT.  S/P Total Hysterectomy.  No current pelvic pain.  Abstinent.  Urine and bowel movements normal.  Breasts normal.  Planning a bilateral breast reduction.  Body mass index 42.05.  Needs to restart low calorie/carb diet and physical activities. 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  Past medical history,surgical history, family history and social history were all reviewed and documented in the EPIC chart.  Gynecologic History Patient's last menstrual period was 07/22/2015.  Obstetric History OB History  Gravida Para Term Preterm AB Living  3 2     1 2   SAB TAB Ectopic Multiple Live Births  1            # Outcome Date GA Lbr Len/2nd Weight Sex Delivery Anes PTL Lv  3 SAB           2 Para           1 Para              ROS: A ROS was performed and pertinent positives and negatives are included in the history.  GENERAL: No fevers or chills. HEENT: No change in vision, no earache, sore throat or sinus congestion. NECK: No pain or stiffness. CARDIOVASCULAR: No chest pain or pressure. No palpitations. PULMONARY: No shortness of breath, cough or wheeze. GASTROINTESTINAL: No abdominal pain, nausea, vomiting or diarrhea, melena or bright red blood per rectum. GENITOURINARY: No urinary frequency, urgency, hesitancy or dysuria. MUSCULOSKELETAL: No joint or muscle pain, no back pain, no recent trauma. DERMATOLOGIC: No rash, no itching, no lesions. ENDOCRINE: No polyuria, polydipsia, no heat or cold intolerance. No recent change in weight. HEMATOLOGICAL: No anemia or easy bruising or bleeding. NEUROLOGIC: No headache, seizures, numbness, tingling or weakness. PSYCHIATRIC: No depression, no loss of interest in normal activity or change in sleep pattern.     Exam:   BP 118/80 (BP Location: Right  Arm, Patient Position: Sitting, Cuff Size: Large)   Ht 5\' 4"  (1.626 m)   Wt 245 lb (111.1 kg)   LMP 07/22/2015   BMI 42.05 kg/m   Body mass index is 42.05 kg/m.  General appearance : Well developed well nourished female. No acute distress HEENT: Eyes: no retinal hemorrhage or exudates,  Neck supple, trachea midline, no carotid bruits, no thyroidmegaly Lungs: Clear to auscultation, no rhonchi or wheezes, or rib retractions  Heart: Regular rate and rhythm, no murmurs or gallops Breast:Examined in sitting and supine position were symmetrical in appearance, no palpable masses or tenderness,  no skin retraction, no nipple inversion, no nipple discharge, no skin discoloration, no axillary or supraclavicular lymphadenopathy Abdomen: no palpable masses or tenderness, no rebound or guarding Extremities: no edema or skin discoloration or tenderness  Pelvic: Vulva: Normal             Vagina: No gross lesions or discharge  Cervix/Uterus absent  Adnexa  Without masses or tenderness  Anus: Normal   Assessment/Plan:  54 y.o. female for annual exam   1. Well female exam with routine gynecological exam Gynecologic exam status post total hysterectomy and menopause.  No indication to repeat a Pap test this year.  Breast exam normal.  Screening mammogram September 2021 was negative.  Colonoscopy 2020.  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  2. H/O total hysterectomy  3. Postmenopause Postmenopausal, well on no hormone replacement therapy.  Vitamin D supplements, calcium intake of 1500 mg daily and regular weightbearing physical activity is recommended.  4. Class 3 severe obesity due to excess calories without serious comorbidity with body mass index (BMI) of 40.0 to 44.9 in adult [facility]) Recommend to go back to a lower calorie/carb diet.  Aerobic activities 5 times a week and light weightlifting every 2 days.  Princess Bruins MD, 4:39 PM 11/25/2019

## 2019-11-28 ENCOUNTER — Encounter: Payer: Self-pay | Admitting: Obstetrics & Gynecology

## 2019-12-08 MED FILL — AMLODIPINE BESYLATE 5 MG TA: 5 | 90 days supply | Qty: 90 | Fill #1

## 2019-12-08 MED FILL — METOPROLOL SUCCINATE ER 100: 100 | 90 days supply | Qty: 90 | Fill #1

## 2019-12-15 ENCOUNTER — Ambulatory Visit: Payer: 59 | Admitting: Psychology

## 2019-12-18 ENCOUNTER — Encounter: Payer: 59 | Admitting: Obstetrics & Gynecology

## 2020-01-12 ENCOUNTER — Ambulatory Visit (INDEPENDENT_AMBULATORY_CARE_PROVIDER_SITE_OTHER): Payer: 59 | Admitting: Psychology

## 2020-01-12 DIAGNOSIS — F4323 Adjustment disorder with mixed anxiety and depressed mood: Secondary | ICD-10-CM

## 2020-01-12 MED FILL — ROSUVASTATIN CALCIUM 10 MG: 10 | 90 days supply | Qty: 90 | Fill #2

## 2020-01-12 MED FILL — HYDROCHLOROTHIAZIDE 12.5 MG: 12.5 | 90 days supply | Qty: 90 | Fill #2

## 2020-02-09 ENCOUNTER — Ambulatory Visit (INDEPENDENT_AMBULATORY_CARE_PROVIDER_SITE_OTHER): Payer: 59 | Admitting: Psychology

## 2020-02-09 DIAGNOSIS — F4323 Adjustment disorder with mixed anxiety and depressed mood: Secondary | ICD-10-CM | POA: Diagnosis not present

## 2020-02-16 ENCOUNTER — Ambulatory Visit: Payer: 59 | Admitting: Psychology

## 2020-02-24 ENCOUNTER — Ambulatory Visit: Payer: 59 | Attending: Internal Medicine

## 2020-02-24 ENCOUNTER — Other Ambulatory Visit (HOSPITAL_BASED_OUTPATIENT_CLINIC_OR_DEPARTMENT_OTHER): Payer: Self-pay | Admitting: Internal Medicine

## 2020-02-24 DIAGNOSIS — Z23 Encounter for immunization: Secondary | ICD-10-CM

## 2020-02-24 NOTE — Progress Notes (Signed)
   Covid-19 Vaccination Clinic  Name:  [name]    MRN: [id number] DOB: [birth year]  02/24/2020  [name] was observed post Covid-19 immunization for 15 minutes without incident. She was provided with Vaccine Information Sheet and instruction to access the V-Safe system.  Vaccinated by Fredirick Maudlin  [name] was instructed to call 911 with any severe reactions post vaccine: Marland Kitchen Difficulty breathing  . Swelling of face and throat  . A fast heartbeat  . A bad rash all over body  . Dizziness and weakness   Immunizations Administered    Name Date Dose VIS Date Route   Moderna Covid-19 Booster Vaccine 02/24/2020 11:41 AM 0.25 mL 12/10/2019 Intramuscular   Manufacturer: Moderna   Lot: [id number]   NDC: 64332-951-88

## 2020-02-25 MED FILL — MODERNA COVID-19 VACCINE 10: 100 | 28 days supply | Qty: 0 | Fill #0

## 2020-03-11 ENCOUNTER — Other Ambulatory Visit (HOSPITAL_BASED_OUTPATIENT_CLINIC_OR_DEPARTMENT_OTHER): Payer: Self-pay | Admitting: Family Medicine

## 2020-03-11 MED FILL — METOPROLOL SUCCINATE ER 100: 100 | 90 days supply | Qty: 90 | Fill #0

## 2020-03-11 MED FILL — AMLODIPINE BESYLATE 5 MG TA: 5 | 90 days supply | Qty: 90 | Fill #0

## 2020-03-15 ENCOUNTER — Ambulatory Visit (INDEPENDENT_AMBULATORY_CARE_PROVIDER_SITE_OTHER): Payer: 59 | Admitting: Psychology

## 2020-03-15 DIAGNOSIS — F4323 Adjustment disorder with mixed anxiety and depressed mood: Secondary | ICD-10-CM

## 2020-03-26 MED FILL — HYDROCHLOROTHIAZIDE 12.5 MG: 12.5 | 90 days supply | Qty: 90 | Fill #0

## 2020-03-26 MED FILL — ROSUVASTATIN CALCIUM 10 MG: 10 | 90 days supply | Qty: 90 | Fill #0

## 2020-04-19 ENCOUNTER — Ambulatory Visit (INDEPENDENT_AMBULATORY_CARE_PROVIDER_SITE_OTHER): Payer: 59 | Admitting: Psychology

## 2020-04-19 DIAGNOSIS — F4323 Adjustment disorder with mixed anxiety and depressed mood: Secondary | ICD-10-CM

## 2020-05-27 DIAGNOSIS — I4891 Unspecified atrial fibrillation: Secondary | ICD-10-CM

## 2020-05-27 HISTORY — DX: Unspecified atrial fibrillation: I48.91

## 2020-06-18 ENCOUNTER — Other Ambulatory Visit: Payer: Self-pay | Admitting: Family Medicine

## 2020-06-18 DIAGNOSIS — R109 Unspecified abdominal pain: Secondary | ICD-10-CM

## 2020-06-19 MED FILL — Metoprolol Succinate Tab ER 24HR 100 MG (Tartrate Equiv): ORAL | 90 days supply | Qty: 90 | Fill #0 | Status: AC

## 2020-06-19 MED FILL — Amlodipine Besylate Tab 5 MG (Base Equivalent): ORAL | 90 days supply | Qty: 90 | Fill #0 | Status: AC

## 2020-06-21 ENCOUNTER — Other Ambulatory Visit (HOSPITAL_BASED_OUTPATIENT_CLINIC_OR_DEPARTMENT_OTHER): Payer: Self-pay

## 2020-06-21 MED ORDER — METFORMIN HCL ER 500 MG PO TB24
ORAL_TABLET | ORAL | 3 refills | Status: DC
  Filled 2020-06-21: qty 30, 30d supply, fill #0
  Filled 2020-08-16: qty 30, 30d supply, fill #1
  Filled 2020-09-14: qty 30, 30d supply, fill #2
  Filled 2020-10-13: qty 30, 30d supply, fill #3

## 2020-06-29 ENCOUNTER — Ambulatory Visit
Admission: RE | Admit: 2020-06-29 | Discharge: 2020-06-29 | Disposition: A | Discharge status: Home / Self Care | Payer: 59 | Source: Ambulatory Visit | Attending: Family Medicine | Admitting: Family Medicine

## 2020-06-29 DIAGNOSIS — R109 Unspecified abdominal pain: Secondary | ICD-10-CM

## 2020-07-01 ENCOUNTER — Other Ambulatory Visit: Payer: 59

## 2020-07-14 MED FILL — Rosuvastatin Calcium Tab 10 MG: ORAL | 90 days supply | Qty: 90 | Fill #0 | Status: AC

## 2020-07-15 ENCOUNTER — Other Ambulatory Visit (HOSPITAL_BASED_OUTPATIENT_CLINIC_OR_DEPARTMENT_OTHER): Payer: Self-pay

## 2020-07-16 ENCOUNTER — Other Ambulatory Visit (HOSPITAL_BASED_OUTPATIENT_CLINIC_OR_DEPARTMENT_OTHER): Payer: Self-pay

## 2020-07-16 MED FILL — Hydrochlorothiazide Cap 12.5 MG: ORAL | 90 days supply | Qty: 90 | Fill #0 | Status: AC

## 2020-07-28 ENCOUNTER — Other Ambulatory Visit: Payer: Self-pay | Admitting: Family Medicine

## 2020-07-28 DIAGNOSIS — R9389 Abnormal findings on diagnostic imaging of other specified body structures: Secondary | ICD-10-CM

## 2020-08-12 ENCOUNTER — Ambulatory Visit
Admission: RE | Admit: 2020-08-12 | Discharge: 2020-08-12 | Disposition: A | Discharge status: Home / Self Care | Payer: 59 | Source: Ambulatory Visit | Attending: Family Medicine | Admitting: Family Medicine

## 2020-08-12 ENCOUNTER — Other Ambulatory Visit: Payer: Self-pay

## 2020-08-12 DIAGNOSIS — R9389 Abnormal findings on diagnostic imaging of other specified body structures: Secondary | ICD-10-CM

## 2020-08-12 MED ORDER — IOPAMIDOL (ISOVUE-300) INJECTION 61%
100.0000 mL | Freq: Once | INTRAVENOUS | Status: AC | PRN
  Administered 2020-08-12: 100 mL via INTRAVENOUS

## 2020-08-16 ENCOUNTER — Other Ambulatory Visit (HOSPITAL_BASED_OUTPATIENT_CLINIC_OR_DEPARTMENT_OTHER): Payer: Self-pay

## 2020-09-09 ENCOUNTER — Ambulatory Visit: Payer: 59

## 2020-09-14 ENCOUNTER — Ambulatory Visit: Payer: 59 | Attending: Internal Medicine

## 2020-09-14 ENCOUNTER — Other Ambulatory Visit: Payer: Self-pay | Admitting: Obstetrics & Gynecology

## 2020-09-14 ENCOUNTER — Other Ambulatory Visit (HOSPITAL_BASED_OUTPATIENT_CLINIC_OR_DEPARTMENT_OTHER): Payer: Self-pay

## 2020-09-14 DIAGNOSIS — Z23 Encounter for immunization: Secondary | ICD-10-CM

## 2020-09-14 DIAGNOSIS — Z1231 Encounter for screening mammogram for malignant neoplasm of breast: Secondary | ICD-10-CM

## 2020-09-14 NOTE — Progress Notes (Signed)
   Covid-19 Vaccination Clinic  Name:  Janet [name]    MRN: [id number] DOB: 01-06-1966  09/14/2020  [name] was observed post Covid-19 immunization for 15 minutes without incident. She was provided with Vaccine Information Sheet and instruction to access the V-Safe system.   [name] was instructed to call 911 with any severe reactions post vaccine: Difficulty breathing  Swelling of face and throat  A fast heartbeat  A bad rash all over body  Dizziness and weakness   Immunizations Administered     Name Date Dose VIS Date Route   Moderna Covid-19 Booster Vaccine 09/14/2020  1:22 PM 0.25 mL 12/10/2019 Intramuscular   Manufacturer: Moderna   Lot: [id number]   [id number]

## 2020-09-16 ENCOUNTER — Ambulatory Visit: Payer: 59

## 2020-09-17 ENCOUNTER — Other Ambulatory Visit (HOSPITAL_BASED_OUTPATIENT_CLINIC_OR_DEPARTMENT_OTHER): Payer: Self-pay

## 2020-09-17 MED ORDER — COVID-19 MRNA VACC (MODERNA) 100 MCG/0.5ML IM SUSP
INTRAMUSCULAR | 0 refills | Status: DC
  Filled 2020-09-17: qty 0.25, 1d supply, fill #0

## 2020-09-23 ENCOUNTER — Ambulatory Visit: Payer: 59

## 2020-09-27 ENCOUNTER — Other Ambulatory Visit (HOSPITAL_BASED_OUTPATIENT_CLINIC_OR_DEPARTMENT_OTHER): Payer: Self-pay

## 2020-09-27 MED FILL — Metoprolol Succinate Tab ER 24HR 100 MG (Tartrate Equiv): ORAL | 90 days supply | Qty: 90 | Fill #1 | Status: AC

## 2020-09-27 MED FILL — Amlodipine Besylate Tab 5 MG (Base Equivalent): ORAL | 90 days supply | Qty: 90 | Fill #1 | Status: AC

## 2020-10-13 MED FILL — Hydrochlorothiazide Cap 12.5 MG: ORAL | 90 days supply | Qty: 90 | Fill #1 | Status: AC

## 2020-10-14 ENCOUNTER — Other Ambulatory Visit (HOSPITAL_BASED_OUTPATIENT_CLINIC_OR_DEPARTMENT_OTHER): Payer: Self-pay

## 2020-10-26 MED FILL — Rosuvastatin Calcium Tab 10 MG: ORAL | 90 days supply | Qty: 90 | Fill #1 | Status: AC

## 2020-10-27 ENCOUNTER — Other Ambulatory Visit (HOSPITAL_BASED_OUTPATIENT_CLINIC_OR_DEPARTMENT_OTHER): Payer: Self-pay

## 2020-11-05 ENCOUNTER — Other Ambulatory Visit: Payer: Self-pay

## 2020-11-05 ENCOUNTER — Ambulatory Visit
Admission: RE | Admit: 2020-11-05 | Discharge: 2020-11-05 | Disposition: A | Discharge status: Home / Self Care | Payer: 59 | Source: Ambulatory Visit | Attending: Obstetrics & Gynecology | Admitting: Obstetrics & Gynecology

## 2020-11-05 DIAGNOSIS — Z1231 Encounter for screening mammogram for malignant neoplasm of breast: Secondary | ICD-10-CM

## 2020-11-16 ENCOUNTER — Other Ambulatory Visit (HOSPITAL_BASED_OUTPATIENT_CLINIC_OR_DEPARTMENT_OTHER): Payer: Self-pay

## 2020-11-16 MED ORDER — METFORMIN HCL ER 500 MG PO TB24
500.0000 mg | ORAL_TABLET | Freq: Every day | ORAL | 0 refills | Status: DC
  Filled 2020-11-16: qty 90, 90d supply, fill #0

## 2020-11-17 ENCOUNTER — Other Ambulatory Visit (HOSPITAL_BASED_OUTPATIENT_CLINIC_OR_DEPARTMENT_OTHER): Payer: Self-pay

## 2020-12-28 ENCOUNTER — Other Ambulatory Visit (HOSPITAL_COMMUNITY): Payer: Self-pay

## 2020-12-31 ENCOUNTER — Other Ambulatory Visit: Payer: Self-pay

## 2020-12-31 ENCOUNTER — Ambulatory Visit (INDEPENDENT_AMBULATORY_CARE_PROVIDER_SITE_OTHER): Payer: 59 | Admitting: Obstetrics & Gynecology

## 2020-12-31 ENCOUNTER — Encounter: Payer: Self-pay | Admitting: Obstetrics & Gynecology

## 2020-12-31 VITALS — BP 128/90 | HR 72 | Resp 16 | Ht 63.75 in | Wt 245.0 lb

## 2020-12-31 DIAGNOSIS — Z90710 Acquired absence of both cervix and uterus: Secondary | ICD-10-CM | POA: Diagnosis not present

## 2020-12-31 DIAGNOSIS — Z780 Asymptomatic menopausal state: Secondary | ICD-10-CM

## 2020-12-31 DIAGNOSIS — Z01419 Encounter for gynecological examination (general) (routine) without abnormal findings: Secondary | ICD-10-CM

## 2020-12-31 DIAGNOSIS — Z6841 Body Mass Index (BMI) 40.0 and over, adult: Secondary | ICD-10-CM

## 2020-12-31 NOTE — Progress Notes (Signed)
[place] [birth year] [id number]   History:    55 y.o. G3P2A1L2 Divorced.  2 daughters doing well, will probably be both in [place], [place] for studies.   RP:  Established patient presenting for annual gyn exam    HPI: Postmenopause, well on no HRT.  S/P Total Hysterectomy.  No current pelvic pain.  Abstinent. Last Pap 10/2017 Neg.  Urine and bowel movements normal.  Breasts normal.  Mammo Neg 10/2020.  Planning a bilateral breast reduction.  Body mass index 42.38.  Needs to restart low calorie/carb diet, establishing with a nutritionist, and physical activities. 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 [place] 2020.   Past medical history,surgical history, family history and social history were all reviewed and documented in the EPIC chart.  Gynecologic History Patient's last menstrual period was 07/22/2015.  Obstetric History OB History  Gravida Para Term Preterm AB Living  3 2     1 2   SAB IAB Ectopic Multiple Live Births  1            # Outcome Date GA Lbr Len/2nd Weight Sex Delivery Anes PTL Lv  3 SAB           2 Para           1 Para              ROS: A ROS was performed and pertinent positives and negatives are included in the history.  GENERAL: No fevers or chills. HEENT: No change in vision, no earache, sore throat or sinus congestion. NECK: No pain or stiffness. CARDIOVASCULAR: No chest pain or pressure. No palpitations. PULMONARY: No shortness of breath, cough or wheeze. GASTROINTESTINAL: No abdominal pain, nausea, vomiting or diarrhea, melena or bright red blood per rectum. GENITOURINARY: No urinary frequency, urgency, hesitancy or dysuria. MUSCULOSKELETAL: No joint or muscle pain, no back pain, no recent trauma. DERMATOLOGIC: No rash, no itching, no lesions. ENDOCRINE: No polyuria, polydipsia, no heat or cold intolerance. No recent change in weight. HEMATOLOGICAL: No anemia or easy bruising or bleeding. NEUROLOGIC: No headache, seizures, numbness, tingling or weakness.  PSYCHIATRIC: No depression, no loss of interest in normal activity or change in sleep pattern.     Exam:   BP 128/90   Pulse 72   Resp 16   Ht 5' 3.75" (1.619 m)   Wt 245 lb (111.1 kg)   LMP 07/22/2015   BMI 42.38 kg/m   Body mass index is 42.38 kg/m.  General appearance : Well developed well nourished female. No acute distress HEENT: Eyes: no retinal hemorrhage or exudates,  Neck supple, trachea midline, no carotid bruits, no thyroidmegaly Lungs: Clear to auscultation, no rhonchi or wheezes, or rib retractions  Heart: Regular rate and rhythm, no murmurs or gallops Breast:Examined in sitting and supine position were symmetrical in appearance, no palpable masses or tenderness,  no skin retraction, no nipple inversion, no nipple discharge, no skin discoloration, no axillary or supraclavicular lymphadenopathy Abdomen: no palpable masses or tenderness, no rebound or guarding Extremities: no edema or skin discoloration or tenderness  Pelvic: Vulva: Normal             Vagina: No gross lesions or discharge  Cervix/Uterus absent  Adnexa  Without masses or tenderness  Anus: Normal   Assessment/Plan:  55 y.o. female for annual exam   1. Well female exam with routine gynecological exam Postmenopause, well on no HRT.  S/P Total Hysterectomy.  No current pelvic pain.  Abstinent. Last Pap 10/2017 Neg.  Will repeat Pap at 5 yrs.  Urine and bowel movements normal.  Breasts normal.  Mammo Neg 10/2020.  Planning a bilateral breast reduction.  Body mass index 42.38.  Needs to restart low calorie/carb diet, establishing with a nutritionist, and physical activities. 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 Colono 2020.  2. H/O total hysterectomy  3. Postmenopause Well on no HRT.  4. Class 3 severe obesity due to excess calories without serious comorbidity with body mass index (BMI) of 40.0 to 44.9 in adult [facility])  Needs to restart low calorie/carb diet, establishing with a nutritionist, and physical  activities.  Princess Bruins MD, 11:12 AM 12/31/2020

## 2021-01-03 ENCOUNTER — Other Ambulatory Visit (HOSPITAL_BASED_OUTPATIENT_CLINIC_OR_DEPARTMENT_OTHER): Payer: Self-pay

## 2021-01-03 MED ORDER — METFORMIN HCL ER 500 MG PO TB24
ORAL_TABLET | ORAL | 0 refills | Status: DC
  Filled 2021-02-14: qty 90, 90d supply, fill #0

## 2021-01-03 MED ORDER — ROSUVASTATIN CALCIUM 10 MG PO TABS
10.0000 mg | ORAL_TABLET | Freq: Every day | ORAL | 0 refills | Status: DC
  Filled 2021-02-04: qty 90, 90d supply, fill #0

## 2021-01-03 MED ORDER — METOPROLOL SUCCINATE ER 100 MG PO TB24
100.0000 mg | ORAL_TABLET | Freq: Every day | ORAL | 0 refills | Status: DC
  Filled 2021-01-03: qty 90, 90d supply, fill #0

## 2021-01-03 MED ORDER — AMLODIPINE BESYLATE 5 MG PO TABS
5.0000 mg | ORAL_TABLET | Freq: Every day | ORAL | 0 refills | Status: DC
  Filled 2021-01-03: qty 90, 90d supply, fill #0

## 2021-01-03 MED ORDER — HYDROCHLOROTHIAZIDE 12.5 MG PO CAPS
ORAL_CAPSULE | ORAL | 0 refills | Status: DC
  Filled 2021-01-03: qty 90, 90d supply, fill #0

## 2021-02-04 ENCOUNTER — Other Ambulatory Visit (HOSPITAL_BASED_OUTPATIENT_CLINIC_OR_DEPARTMENT_OTHER): Payer: Self-pay

## 2021-02-15 ENCOUNTER — Other Ambulatory Visit (HOSPITAL_BASED_OUTPATIENT_CLINIC_OR_DEPARTMENT_OTHER): Payer: Self-pay

## 2021-03-16 ENCOUNTER — Ambulatory Visit: Payer: 59 | Admitting: Podiatry

## 2021-03-16 ENCOUNTER — Other Ambulatory Visit: Payer: Self-pay

## 2021-03-16 ENCOUNTER — Encounter: Payer: Self-pay | Admitting: Podiatry

## 2021-03-16 DIAGNOSIS — L600 Ingrowing nail: Secondary | ICD-10-CM | POA: Diagnosis not present

## 2021-03-16 NOTE — Patient Instructions (Signed)

## 2021-03-19 NOTE — Progress Notes (Signed)
Subjective:   Patient ID: Janet [name], female   DOB: 56 y.o.   MRN: [id number]   HPI Patient presents concerned about a spicule of nail bed on her left big toe that is been bothering her and makes it at times hard to wear shoe gear and it can be irritative.  Does not remember injury and it was done several years ago   ROS      Objective:  Physical Exam  Neurovascular status intact with patient's left hallux medial border showing a small spicule localized with adequate chemical destruction of the remainder of the nailbed     Assessment:  Damaged left hallux nail with a small area of spicule in the medial border mild to moderate pain     Plan:  Reviewed condition options available and she is opted to have the spicule removed.  I allowed her to read consent form understanding risk and she signed and I infiltrated the left hallux 60 mg like Marcaine mixture sterile prep done to the toe and using sterile instrumentation removed the medial border removed spicule applied chemical 3 applications 30 seconds followed by alcohol lavage sterile dressing gave instructions on soaks

## 2021-04-09 ENCOUNTER — Other Ambulatory Visit (HOSPITAL_BASED_OUTPATIENT_CLINIC_OR_DEPARTMENT_OTHER): Payer: Self-pay

## 2021-04-10 ENCOUNTER — Other Ambulatory Visit (HOSPITAL_BASED_OUTPATIENT_CLINIC_OR_DEPARTMENT_OTHER): Payer: Self-pay

## 2021-04-11 ENCOUNTER — Other Ambulatory Visit (HOSPITAL_BASED_OUTPATIENT_CLINIC_OR_DEPARTMENT_OTHER): Payer: Self-pay

## 2021-04-11 MED ORDER — HYDROCHLOROTHIAZIDE 12.5 MG PO CAPS
ORAL_CAPSULE | ORAL | 0 refills | Status: DC
  Filled 2021-04-11: qty 90, 90d supply, fill #0

## 2021-04-11 MED ORDER — AMLODIPINE BESYLATE 5 MG PO TABS
5.0000 mg | ORAL_TABLET | Freq: Every day | ORAL | 0 refills | Status: DC
  Filled 2021-04-11: qty 90, 90d supply, fill #0

## 2021-05-07 ENCOUNTER — Other Ambulatory Visit (HOSPITAL_BASED_OUTPATIENT_CLINIC_OR_DEPARTMENT_OTHER): Payer: Self-pay

## 2021-05-09 ENCOUNTER — Other Ambulatory Visit (HOSPITAL_BASED_OUTPATIENT_CLINIC_OR_DEPARTMENT_OTHER): Payer: Self-pay

## 2021-05-09 MED ORDER — ROSUVASTATIN CALCIUM 10 MG PO TABS
10.0000 mg | ORAL_TABLET | Freq: Every day | ORAL | 1 refills | Status: DC
  Filled 2021-05-09: qty 90, 90d supply, fill #0
  Filled 2021-08-09: qty 90, 90d supply, fill #1

## 2021-05-09 MED ORDER — METFORMIN HCL ER 500 MG PO TB24
ORAL_TABLET | ORAL | 1 refills | Status: DC
  Filled 2021-05-09: qty 90, 90d supply, fill #0
  Filled 2021-08-09: qty 90, 90d supply, fill #1

## 2021-05-26 ENCOUNTER — Emergency Department (HOSPITAL_BASED_OUTPATIENT_CLINIC_OR_DEPARTMENT_OTHER)
Admit: 2021-05-26 | Discharge: 2021-05-26 | Disposition: A | Discharge status: Home / Self Care | Payer: 59 | Attending: Emergency Medicine | Admitting: Emergency Medicine

## 2021-05-26 ENCOUNTER — Other Ambulatory Visit: Payer: Self-pay

## 2021-05-26 ENCOUNTER — Emergency Department (HOSPITAL_COMMUNITY)
Admission: EM | Admit: 2021-05-26 | Discharge: 2021-05-26 | Disposition: A | Discharge status: Home / Self Care | Payer: 59 | Attending: Emergency Medicine | Admitting: Emergency Medicine

## 2021-05-26 ENCOUNTER — Emergency Department (HOSPITAL_COMMUNITY): Payer: 59

## 2021-05-26 DIAGNOSIS — I10 Essential (primary) hypertension: Secondary | ICD-10-CM | POA: Diagnosis not present

## 2021-05-26 DIAGNOSIS — Z7984 Long term (current) use of oral hypoglycemic drugs: Secondary | ICD-10-CM | POA: Diagnosis not present

## 2021-05-26 DIAGNOSIS — M79605 Pain in left leg: Secondary | ICD-10-CM | POA: Diagnosis not present

## 2021-05-26 DIAGNOSIS — E119 Type 2 diabetes mellitus without complications: Secondary | ICD-10-CM | POA: Diagnosis not present

## 2021-05-26 DIAGNOSIS — I4891 Unspecified atrial fibrillation: Secondary | ICD-10-CM | POA: Insufficient documentation

## 2021-05-26 DIAGNOSIS — Z7901 Long term (current) use of anticoagulants: Secondary | ICD-10-CM | POA: Insufficient documentation

## 2021-05-26 DIAGNOSIS — Z79899 Other long term (current) drug therapy: Secondary | ICD-10-CM | POA: Insufficient documentation

## 2021-05-26 DIAGNOSIS — R002 Palpitations: Secondary | ICD-10-CM | POA: Diagnosis present

## 2021-05-26 LAB — MAGNESIUM: Magnesium: 2.1 mg/dL (ref 1.7–2.4)

## 2021-05-26 LAB — COMPREHENSIVE METABOLIC PANEL
ALT: 21 U/L (ref 0–44)
AST: 22 U/L (ref 15–41)
Albumin: 3.8 g/dL (ref 3.5–5.0)
Alkaline Phosphatase: 113 U/L (ref 38–126)
Anion gap: 10 (ref 5–15)
BUN: 10 mg/dL (ref 6–20)
CO2: 24 mmol/L (ref 22–32)
Calcium: 8.9 mg/dL (ref 8.9–10.3)
Chloride: 104 mmol/L (ref 98–111)
Creatinine, Ser: 0.78 mg/dL (ref 0.44–1.00)
GFR, Estimated: >60 mL/min (ref >60)
Glucose, Bld: 113 mg/dL — ABNORMAL HIGH (ref 70–99)
Potassium: 3.7 mmol/L (ref 3.5–5.1)
Sodium: 138 mmol/L (ref 135–145)
Total Bilirubin: 1.4 mg/dL — ABNORMAL HIGH (ref 0.3–1.2)
Total Protein: 7.2 g/dL (ref 6.5–8.1)

## 2021-05-26 LAB — CBC WITH DIFFERENTIAL/PLATELET
Abs Immature Granulocytes: 0.02 10*3/uL (ref 0.00–0.07)
Basophils Absolute: 0.1 10*3/uL (ref 0.0–0.1)
Basophils Relative: 1 %
Eosinophils Absolute: 0.1 10*3/uL (ref 0.0–0.5)
Eosinophils Relative: 1 %
HCT: 41.9 % (ref 36.0–46.0)
Hemoglobin: 14.2 g/dL (ref 12.0–15.0)
Immature Granulocytes: 0 %
Lymphocytes Relative: 27 %
Lymphs Abs: 2.3 10*3/uL (ref 0.7–4.0)
MCH: 29 pg (ref 26.0–34.0)
MCHC: 33.9 g/dL (ref 30.0–36.0)
MCV: 85.7 fL (ref 80.0–100.0)
Monocytes Absolute: 0.5 10*3/uL (ref 0.1–1.0)
Monocytes Relative: 6 %
Neutro Abs: 5.5 10*3/uL (ref 1.7–7.7)
Neutrophils Relative %: 65 %
Platelets: 384 10*3/uL (ref 150–400)
RBC: 4.89 MIL/uL (ref 3.87–5.11)
RDW: 14.3 % (ref 11.5–15.5)
WBC: 8.5 10*3/uL (ref 4.0–10.5)
nRBC: 0 % (ref 0.0–0.2)

## 2021-05-26 LAB — TROPONIN I (HIGH SENSITIVITY)
Troponin I (High Sensitivity): 5 ng/L (ref <18)
Troponin I (High Sensitivity): 6 ng/L (ref <18)

## 2021-05-26 LAB — D-DIMER, QUANTITATIVE: D-Dimer, Quant: 0.48 ug/mL-FEU (ref 0.00–0.50)

## 2021-05-26 LAB — TSH: TSH: 0.7 u[IU]/mL (ref 0.350–4.500)

## 2021-05-26 MED ORDER — APIXABAN 5 MG PO TABS
5.0000 mg | ORAL_TABLET | Freq: Two times a day (BID) | ORAL | Status: DC
  Administered 2021-05-26: 5 mg via ORAL
  Filled 2021-05-26: qty 1

## 2021-05-26 MED ORDER — APIXABAN 5 MG PO TABS
5.0000 mg | ORAL_TABLET | Freq: Two times a day (BID) | ORAL | 0 refills | Status: DC
  Filled 2021-05-26: qty 60, 30d supply, fill #0

## 2021-05-26 NOTE — Progress Notes (Signed)
Left lower extremity venous duplex has been completed. ?Preliminary results can be found in CV Proc through chart review.  ?Results were given to Dr. Roslynn Amble. ? ?05/26/21 6:17 PM ?Carlos Levering RVT   ?

## 2021-05-26 NOTE — ED Notes (Signed)
Patient transported to X-ray 

## 2021-05-26 NOTE — Discharge Instructions (Addendum)
Follow up with cardiology. Call their number tomorrow morning to request appointment. Come back to ER if you develop chest pain, shortness of breath or other new concerning symtpom. ? ?Information on my medicine - ELIQUIS? (apixaban) ? ?This medication education was reviewed with me or my healthcare representative as part of my discharge preparation.   ? ?Why was Eliquis? prescribed for you? ?Eliquis? was prescribed for you to reduce the risk of forming blood clots that can cause a stroke if you have a medical condition called atrial fibrillation (a type of irregular heartbeat) OR to reduce the risk of a blood clots forming after orthopedic surgery. ? ?What do You need to know about Eliquis? ? ?Take your Eliquis? TWICE DAILY - one tablet in the morning and one tablet in the evening with or without food.  It would be best to take the doses about the same time each day. ? ?If you have difficulty swallowing the tablet whole please discuss with your pharmacist how to take the medication safely. ? ?Take Eliquis? exactly as prescribed by your doctor and DO NOT stop taking Eliquis? without talking to the doctor who prescribed the medication.  Stopping may increase your risk of developing a new clot or stroke.  Refill your prescription before you run out. ? ?After discharge, you should have regular check-up appointments with your healthcare provider that is prescribing your Eliquis?.  In the future your dose may need to be changed if your kidney function or weight changes by a significant amount or as you get older. ? ?What do you do if you miss a dose? ?If you miss a dose, take it as soon as you remember on the same day and resume taking twice daily.  Do not take more than one dose of ELIQUIS at the same time. ? ?Important Safety Information ?A possible side effect of Eliquis? is bleeding. You should call your healthcare provider right away if you experience any of the following: ?Bleeding from an injury or your nose that  does not stop. ?Unusual colored urine (red or dark brown) or unusual colored stools (red or black). ?Unusual bruising for unknown reasons. ?A serious fall or if you hit your head (even if there is no bleeding). ? ?Some medicines may interact with Eliquis? and might increase your risk of bleeding or clotting while on Eliquis?Marland Kitchen To help avoid this, consult your healthcare provider or pharmacist prior to using any new prescription or non-prescription medications, including herbals, vitamins, non-steroidal anti-inflammatory drugs (NSAIDs) and supplements. ? ?This website has more information on Eliquis? (apixaban): http://www.eliquis.com/eliquis/home ?  ?

## 2021-05-26 NOTE — ED Triage Notes (Addendum)
Was at pcp office for a med check and states that she has had palpitations off and on for "awhile" Denies CP. PCP set d/t new onset afib and further workup. Pt states when she has palpitations that it is hard to get a good deep breath ?

## 2021-05-26 NOTE — ED Provider Notes (Signed)
?[facility] ?Provider Note ? ? ?CSN: [id number] ?Arrival date & time: 05/26/21  1537 ? ?  ? ?History ? ?Chief Complaint  ?Patient presents with  ? Palpitations  ?  Ref from PCP office.   ? ? ?Janet [name] is a 56 y.o. female.  Patient reports that she was sent to ER due to new onset atrial fibrillation.  Over the last month or so she has been been having some intermittent palpitations, seems to come and go at random, has had some general fatigue and feeling a little bit short of breath at times.  Currently she does not feel short of breath and does not have any palpitations.  She noted slight pain in her left leg over the past few days but currently does not have pain in her leg.  No trauma.  No prior history of atrial fibrillation. ? ?Patient is not currently on anticoagulation.  She denies any recent major trauma, no recent major surgeries, no prior history of any brain bleeding event or GI bleeding event. ? ?In care everywhere, patient had visit with [facility] physician, Dr. Ronalee Red, visit diagnoses include atrial fibrillation, atrial flutter. ? ?HPI ? ?  ? ?Home Medications ?Prior to Admission medications   ?Medication Sig Start Date End Date Taking? Authorizing Provider  ?apixaban (ELIQUIS) 5 MG TABS tablet Take 1 tablet (5 mg total) by mouth 2 (two) times daily. 05/26/21 06/26/21 Yes Chrystel Barefield, Ellwood Dense, MD  ?amLODipine (NORVASC) 5 MG tablet Take 1 tablet by mouth daily. 11/11/18   [provider]  ?amLODipine (NORVASC) 5 MG tablet TAKE 1 TABLET BY MOUTH ONCE A DAY 04/11/21     ?hydrochlorothiazide (MICROZIDE) 12.5 MG capsule TAKE 1 CAPSULE BY MOUTH EVERY MORNING 06/19/19 12/31/20  Vernie Shanks, MD  ?hydrochlorothiazide (MICROZIDE) 12.5 MG capsule TAKE 1 CAPSULE BY MOUTH IN THE MORNING ONCE A DAY 04/11/21     ?metFORMIN (GLUCOPHAGE-XR) 500 MG 24 hr tablet Take 1 tablet (500 mg total) by mouth at bedtime. 05/09/21     ?metoprolol succinate (TOPROL-XL) 100 MG 24 hr tablet  TAKE 1 TABLET BY MOUTH ONCE A DAY 01/03/21     ?metoprolol succinate (TOPROL-XL) 50 MG 24 hr tablet Take 1 tablet (50 mg total) by mouth daily. Take with or immediately following a meal. ?Patient taking differently: Take 100 mg by mouth daily. Take with or immediately following a meal. 02/19/17   Tat, Shanon Brow, MD  ?rosuvastatin (CRESTOR) 10 MG tablet Take 1 tablet by mouth daily. 09/20/18   [provider]  ?rosuvastatin (CRESTOR) 10 MG tablet Take 1 tablet by mouth once daily 05/09/21     ?   ? ?Allergies    ?Penicillins   ? ?Review of Systems   ?Review of Systems  ?Constitutional:  Negative for chills and fever.  ?HENT:  Negative for ear pain and sore throat.   ?Eyes:  Negative for pain and visual disturbance.  ?Respiratory:  Positive for chest tightness and shortness of breath. Negative for cough.   ?Cardiovascular:  Positive for chest pain and palpitations.  ?Gastrointestinal:  Negative for abdominal pain and vomiting.  ?Genitourinary:  Negative for dysuria and hematuria.  ?Musculoskeletal:  Negative for arthralgias and back pain.  ?Skin:  Negative for color change and rash.  ?Neurological:  Negative for seizures and syncope.  ?All other systems reviewed and are negative. ? ?Physical Exam ?Updated Vital Signs ?BP 126/82 (BP Location: Right Arm)   Pulse 88   Temp 98.2 ?F (36.8 ?  C) (Oral)   Resp 18   Ht '5\' 4"'$  (1.626 m)   Wt 111.1 kg   LMP 07/22/2015   SpO2 98%   BMI 42.05 kg/m?  ?Physical Exam ?Vitals and nursing note reviewed.  ?Constitutional:   ?   General: She is not in acute distress. ?   Appearance: She is well-developed.  ?HENT:  ?   Head: Normocephalic and atraumatic.  ?Eyes:  ?   Conjunctiva/sclera: Conjunctivae normal.  ?Cardiovascular:  ?   Rate and Rhythm: Normal rate. Rhythm irregular.  ?   Pulses: Normal pulses.  ?   Heart sounds: No murmur heard. ?Pulmonary:  ?   Effort: Pulmonary effort is normal. No respiratory distress.  ?   Breath sounds: Normal breath sounds.  ?Abdominal:  ?    Palpations: Abdomen is soft.  ?   Tenderness: There is no abdominal tenderness.  ?Musculoskeletal:     ?   General: No swelling.  ?   Cervical back: Neck supple.  ?Skin: ?   General: Skin is warm and dry.  ?   Capillary Refill: Capillary refill takes less than 2 seconds.  ?Neurological:  ?   Mental Status: She is alert.  ?Psychiatric:     ?   Mood and Affect: Mood normal.  ? ? ?ED Results / Procedures / Treatments   ?Labs ?(all labs ordered are listed, but only abnormal results are displayed) ?Labs Reviewed  ?COMPREHENSIVE METABOLIC PANEL - Abnormal; Notable for the following components:  ?    Result Value  ? Glucose, Bld 113 (*)   ? Total Bilirubin 1.4 (*)   ? All other components within normal limits  ?CBC WITH DIFFERENTIAL/PLATELET  ?MAGNESIUM  ?D-DIMER, QUANTITATIVE  ?TSH  ?TROPONIN I (HIGH SENSITIVITY)  ?TROPONIN I (HIGH SENSITIVITY)  ? ? ?EKG ?EKG Interpretation ? ?Date/Time:  Thursday May 26 2021 15:41:03 EDT ?Ventricular Rate:  89 ?PR Interval:    ?QRS Duration: 86 ?QT Interval:  378 ?QTC Calculation: 460 ?R Axis:   -3 ?Text Interpretation: Atrial fibrillation Confirmed by Madalyn Rob [phone]) on 05/26/2021 6:28:12 PM ? ?Radiology ?DG Chest 2 View ? ?Result Date: 05/26/2021 ?CLINICAL DATA:  Chest pain, palpitations, new diagnosis atrial fibrillation EXAM: CHEST - 2 VIEW COMPARISON:  None FINDINGS: Normal heart size, mediastinal contours, and pulmonary vascularity. Lungs clear. No pulmonary infiltrate, pleural effusion, or pneumothorax. Osseous structures unremarkable. IMPRESSION: No acute abnormalities. Electronically Signed   By: Lavonia Dana M.D.   On: 05/26/2021 18:59  ? ?VAS [place] LOWER EXTREMITY VENOUS (DVT) (7a-7p) ? ?Result Date: 05/27/2021 ? Lower Venous DVT Study Patient Name:  Janet [name]  Date of Exam:   05/26/2021 Medical Rec #: [id number]           Accession #:    [id number] Date of Birth: Jul 01, 1965           Patient Gender: F Patient Age:   89 years Exam Location:  [facility]  Procedure:      VAS [place] LOWER EXTREMITY VENOUS (DVT) Referring Phys: Madalyn Rob --------------------------------------------------------------------------------  Indications: Pain.  Risk Factors: None identified. Limitations: Poor ultrasound/tissue interface. Comparison Study: No prior studies. Performing Technologist: Oliver Hum RVT  Examination Guidelines: A complete evaluation includes B-mode imaging, spectral Doppler, color Doppler, and power Doppler as needed of all accessible portions of each vessel. Bilateral testing is considered an integral part of a complete examination. Limited examinations for reoccurring indications may be performed as noted. The reflux portion of the exam is performed with  the patient in reverse Trendelenburg.  +-----+---------------+---------+-----------+----------+--------------+ RIGHTCompressibilityPhasicitySpontaneityPropertiesThrombus Aging +-----+---------------+---------+-----------+----------+--------------+ CFV  Full           Yes      Yes                                 +-----+---------------+---------+-----------+----------+--------------+   +---------+---------------+---------+-----------+----------+--------------+ LEFT     CompressibilityPhasicitySpontaneityPropertiesThrombus Aging +---------+---------------+---------+-----------+----------+--------------+ CFV      Full           Yes      Yes                                 +---------+---------------+---------+-----------+----------+--------------+ SFJ      Full                                                        +---------+---------------+---------+-----------+----------+--------------+ FV Prox  Full                                                        +---------+---------------+---------+-----------+----------+--------------+ FV Mid   Full                                                         +---------+---------------+---------+-----------+----------+--------------+ FV DistalFull                                                        +---------+---------------+---------+-----------+----------+--------------+ PFV      Full                                                        +---------+---------------+---------+----------

## 2021-05-27 ENCOUNTER — Other Ambulatory Visit (HOSPITAL_BASED_OUTPATIENT_CLINIC_OR_DEPARTMENT_OTHER): Payer: Self-pay

## 2021-05-27 ENCOUNTER — Telehealth (HOSPITAL_BASED_OUTPATIENT_CLINIC_OR_DEPARTMENT_OTHER): Payer: Self-pay | Admitting: Emergency Medicine

## 2021-05-27 NOTE — Telephone Encounter (Signed)
Encounter created to place formal afib clinic referral order.  ?

## 2021-06-02 ENCOUNTER — Inpatient Hospital Stay (HOSPITAL_COMMUNITY): Admission: RE | Admit: 2021-06-02 | Payer: 59 | Source: Ambulatory Visit

## 2021-06-02 ENCOUNTER — Encounter (HOSPITAL_COMMUNITY): Payer: Self-pay | Admitting: Nurse Practitioner

## 2021-06-02 ENCOUNTER — Other Ambulatory Visit (HOSPITAL_BASED_OUTPATIENT_CLINIC_OR_DEPARTMENT_OTHER): Payer: Self-pay

## 2021-06-02 ENCOUNTER — Ambulatory Visit (HOSPITAL_COMMUNITY)
Admission: RE | Admit: 2021-06-02 | Discharge: 2021-06-02 | Disposition: A | Discharge status: Home / Self Care | Payer: Managed Care, Other (non HMO) | Source: Ambulatory Visit | Attending: Nurse Practitioner | Admitting: Nurse Practitioner

## 2021-06-02 VITALS — BP 140/76 | HR 61 | Ht 64.0 in | Wt 245.2 lb

## 2021-06-02 DIAGNOSIS — I4891 Unspecified atrial fibrillation: Secondary | ICD-10-CM | POA: Diagnosis present

## 2021-06-02 DIAGNOSIS — I10 Essential (primary) hypertension: Secondary | ICD-10-CM | POA: Diagnosis not present

## 2021-06-02 DIAGNOSIS — Z7901 Long term (current) use of anticoagulants: Secondary | ICD-10-CM | POA: Diagnosis not present

## 2021-06-02 DIAGNOSIS — E119 Type 2 diabetes mellitus without complications: Secondary | ICD-10-CM | POA: Insufficient documentation

## 2021-06-02 DIAGNOSIS — I480 Paroxysmal atrial fibrillation: Secondary | ICD-10-CM | POA: Diagnosis not present

## 2021-06-02 DIAGNOSIS — D6869 Other thrombophilia: Secondary | ICD-10-CM

## 2021-06-02 DIAGNOSIS — G4730 Sleep apnea, unspecified: Secondary | ICD-10-CM | POA: Insufficient documentation

## 2021-06-02 DIAGNOSIS — Z79899 Other long term (current) drug therapy: Secondary | ICD-10-CM | POA: Insufficient documentation

## 2021-06-02 MED ORDER — DILTIAZEM HCL 30 MG PO TABS
ORAL_TABLET | ORAL | 1 refills | Status: AC
  Filled 2021-06-02: qty 30, 30d supply, fill #0

## 2021-06-02 MED ORDER — APIXABAN 5 MG PO TABS
5.0000 mg | ORAL_TABLET | Freq: Two times a day (BID) | ORAL | 3 refills | Status: DC
  Filled 2021-06-02 – 2021-06-23 (×2): qty 60, 30d supply, fill #0
  Filled 2021-07-15 – 2021-07-19 (×2): qty 60, 30d supply, fill #1
  Filled 2021-08-17: qty 60, 30d supply, fill #2
  Filled 2021-09-23: qty 60, 30d supply, fill #3

## 2021-06-02 MED ORDER — METOPROLOL SUCCINATE ER 50 MG PO TB24
50.0000 mg | ORAL_TABLET | Freq: Every day | ORAL | Status: DC
Start: 2021-06-02 — End: 2023-01-10

## 2021-06-02 NOTE — Patient Instructions (Signed)
Cardizem 30mg -- take 1 tablet every 4 hours AS NEEDED for heart rate >100 as long as top number of blood pressure >100.  
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
## 2021-06-17 ENCOUNTER — Ambulatory Visit (HOSPITAL_COMMUNITY)
Admission: RE | Admit: 2021-06-17 | Discharge: 2021-06-17 | Disposition: A | Discharge status: Home / Self Care | Payer: 59 | Source: Ambulatory Visit | Attending: Nurse Practitioner | Admitting: Nurse Practitioner

## 2021-06-17 DIAGNOSIS — I119 Hypertensive heart disease without heart failure: Secondary | ICD-10-CM | POA: Insufficient documentation

## 2021-06-17 DIAGNOSIS — E785 Hyperlipidemia, unspecified: Secondary | ICD-10-CM | POA: Insufficient documentation

## 2021-06-17 DIAGNOSIS — R079 Chest pain, unspecified: Secondary | ICD-10-CM | POA: Diagnosis not present

## 2021-06-17 DIAGNOSIS — G4730 Sleep apnea, unspecified: Secondary | ICD-10-CM | POA: Insufficient documentation

## 2021-06-17 DIAGNOSIS — R9431 Abnormal electrocardiogram [ECG] [EKG]: Secondary | ICD-10-CM | POA: Diagnosis not present

## 2021-06-17 DIAGNOSIS — I4891 Unspecified atrial fibrillation: Secondary | ICD-10-CM

## 2021-06-17 DIAGNOSIS — I480 Paroxysmal atrial fibrillation: Secondary | ICD-10-CM | POA: Insufficient documentation

## 2021-06-17 LAB — ECHOCARDIOGRAM COMPLETE
Area-P 1/2: 3.99 cm2
Calc EF: 59.4 %
S' Lateral: 3.05 cm
Single Plane A2C EF: 59.1 %
Single Plane A4C EF: 58.1 %

## 2021-06-17 NOTE — Progress Notes (Signed)
?  Echocardiogram ?2D Echocardiogram has been performed. ? ?Janet [name] ?06/17/2021, 8:36 AM ?

## 2021-06-23 ENCOUNTER — Other Ambulatory Visit (HOSPITAL_BASED_OUTPATIENT_CLINIC_OR_DEPARTMENT_OTHER): Payer: Self-pay

## 2021-06-23 ENCOUNTER — Encounter (HOSPITAL_COMMUNITY): Payer: Self-pay | Admitting: *Deleted

## 2021-06-29 ENCOUNTER — Ambulatory Visit (HOSPITAL_COMMUNITY): Payer: 59 | Admitting: Nurse Practitioner

## 2021-06-30 ENCOUNTER — Other Ambulatory Visit (HOSPITAL_BASED_OUTPATIENT_CLINIC_OR_DEPARTMENT_OTHER): Payer: Self-pay

## 2021-07-01 ENCOUNTER — Other Ambulatory Visit (HOSPITAL_BASED_OUTPATIENT_CLINIC_OR_DEPARTMENT_OTHER): Payer: Self-pay

## 2021-07-01 MED ORDER — AMLODIPINE BESYLATE 5 MG PO TABS
5.0000 mg | ORAL_TABLET | Freq: Every day | ORAL | 0 refills | Status: DC
  Filled 2021-07-01: qty 90, 90d supply, fill #0

## 2021-07-05 ENCOUNTER — Ambulatory Visit (HOSPITAL_COMMUNITY)
Admission: RE | Admit: 2021-07-05 | Discharge: 2021-07-05 | Disposition: A | Discharge status: Home / Self Care | Payer: 59 | Source: Ambulatory Visit | Attending: Nurse Practitioner | Admitting: Nurse Practitioner

## 2021-07-05 ENCOUNTER — Encounter (HOSPITAL_COMMUNITY): Payer: Self-pay | Admitting: Nurse Practitioner

## 2021-07-05 VITALS — BP 130/80 | HR 61 | Ht 64.0 in | Wt 245.0 lb

## 2021-07-05 DIAGNOSIS — Z7901 Long term (current) use of anticoagulants: Secondary | ICD-10-CM | POA: Diagnosis not present

## 2021-07-05 DIAGNOSIS — E119 Type 2 diabetes mellitus without complications: Secondary | ICD-10-CM | POA: Diagnosis not present

## 2021-07-05 DIAGNOSIS — I10 Essential (primary) hypertension: Secondary | ICD-10-CM | POA: Insufficient documentation

## 2021-07-05 DIAGNOSIS — Z79899 Other long term (current) drug therapy: Secondary | ICD-10-CM | POA: Diagnosis not present

## 2021-07-05 DIAGNOSIS — I4891 Unspecified atrial fibrillation: Secondary | ICD-10-CM | POA: Insufficient documentation

## 2021-07-05 DIAGNOSIS — D6869 Other thrombophilia: Secondary | ICD-10-CM | POA: Diagnosis not present

## 2021-07-05 LAB — BASIC METABOLIC PANEL
Anion gap: 9 (ref 5–15)
BUN: 11 mg/dL (ref 6–20)
CO2: 28 mmol/L (ref 22–32)
Calcium: 9.1 mg/dL (ref 8.9–10.3)
Chloride: 102 mmol/L (ref 98–111)
Creatinine, Ser: 1.05 mg/dL — ABNORMAL HIGH (ref 0.44–1.00)
GFR, Estimated: >60 mL/min (ref >60)
Glucose, Bld: 166 mg/dL — ABNORMAL HIGH (ref 70–99)
Potassium: 3.4 mmol/L — ABNORMAL LOW (ref 3.5–5.1)
Sodium: 139 mmol/L (ref 135–145)

## 2021-07-05 LAB — CBC
HCT: 39.1 % (ref 36.0–46.0)
Hemoglobin: 12.9 g/dL (ref 12.0–15.0)
MCH: 28.9 pg (ref 26.0–34.0)
MCHC: 33 g/dL (ref 30.0–36.0)
MCV: 87.5 fL (ref 80.0–100.0)
Platelets: 344 10*3/uL (ref 150–400)
RBC: 4.47 MIL/uL (ref 3.87–5.11)
RDW: 14.1 % (ref 11.5–15.5)
WBC: 6.9 10*3/uL (ref 4.0–10.5)
nRBC: 0 % (ref 0.0–0.2)
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
## 2021-07-07 ENCOUNTER — Other Ambulatory Visit (HOSPITAL_BASED_OUTPATIENT_CLINIC_OR_DEPARTMENT_OTHER): Payer: Self-pay

## 2021-07-07 ENCOUNTER — Other Ambulatory Visit (HOSPITAL_COMMUNITY): Payer: Self-pay | Admitting: *Deleted

## 2021-07-07 DIAGNOSIS — I480 Paroxysmal atrial fibrillation: Secondary | ICD-10-CM

## 2021-07-07 MED ORDER — POTASSIUM CHLORIDE CRYS ER 20 MEQ PO TBCR
20.0000 meq | EXTENDED_RELEASE_TABLET | Freq: Every day | ORAL | 1 refills | Status: DC
  Filled 2021-07-07: qty 30, 30d supply, fill #0
  Filled 2021-08-09: qty 30, 30d supply, fill #1

## 2021-07-15 ENCOUNTER — Other Ambulatory Visit (HOSPITAL_BASED_OUTPATIENT_CLINIC_OR_DEPARTMENT_OTHER): Payer: Self-pay

## 2021-07-15 MED ORDER — HYDROCHLOROTHIAZIDE 12.5 MG PO CAPS
ORAL_CAPSULE | ORAL | 0 refills | Status: DC
  Filled 2021-07-15: qty 90, 90d supply, fill #0

## 2021-07-19 ENCOUNTER — Other Ambulatory Visit (HOSPITAL_BASED_OUTPATIENT_CLINIC_OR_DEPARTMENT_OTHER): Payer: Self-pay

## 2021-07-21 ENCOUNTER — Ambulatory Visit (HOSPITAL_COMMUNITY)
Admission: RE | Admit: 2021-07-21 | Discharge: 2021-07-21 | Disposition: A | Discharge status: Home / Self Care | Payer: 59 | Source: Ambulatory Visit | Attending: Physician Assistant | Admitting: Physician Assistant

## 2021-07-21 DIAGNOSIS — I4891 Unspecified atrial fibrillation: Secondary | ICD-10-CM | POA: Diagnosis not present

## 2021-07-21 LAB — BASIC METABOLIC PANEL
Anion gap: 8 (ref 5–15)
BUN: 9 mg/dL (ref 6–20)
CO2: 27 mmol/L (ref 22–32)
Calcium: 9.3 mg/dL (ref 8.9–10.3)
Chloride: 102 mmol/L (ref 98–111)
Creatinine, Ser: 0.83 mg/dL (ref 0.44–1.00)
GFR, Estimated: >60 mL/min (ref >60)
Glucose, Bld: 107 mg/dL — ABNORMAL HIGH (ref 70–99)
Potassium: 4 mmol/L (ref 3.5–5.1)
Sodium: 137 mmol/L (ref 135–145)

## 2021-08-08 ENCOUNTER — Other Ambulatory Visit (HOSPITAL_BASED_OUTPATIENT_CLINIC_OR_DEPARTMENT_OTHER): Payer: Self-pay

## 2021-08-08 MED ORDER — METOPROLOL SUCCINATE ER 50 MG PO TB24
ORAL_TABLET | ORAL | 6 refills | Status: DC
  Filled 2021-08-08: qty 30, 30d supply, fill #0

## 2021-08-09 ENCOUNTER — Other Ambulatory Visit (HOSPITAL_BASED_OUTPATIENT_CLINIC_OR_DEPARTMENT_OTHER): Payer: Self-pay

## 2021-08-17 ENCOUNTER — Other Ambulatory Visit (HOSPITAL_BASED_OUTPATIENT_CLINIC_OR_DEPARTMENT_OTHER): Payer: Self-pay

## 2021-09-01 ENCOUNTER — Other Ambulatory Visit (HOSPITAL_COMMUNITY): Payer: Self-pay

## 2021-09-07 DIAGNOSIS — E559 Vitamin D deficiency, unspecified: Secondary | ICD-10-CM | POA: Diagnosis not present

## 2021-09-07 DIAGNOSIS — Z23 Encounter for immunization: Secondary | ICD-10-CM | POA: Diagnosis not present

## 2021-09-07 DIAGNOSIS — E7800 Pure hypercholesterolemia, unspecified: Secondary | ICD-10-CM | POA: Diagnosis not present

## 2021-09-07 DIAGNOSIS — E1169 Type 2 diabetes mellitus with other specified complication: Secondary | ICD-10-CM | POA: Diagnosis not present

## 2021-09-07 DIAGNOSIS — Z0000 Encounter for general adult medical examination without abnormal findings: Secondary | ICD-10-CM | POA: Diagnosis not present

## 2021-09-07 DIAGNOSIS — R002 Palpitations: Secondary | ICD-10-CM | POA: Diagnosis not present

## 2021-09-09 ENCOUNTER — Other Ambulatory Visit (HOSPITAL_BASED_OUTPATIENT_CLINIC_OR_DEPARTMENT_OTHER): Payer: Self-pay

## 2021-09-09 MED ORDER — OZEMPIC (0.25 OR 0.5 MG/DOSE) 2 MG/3ML ~~LOC~~ SOPN
PEN_INJECTOR | SUBCUTANEOUS | 0 refills | Status: DC
  Filled 2021-09-09: qty 3, 56d supply, fill #0
  Filled 2021-09-30: qty 3, 31d supply, fill #0

## 2021-09-23 ENCOUNTER — Other Ambulatory Visit (HOSPITAL_BASED_OUTPATIENT_CLINIC_OR_DEPARTMENT_OTHER): Payer: Self-pay

## 2021-09-30 ENCOUNTER — Other Ambulatory Visit (HOSPITAL_BASED_OUTPATIENT_CLINIC_OR_DEPARTMENT_OTHER): Payer: Self-pay

## 2021-09-30 ENCOUNTER — Other Ambulatory Visit (HOSPITAL_COMMUNITY): Payer: Self-pay | Admitting: Nurse Practitioner

## 2021-09-30 MED ORDER — HYDROCHLOROTHIAZIDE 12.5 MG PO CAPS
12.5000 mg | ORAL_CAPSULE | Freq: Every morning | ORAL | 3 refills | Status: DC
  Filled 2021-09-30: qty 90, 90d supply, fill #0

## 2021-09-30 MED ORDER — METFORMIN HCL ER 500 MG PO TB24
500.0000 mg | ORAL_TABLET | Freq: Every day | ORAL | 3 refills | Status: DC
  Filled 2021-09-30: qty 90, 90d supply, fill #0

## 2021-09-30 MED ORDER — ROSUVASTATIN CALCIUM 10 MG PO TABS
10.0000 mg | ORAL_TABLET | Freq: Every day | ORAL | 3 refills | Status: DC
  Filled 2021-09-30: qty 90, 90d supply, fill #0

## 2021-09-30 MED ORDER — AMLODIPINE BESYLATE 5 MG PO TABS
5.0000 mg | ORAL_TABLET | Freq: Every day | ORAL | 3 refills | Status: DC
  Filled 2021-09-30: qty 90, 90d supply, fill #0

## 2021-10-03 ENCOUNTER — Other Ambulatory Visit (HOSPITAL_BASED_OUTPATIENT_CLINIC_OR_DEPARTMENT_OTHER): Payer: Self-pay

## 2021-10-03 MED ORDER — METOPROLOL SUCCINATE ER 50 MG PO TB24
ORAL_TABLET | ORAL | 3 refills | Status: DC
  Filled 2021-10-03: qty 30, 30d supply, fill #0

## 2021-10-03 MED ORDER — APIXABAN 5 MG PO TABS
5.0000 mg | ORAL_TABLET | Freq: Two times a day (BID) | ORAL | 11 refills | Status: DC
  Filled 2021-10-03: qty 60, 30d supply, fill #0

## 2021-10-03 MED ORDER — POTASSIUM CHLORIDE CRYS ER 20 MEQ PO TBCR
20.0000 meq | EXTENDED_RELEASE_TABLET | Freq: Every day | ORAL | 6 refills | Status: DC
Start: 2021-10-03 — End: 2022-03-30
  Filled 2021-10-03: qty 30, 30d supply, fill #0

## 2021-10-04 ENCOUNTER — Other Ambulatory Visit: Payer: Self-pay | Admitting: Obstetrics & Gynecology

## 2021-10-04 ENCOUNTER — Other Ambulatory Visit (HOSPITAL_BASED_OUTPATIENT_CLINIC_OR_DEPARTMENT_OTHER): Payer: Self-pay

## 2021-10-04 DIAGNOSIS — Z1231 Encounter for screening mammogram for malignant neoplasm of breast: Secondary | ICD-10-CM

## 2021-10-04 MED ORDER — HYDROCHLOROTHIAZIDE 12.5 MG PO CAPS
12.5000 mg | ORAL_CAPSULE | Freq: Every morning | ORAL | 3 refills | Status: DC
  Filled 2021-10-04: qty 30, 30d supply, fill #0

## 2021-10-04 MED ORDER — AMLODIPINE BESYLATE 5 MG PO TABS
5.0000 mg | ORAL_TABLET | Freq: Every day | ORAL | 3 refills | Status: DC
  Filled 2021-10-04: qty 30, 30d supply, fill #0

## 2021-10-17 ENCOUNTER — Other Ambulatory Visit (HOSPITAL_COMMUNITY): Payer: Self-pay | Admitting: *Deleted

## 2021-10-17 MED ORDER — APIXABAN 5 MG PO TABS: 5.0000 mg | ORAL_TABLET | Freq: Two times a day (BID) | ORAL | 1 refills | Status: DC

## 2021-11-11 ENCOUNTER — Ambulatory Visit
Admission: RE | Admit: 2021-11-11 | Discharge: 2021-11-11 | Disposition: A | Discharge status: Home / Self Care | Payer: BC Managed Care – PPO | Source: Ambulatory Visit | Attending: Obstetrics & Gynecology | Admitting: Obstetrics & Gynecology

## 2021-11-11 DIAGNOSIS — Z1231 Encounter for screening mammogram for malignant neoplasm of breast: Secondary | ICD-10-CM | POA: Diagnosis not present

## 2021-12-22 ENCOUNTER — Other Ambulatory Visit (HOSPITAL_COMMUNITY): Payer: Self-pay | Admitting: *Deleted

## 2021-12-22 DIAGNOSIS — I480 Paroxysmal atrial fibrillation: Secondary | ICD-10-CM

## 2022-01-05 ENCOUNTER — Encounter: Payer: Self-pay | Admitting: Obstetrics & Gynecology

## 2022-01-05 ENCOUNTER — Ambulatory Visit (INDEPENDENT_AMBULATORY_CARE_PROVIDER_SITE_OTHER): Payer: BC Managed Care – PPO | Admitting: Obstetrics & Gynecology

## 2022-01-05 VITALS — BP 116/74 | HR 68 | Ht 63.75 in | Wt 245.0 lb

## 2022-01-05 DIAGNOSIS — Z90710 Acquired absence of both cervix and uterus: Secondary | ICD-10-CM

## 2022-01-05 DIAGNOSIS — Z01419 Encounter for gynecological examination (general) (routine) without abnormal findings: Secondary | ICD-10-CM

## 2022-01-05 DIAGNOSIS — Z780 Asymptomatic menopausal state: Secondary | ICD-10-CM

## 2022-01-05 NOTE — Progress Notes (Signed)
[place] 1965-02-26 [id number]   History:    56 y.o. G3P2A1L2 Divorced.  2 daughters doing well, both in [place] for studies, [place] is the next place for the oldest, stopping medical studies x 1 year.   RP:  Established patient presenting for annual gyn exam    HPI: Postmenopause, well on no HRT.  S/P Total Hysterectomy with unilateral Oophorectomy.  No current pelvic pain.  Abstinent. Last Pap 10/2017 Neg. No indication for a Pap at this time, repeat at 5 years. Urine and bowel movements normal.  Breasts normal.  Mammo Neg 10/2021.  Body mass index 42.38.  Will restart low calorie/carb diet and physical activities, less stress at work. 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 [place] 2020. Flu vaccine at work.   Past medical history,surgical history, family history and social history were all reviewed and documented in the EPIC chart.  Gynecologic History Patient's last menstrual period was 07/22/2015.  Obstetric History OB History  Gravida Para Term Preterm AB Living  '3 2 2   1 2  '$ SAB IAB Ectopic Multiple Live Births  1            # Outcome Date GA Lbr Len/2nd Weight Sex Delivery Anes PTL Lv  3 SAB           2 Term           1 Term              ROS: A ROS was performed and pertinent positives and negatives are included in the history. GENERAL: No fevers or chills. HEENT: No change in vision, no earache, sore throat or sinus congestion. NECK: No pain or stiffness. CARDIOVASCULAR: No chest pain or pressure. No palpitations. PULMONARY: No shortness of breath, cough or wheeze. GASTROINTESTINAL: No abdominal pain, nausea, vomiting or diarrhea, melena or bright red blood per rectum. GENITOURINARY: No urinary frequency, urgency, hesitancy or dysuria. MUSCULOSKELETAL: No joint or muscle pain, no back pain, no recent trauma. DERMATOLOGIC: No rash, no itching, no lesions. ENDOCRINE: No polyuria, polydipsia, no heat or cold intolerance. No recent change in weight. HEMATOLOGICAL: No  anemia or easy bruising or bleeding. NEUROLOGIC: No headache, seizures, numbness, tingling or weakness. PSYCHIATRIC: No depression, no loss of interest in normal activity or change in sleep pattern.     Exam:   BP 116/74   Pulse 68   Ht 5' 3.75" (1.619 m)   Wt 245 lb (111.1 kg)   LMP 07/22/2015   SpO2 97%   BMI 42.38 kg/m   Body mass index is 42.38 kg/m.  General appearance : Well developed well nourished female. No acute distress HEENT: Eyes: no retinal hemorrhage or exudates,  Neck supple, trachea midline, no carotid bruits, no thyroidmegaly Lungs: Clear to auscultation, no rhonchi or wheezes, or rib retractions  Heart: Regular rate and rhythm, no murmurs or gallops Breast:Examined in sitting and supine position were symmetrical in appearance, no palpable masses or tenderness,  no skin retraction, no nipple inversion, no nipple discharge, no skin discoloration, no axillary or supraclavicular lymphadenopathy Abdomen: no palpable masses or tenderness, no rebound or guarding Extremities: no edema or skin discoloration or tenderness  Pelvic: Vulva: Normal             Vagina: No gross lesions or discharge  Cervix/Uterus absent  Adnexa  Without masses or tenderness  Anus: Normal   Assessment/Plan:  56 y.o. female for annual exam   1. Well female exam with routine gynecological exam Postmenopause, well  on no HRT.  S/P Total Hysterectomy with unilateral Oophorectomy.  No current pelvic pain.  Abstinent. Last Pap 10/2017 Neg.  Will repeat Pap at 5 years. Urine and bowel movements normal.  Breasts normal.  Mammo Neg 10/2021.  Body mass index 42.38.  Will restart low calorie/carb diet and physical activities, less stress at work. 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 [place] 2020. Flu vaccine at work.  2. H/O total hysterectomy  3. Postmenopause Postmenopause, well on no HRT.  S/P Total Hysterectomy with unilateral Oophorectomy.  No current pelvic pain.  Abstinent.  4. Class 3 severe  obesity due to excess calories without serious comorbidity with body mass index (BMI) of 40.0 to 44.9 in adult [facility])  Will restart low calorie/carb diet and physical activities, less stress at work.  Princess Bruins MD, 3:44 PM 01/05/2022

## 2022-01-07 ENCOUNTER — Encounter: Payer: Self-pay | Admitting: Obstetrics & Gynecology

## 2022-02-07 DIAGNOSIS — E079 Disorder of thyroid, unspecified: Secondary | ICD-10-CM | POA: Diagnosis not present

## 2022-02-07 DIAGNOSIS — E1169 Type 2 diabetes mellitus with other specified complication: Secondary | ICD-10-CM | POA: Diagnosis not present

## 2022-02-07 DIAGNOSIS — R399 Unspecified symptoms and signs involving the genitourinary system: Secondary | ICD-10-CM | POA: Diagnosis not present

## 2022-02-07 DIAGNOSIS — R42 Dizziness and giddiness: Secondary | ICD-10-CM | POA: Diagnosis not present

## 2022-02-18 NOTE — Progress Notes (Unsigned)
Cardiology Office Note:    Date:  02/18/2022   ID:  Janet [name], DOB 02/08/1966, MRN [id number]  PCP:  [name], PA-C   [place] Providers Cardiologist:  Lenna Sciara, MD Referring MD: [name], NP   Chief Complaint/Reason for Referral: Paroxysmal atrial fibrillation  ASSESSMENT:    1. Paroxysmal atrial fibrillation (HCC)   2. Type 2 diabetes mellitus without complication, without long-term current use of insulin ([place])   3. Hypertension associated with diabetes ([place])   4. Hyperlipidemia associated with type 2 diabetes mellitus ([place])   5. BMI 40.0-44.9, adult ([place])     PLAN:    In order of problems listed above: 1.  Paroxysmal atrial fibrillation: Continue Eliquis and diltiazem. 2.  Type 2 diabetes: Continue Eliquis in lieu of aspirin, continue rosuvastatin, will stop hydrochlorothiazide and start losartan 12.5 mg daily and consider SGLT2 inhibitor in the future. 3.  Hypertension: Will make change to losartan as detailed above to enhance renal protection in a diabetic. 4.  Hyperlipidemia: Check lipid panel, LFTs, and LP(a) today.  Goal LDL is less than 70. 5.  Elevated BMI: Refer to pharmacy for recommendations.         {Are you ordering a CV Procedure (e.g. stress test, cath, DCCV, TEE, etc)?   Press F2        :[id number]}   Dispo:  No follow-ups on file.      Medication Adjustments/Labs and Tests Ordered: Current medicines are reviewed at length with the patient today.  Concerns regarding medicines are outlined above.  The following changes have been made:  {PLAN; NO CHANGE:13088:s}   Labs/tests ordered: No orders of the defined types were placed in this encounter.   Medication Changes: No orders of the defined types were placed in this encounter.    Current medicines are reviewed at length with the patient today.  The patient {ACTIONS; HAS/DOES NOT HAVE:19233} concerns regarding medicines.   History of Present Illness:     FOCUSED PROBLEM LIST:   1.  BMI 42 2.  Type 2 diabetes on metformin 3.  Hyperlipidemia 4.  Hypertension 5.  Paroxysmal atrial fibrillation on Eliquis with CV 2 score of 3  The patient is a 56 y.o. female with the indicated medical history here to establish general cardiovascular care.  The patient was followed by the atrial fibrillation clinic.       Previous Medical History:   Current Medications: No outpatient medications have been marked as taking for the 02/22/22 encounter (Office Visit) with [name], MD.     Allergies:    Penicillins   Social History:   Social History   Tobacco Use   Smoking status: Never   Smokeless tobacco: Never  Vaping Use   Vaping Use: Never used  Substance Use Topics   Alcohol use: No    Alcohol/week: 0.0 standard drinks of alcohol   Drug use: No     Family Hx: Family History  Problem Relation Age of Onset   Diabetes Mother    Depression Mother    Hypertension Father    Atrial fibrillation Father    Depression Father    Asthma Sister    Depression Sister    Hypertension Brother    Depression Brother    COPD Maternal Grandmother    Diabetes Maternal Grandfather    Diabetes Paternal Grandmother    Stroke Paternal Grandfather    Heart attack Neg Hx    Sudden death Neg Hx  Review of Systems:   Please see the history of present illness.    All other systems reviewed and are negative.     EKGs/Labs/Other Test Reviewed:    EKG:  EKG performed May 2023 that I personally reviewed demonstrates normal sinus rhythm; EKG performed today that I personally reviewed demonstrates ***.  Prior CV studies:  TTE 2023 1. Left ventricular ejection fraction, by estimation, is 60 to 65%. The  left ventricle has normal function. The left ventricle has no regional  wall motion abnormalities. There is mild left ventricular hypertrophy.  Left ventricular diastolic parameters  were normal. The average left ventricular global  longitudinal strain is  -23.0 %. The global longitudinal strain is normal.   2. Right ventricular systolic function is normal. The right ventricular  size is normal.   3. The mitral valve is normal in structure. No evidence of mitral valve  regurgitation.   4. The aortic valve is normal in structure. Aortic valve regurgitation is  not visualized.   5. The inferior vena cava is normal in size with greater than 50%  respiratory variability, suggesting right atrial pressure of 3 mmHg.   Other studies Reviewed: Review of the additional studies/records demonstrates: Patient CT scans do not demonstrate coronary artery calcification or aortic atherosclerosis  Recent Labs: 05/26/2021: ALT 21; Magnesium 2.1; TSH 0.700 07/05/2021: Hemoglobin 12.9; Platelets 344 07/21/2021: BUN 9; Creatinine, Ser 0.83; Potassium 4.0; Sodium 137   Recent Lipid Panel Lab Results  Component Value Date/Time   CHOL 167 02/16/2017 05:57 AM   TRIG 48 02/16/2017 05:57 AM   HDL 58 02/16/2017 05:57 AM   LDLCALC 99 02/16/2017 05:57 AM    Risk Assessment/Calculations:     [id number] Score = 3  {Click here to calculate score.  REFRESH note before signing. :1} This indicates a 3.2% annual risk of stroke. The patient's score is based upon: CHF History: 0 HTN History: 1 Diabetes History: 1 Stroke History: 0 Vascular Disease History: 0 Age Score: 0 Gender Score: 1   {This patient has a significant risk of stroke if diagnosed with atrial fibrillation.  Please consider VKA or DOAC agent for anticoagulation if the bleeding risk is acceptable.   You can also use the SmartPhrase [employer] for documentation.   :[id number]}      No BP recorded.  {Refresh Note OR Click here to enter BP  :1}***    Physical Exam:    VS:  LMP 07/22/2015    Wt Readings from Last 3 Encounters:  01/05/22 245 lb (111.1 kg)  07/05/21 245 lb (111.1 kg)  06/02/21 245 lb 3.2 oz (111.2 kg)    GENERAL:  No apparent distress, AOx3 HEENT:  No  carotid bruits, +2 carotid impulses, no scleral icterus CAR: RRR Irregular RR*** no murmurs***, gallops, rubs, or thrills RES:  Clear to auscultation bilaterally ABD:  Soft, nontender, nondistended, positive bowel sounds x 4 VASC:  +2 radial pulses, +2 carotid pulses, palpable pedal pulses NEURO:  CN 2-12 grossly intact; motor and sensory grossly intact PSYCH:  No active depression or anxiety EXT:  No edema, ecchymosis, or cyanosis  Signed, [name], MD  02/18/2022 9:17 AM    [address], [place], [place]  [address] Phone: [phone]; Fax: [phone]   Note:  This document was prepared using Dragon voice recognition software and may include unintentional dictation errors.

## 2022-02-22 ENCOUNTER — Encounter: Payer: Self-pay | Admitting: Internal Medicine

## 2022-02-22 ENCOUNTER — Ambulatory Visit: Payer: BC Managed Care – PPO | Attending: Internal Medicine | Admitting: Internal Medicine

## 2022-02-22 VITALS — BP 124/62 | HR 70 | Ht 64.0 in | Wt 248.4 lb

## 2022-02-22 DIAGNOSIS — E1159 Type 2 diabetes mellitus with other circulatory complications: Secondary | ICD-10-CM

## 2022-02-22 DIAGNOSIS — E785 Hyperlipidemia, unspecified: Secondary | ICD-10-CM

## 2022-02-22 DIAGNOSIS — I480 Paroxysmal atrial fibrillation: Secondary | ICD-10-CM | POA: Diagnosis not present

## 2022-02-22 DIAGNOSIS — Z6841 Body Mass Index (BMI) 40.0 and over, adult: Secondary | ICD-10-CM

## 2022-02-22 DIAGNOSIS — E119 Type 2 diabetes mellitus without complications: Secondary | ICD-10-CM | POA: Diagnosis not present

## 2022-02-22 DIAGNOSIS — I152 Hypertension secondary to endocrine disorders: Secondary | ICD-10-CM

## 2022-02-22 DIAGNOSIS — E1169 Type 2 diabetes mellitus with other specified complication: Secondary | ICD-10-CM | POA: Diagnosis not present

## 2022-02-22 MED ORDER — ROSUVASTATIN CALCIUM 20 MG PO TABS: 20.0000 mg | ORAL_TABLET | Freq: Every day | ORAL | 3 refills | Status: DC

## 2022-02-22 NOTE — Patient Instructions (Signed)
Medication Instructions:  Your physician has recommended you make the following change in your medication:  1.) change Crestor (rosuvastatin) to 20 mg - one tablet daily  *If you need a refill on your cardiac medications before your next appointment, please call your pharmacy*   Lab Work: Please return in 2 months for labs (lipids/liver/lp(a)  If you have labs (blood work) drawn today and your tests are completely normal, you will receive your results only by: [place] (if you have MyChart) OR A paper copy in the mail If you have any lab test that is abnormal or we need to change your treatment, we will call you to review the results.   Testing/Procedures: none   Follow-Up: At [facility], you and your health needs are our priority.  As part of our continuing mission to provide you with exceptional heart care, we have created designated Provider Care Teams.  These Care Teams include your primary Cardiologist (physician) and Advanced Practice Providers (APPs -  Physician Assistants and Nurse Practitioners) who all work together to provide you with the care you need, when you need it.   Your next appointment:   9 month(s)  The format for your next appointment:   In Person  Provider:   [name], MD      Important Information About Sugar

## 2022-03-30 ENCOUNTER — Ambulatory Visit: Payer: BC Managed Care – PPO | Attending: Cardiology | Admitting: Pharmacist

## 2022-03-30 ENCOUNTER — Encounter (HOSPITAL_COMMUNITY): Payer: Self-pay | Admitting: *Deleted

## 2022-03-30 ENCOUNTER — Ambulatory Visit: Payer: BC Managed Care – PPO

## 2022-03-30 DIAGNOSIS — E119 Type 2 diabetes mellitus without complications: Secondary | ICD-10-CM | POA: Insufficient documentation

## 2022-03-30 DIAGNOSIS — Z79899 Other long term (current) drug therapy: Secondary | ICD-10-CM | POA: Diagnosis not present

## 2022-03-30 DIAGNOSIS — I10 Essential (primary) hypertension: Secondary | ICD-10-CM | POA: Diagnosis not present

## 2022-03-30 MED ORDER — POTASSIUM CHLORIDE CRYS ER 20 MEQ PO TBCR: 20.0000 meq | EXTENDED_RELEASE_TABLET | Freq: Every day | ORAL | 3 refills | Status: DC

## 2022-03-30 NOTE — Assessment & Plan Note (Signed)
Assessment: A1c is 6.9 Patient is motivated to make nutritional and exercise changes She really wishes to be on as little medication as possible Technically GLP-1 for weight loss indication is after failed diet and exercise in which patient has not done any  Plan: Patient would like time to try to lose weight on her own which I think is very reasonable We discussed making sure that she as an exercise and makes time to do this We discussed barriers and the to exercise and how to avoid those Will start with walking and add in strength training at a later time She is already starting to make small changes with her nutrition I gave information on the company tactic functional nutrition
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
## 2022-03-30 NOTE — Assessment & Plan Note (Signed)
Assessment: Blood pressure is well-controlled on current regimen of amlodipine 5 mg daily hydrochlorothiazide 12.5 mg daily and metoprolol Although I most certainly see the possible benefits of changing to an ARB, patient's blood pressure is well-controlled on current regimen and she is hesitant to make changes. Losartan is not a very strong blood pressure lowering medication, therefore if a change was made I would probably only stop HCTZ and would continue amlodipine and metoprolol Plan: I have given patient an order to check a UACR.  If she does have any proteinuria I will for sure to make this change to an ARB.  If no protein is seen we discussed again with the patient if she would like to make a change.

## 2022-03-30 NOTE — Assessment & Plan Note (Signed)
Assessment: A1c 6.9 on metformin 500 mg daily A1c would be at goal per ADA AACE would say less than 6.5 We discussed SGLT2's, how they work, their benefit on kidney disease and heart failure We do not really know if SGLT2 prevent kidney disease or if they only slow the progression when CKD is already present  Plan: Patient does not wish to start at this time Continue to work on blood sugar control exercise and nutrition

## 2022-04-07 ENCOUNTER — Other Ambulatory Visit (HOSPITAL_COMMUNITY): Payer: Self-pay | Admitting: Internal Medicine

## 2022-04-07 NOTE — Telephone Encounter (Signed)
Prescription refill request for Eliquis received.  Indication: afib  Last office visit: [place], 02/22/2022 Scr: 0.83, 07/21/2021 Age: 57 yo  Weight: 112.7 kg   Refill sent.

## 2022-05-03 ENCOUNTER — Telehealth: Payer: Self-pay | Admitting: Pharmacist

## 2022-05-03 NOTE — Telephone Encounter (Signed)
Called pt to see how her lifestyle changes were going. She is working with [employer] through work- PT online-Gives her exercises to do for 20 min a day. Has been walking more. States she has a bad 2 weeks and her diet hasn't been great. Coming in for lipid and UACR tomorrow.

## 2022-05-04 ENCOUNTER — Ambulatory Visit: Payer: BC Managed Care – PPO | Attending: Internal Medicine

## 2022-05-04 DIAGNOSIS — E119 Type 2 diabetes mellitus without complications: Secondary | ICD-10-CM

## 2022-05-04 DIAGNOSIS — E1169 Type 2 diabetes mellitus with other specified complication: Secondary | ICD-10-CM

## 2022-05-04 DIAGNOSIS — E1159 Type 2 diabetes mellitus with other circulatory complications: Secondary | ICD-10-CM | POA: Diagnosis not present

## 2022-05-04 DIAGNOSIS — Z6841 Body Mass Index (BMI) 40.0 and over, adult: Secondary | ICD-10-CM

## 2022-05-04 DIAGNOSIS — I480 Paroxysmal atrial fibrillation: Secondary | ICD-10-CM | POA: Diagnosis not present

## 2022-05-04 DIAGNOSIS — I152 Hypertension secondary to endocrine disorders: Secondary | ICD-10-CM | POA: Diagnosis not present

## 2022-05-05 ENCOUNTER — Other Ambulatory Visit: Payer: Self-pay | Admitting: *Deleted

## 2022-05-05 DIAGNOSIS — E1169 Type 2 diabetes mellitus with other specified complication: Secondary | ICD-10-CM

## 2022-05-05 DIAGNOSIS — Z79899 Other long term (current) drug therapy: Secondary | ICD-10-CM

## 2022-05-05 LAB — LIPID PANEL
Chol/HDL Ratio: 2.6 ratio (ref 0.0–4.4)
Cholesterol, Total: 144 mg/dL (ref 100–199)
HDL: 55 mg/dL (ref >39)
LDL Chol Calc (NIH): 70 mg/dL (ref 0–99)
Triglycerides: 102 mg/dL (ref 0–149)
VLDL Cholesterol Cal: 19 mg/dL (ref 5–40)

## 2022-05-05 LAB — HEPATIC FUNCTION PANEL
ALT: 13 IU/L (ref 0–32)
AST: 16 IU/L (ref 0–40)
Albumin: 4.1 g/dL (ref 3.8–4.9)
Alkaline Phosphatase: 144 IU/L — ABNORMAL HIGH (ref 44–121)
Bilirubin Total: 0.3 mg/dL (ref 0.0–1.2)
Bilirubin, Direct: <0.1 mg/dL (ref 0.00–0.40)
Total Protein: 6.5 g/dL (ref 6.0–8.5)

## 2022-05-05 LAB — MICROALBUMIN / CREATININE URINE RATIO
Creatinine, Urine: 107.5 mg/dL
Microalb/Creat Ratio: <3 mg/g creat (ref 0–29)
Microalbumin, Urine: <3 ug/mL

## 2022-05-05 LAB — LIPOPROTEIN A (LPA): Lipoprotein (a): 230.7 nmol/L — ABNORMAL HIGH (ref <75.0)

## 2022-05-05 MED ORDER — ROSUVASTATIN CALCIUM 40 MG PO TABS: 40.0000 mg | ORAL_TABLET | Freq: Every day | ORAL | 3 refills | Status: DC

## 2022-05-05 NOTE — Progress Notes (Signed)
[name], MD 05/05/2022  2:10 PM EDT     Please increase patient's Crestor to 40 mg and check lipid panel and LFTs in 2 months.    Pt aware of the above orders.  RX sent, lab ordered and scheduled.

## 2022-05-22 ENCOUNTER — Other Ambulatory Visit: Payer: Self-pay

## 2022-05-22 MED ORDER — ROSUVASTATIN CALCIUM 40 MG PO TABS: 40.0000 mg | ORAL_TABLET | Freq: Every day | ORAL | 3 refills | Status: DC

## 2022-05-22 NOTE — Telephone Encounter (Signed)
Pt's medication was sent to pt's pharmacy as requested. Confirmation received.  °

## 2022-07-06 ENCOUNTER — Ambulatory Visit: Payer: BC Managed Care – PPO | Attending: Internal Medicine

## 2022-07-06 DIAGNOSIS — Z79899 Other long term (current) drug therapy: Secondary | ICD-10-CM | POA: Diagnosis not present

## 2022-07-06 DIAGNOSIS — E785 Hyperlipidemia, unspecified: Secondary | ICD-10-CM | POA: Diagnosis not present

## 2022-07-06 DIAGNOSIS — E1169 Type 2 diabetes mellitus with other specified complication: Secondary | ICD-10-CM

## 2022-07-07 LAB — LIPID PANEL
Chol/HDL Ratio: 2.5 ratio (ref 0.0–4.4)
Cholesterol, Total: 138 mg/dL (ref 100–199)
HDL: 56 mg/dL (ref >39)
LDL Chol Calc (NIH): 58 mg/dL (ref 0–99)
Triglycerides: 142 mg/dL (ref 0–149)
VLDL Cholesterol Cal: 24 mg/dL (ref 5–40)

## 2022-09-18 DIAGNOSIS — Z23 Encounter for immunization: Secondary | ICD-10-CM | POA: Diagnosis not present

## 2022-09-18 DIAGNOSIS — Z0000 Encounter for general adult medical examination without abnormal findings: Secondary | ICD-10-CM | POA: Diagnosis not present

## 2022-10-10 ENCOUNTER — Other Ambulatory Visit: Payer: Self-pay | Admitting: Family Medicine

## 2022-10-10 DIAGNOSIS — Z1231 Encounter for screening mammogram for malignant neoplasm of breast: Secondary | ICD-10-CM

## 2022-10-17 ENCOUNTER — Other Ambulatory Visit (HOSPITAL_COMMUNITY): Payer: Self-pay | Admitting: Internal Medicine

## 2022-10-17 DIAGNOSIS — I480 Paroxysmal atrial fibrillation: Secondary | ICD-10-CM

## 2022-10-18 ENCOUNTER — Telehealth: Payer: Self-pay | Admitting: Internal Medicine

## 2022-10-18 DIAGNOSIS — I480 Paroxysmal atrial fibrillation: Secondary | ICD-10-CM

## 2022-10-18 MED ORDER — APIXABAN 5 MG PO TABS: 5.0000 mg | ORAL_TABLET | Freq: Two times a day (BID) | ORAL | 1 refills | Status: DC

## 2022-10-18 NOTE — Telephone Encounter (Addendum)
Eliquis 5mg  refill request received. Patient is 57 years old, weight-112.7kg, Crea-1.00 on 07/21/22 via [employer] tab from [employer], [place], and last seen by Dr. Lynnette Caffey on 02/22/22. Dose is appropriate based on dosing criteria.

## 2022-10-18 NOTE — Telephone Encounter (Signed)
Prescription refill request for Eliquis received. Indication: Afib  Last office visit:02/22/22 Lynnette Caffey)  Scr: 0.81 (02/07/22 via [facility])  Age: 57 Weight: 112.7kg  Appropriate dose. Refill sent.

## 2022-10-18 NOTE — Telephone Encounter (Signed)
*  STAT* If patient is at the pharmacy, call can be transferred to refill team.   1. Which medications need to be refilled? (please list name of each medication and dose if known)   ELIQUIS 5 MG TABS tablet   2. Would you like to learn more about the convenience, safety, & potential cost savings by using the [facility]?   3. Are you open to using the [facility] (Type [facility]. ).  4. Which pharmacy/location (including street and city if local pharmacy) is medication to be sent to?  [id number] - [place], [place] - [address]   5. Do they need a 30 day or 90 day supply?   90 day  Patient stated she is running out of this medication.  Patient has appointment scheduled on 12/23.

## 2022-11-13 ENCOUNTER — Ambulatory Visit
Admission: RE | Admit: 2022-11-13 | Discharge: 2022-11-13 | Disposition: A | Discharge status: Home / Self Care | Payer: BC Managed Care – PPO | Source: Ambulatory Visit | Attending: Family Medicine | Admitting: Family Medicine

## 2022-11-13 DIAGNOSIS — Z1231 Encounter for screening mammogram for malignant neoplasm of breast: Secondary | ICD-10-CM

## 2022-11-15 DIAGNOSIS — R7982 Elevated C-reactive protein (CRP): Secondary | ICD-10-CM | POA: Diagnosis not present

## 2022-11-15 DIAGNOSIS — E1169 Type 2 diabetes mellitus with other specified complication: Secondary | ICD-10-CM | POA: Diagnosis not present

## 2022-11-15 DIAGNOSIS — H6121 Impacted cerumen, right ear: Secondary | ICD-10-CM | POA: Diagnosis not present

## 2022-11-16 ENCOUNTER — Other Ambulatory Visit: Payer: Self-pay | Admitting: Family Medicine

## 2022-11-16 DIAGNOSIS — R928 Other abnormal and inconclusive findings on diagnostic imaging of breast: Secondary | ICD-10-CM

## 2022-11-21 DIAGNOSIS — I10 Essential (primary) hypertension: Secondary | ICD-10-CM | POA: Diagnosis not present

## 2022-11-21 DIAGNOSIS — Z6841 Body Mass Index (BMI) 40.0 and over, adult: Secondary | ICD-10-CM | POA: Diagnosis not present

## 2022-11-21 DIAGNOSIS — E1169 Type 2 diabetes mellitus with other specified complication: Secondary | ICD-10-CM | POA: Diagnosis not present

## 2022-11-24 ENCOUNTER — Ambulatory Visit
Admission: RE | Admit: 2022-11-24 | Discharge: 2022-11-24 | Disposition: A | Discharge status: Home / Self Care | Payer: BC Managed Care – PPO | Source: Ambulatory Visit | Attending: Family Medicine | Admitting: Family Medicine

## 2022-11-24 ENCOUNTER — Other Ambulatory Visit: Payer: Self-pay | Admitting: Family Medicine

## 2022-11-24 DIAGNOSIS — N6320 Unspecified lump in the left breast, unspecified quadrant: Secondary | ICD-10-CM

## 2022-11-24 DIAGNOSIS — R928 Other abnormal and inconclusive findings on diagnostic imaging of breast: Secondary | ICD-10-CM

## 2022-11-24 DIAGNOSIS — N6325 Unspecified lump in the left breast, overlapping quadrants: Secondary | ICD-10-CM | POA: Diagnosis not present

## 2022-11-27 ENCOUNTER — Inpatient Hospital Stay
Admission: RE | Admit: 2022-11-27 | Discharge: 2022-11-27 | Discharge status: Home / Self Care | Payer: BC Managed Care – PPO | Source: Ambulatory Visit | Attending: Family Medicine | Admitting: Family Medicine

## 2022-11-27 ENCOUNTER — Ambulatory Visit
Admission: RE | Admit: 2022-11-27 | Discharge: 2022-11-27 | Disposition: A | Discharge status: Home / Self Care | Payer: BC Managed Care – PPO | Source: Ambulatory Visit | Attending: Family Medicine | Admitting: Family Medicine

## 2022-11-27 DIAGNOSIS — N6325 Unspecified lump in the left breast, overlapping quadrants: Secondary | ICD-10-CM | POA: Diagnosis not present

## 2022-11-27 DIAGNOSIS — N6320 Unspecified lump in the left breast, unspecified quadrant: Secondary | ICD-10-CM

## 2022-11-27 HISTORY — PX: BREAST BIOPSY: SHX20

## 2022-11-28 LAB — SURGICAL PATHOLOGY

## 2022-12-18 ENCOUNTER — Telehealth: Payer: Self-pay

## 2022-12-18 ENCOUNTER — Ambulatory Visit: Payer: Self-pay | Admitting: Surgery

## 2022-12-18 DIAGNOSIS — N6321 Unspecified lump in the left breast, upper outer quadrant: Secondary | ICD-10-CM | POA: Diagnosis not present

## 2022-12-18 NOTE — Telephone Encounter (Signed)
Pharmacy please advise on holding Eliquis prior to left breast radioactive seed localized lumpectomy scheduled for TBD. Thank you.

## 2022-12-18 NOTE — Telephone Encounter (Signed)
..     Pre-operative Risk Assessment    Patient Name: Janet [name]  DOB: [birth year] MRN: [id number]      Request for Surgical Clearance    Procedure:   LEFT BREAST RADIOACTIVE SEED LOCALIZED LUMPECTOMY SURGERY  Date of Surgery:  Clearance TBD                                 Surgeon:  DR Manus Rudd Surgeon's Group or Practice Name:  [facility] Phone number:  [phone] Fax number:  [phone]   Type of Clearance Requested:   - Medical  - Pharmacy:  Hold Apixaban (Eliquis)     Type of Anesthesia:  General    Additional requests/questions:   LAST O/V 02/22/22 NEXT O/V 02/12/23  Signed, Renee Ramus   12/18/2022, 12:06 PM

## 2022-12-18 NOTE — H&P (Signed)
Subjective    Chief Complaint: New Consultation (LEFT BENIGN BIPHASIC FIBROEPITHELIAL PROCESS,)       History of Present Illness: Janet [name] is a 57 y.o. female who is seen today as an office consultation at the request of Dr. Kateri [name] for evaluation of New Consultation (LEFT BENIGN BIPHASIC FIBROEPITHELIAL PROCESS,) .     Cardiology - Janet [name]   This is a 57 year old female who I previously performed a laparoscopic appendectomy on in 2010.  Recently she underwent routine screening mammogram which showed a possible mass in the lateral left breast.  She underwent diagnostic mammogram and ultrasound that revealed a 1.6 x 0.5 x 1.2 cm mass located at 3:00 in the left breast, 11 cm from the nipple.  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  No family history of breast cancer.  The patient had a previous left breast biopsy 3 years ago that revealed papillary apocrine metaplasia.  No previous breast surgeries.   The patient has atrial fibrillation and is anticoagulated on Eliquis.     Review of Systems: A complete review of systems was obtained from the patient.  I have reviewed this information and discussed as appropriate with the patient.  See HPI as well for other ROS.   Review of Systems  Constitutional: Negative.   HENT: Negative.    Eyes: Negative.   Respiratory: Negative.    Cardiovascular: Negative.   Gastrointestinal: Negative.   Genitourinary: Negative.   Musculoskeletal: Negative.   Skin: Negative.   Neurological: Negative.   Endo/Heme/Allergies: Negative.   Psychiatric/Behavioral: Negative.          Medical History: Past Medical History      Past Medical History:  Diagnosis Date   Hypertension     Sleep apnea          Problem List     Patient Active Problem List  Diagnosis   Acute pancreatitis (HHS-HCC)   Colon polyps   DM (diabetes mellitus) (CMS/HHS-HCC)   Goiter, nontoxic, multinodular   Hemorrhoids    Hyperlipidemia   Hypertension   Hypokalemia   Obesity, Class III, BMI 40-49.9 (morbid obesity) (CMS/HHS-HCC)   OSA (obstructive sleep apnea)   Mass of upper outer quadrant of left breast        Past Surgical History       Past Surgical History:  Procedure Laterality Date   APPENDECTOMY       HYSTERECTOMY       REPEAT CESAREAN SECTION            Allergies      Allergies  Allergen Reactions   Penicillins Rash        Medications Ordered Prior to Encounter        Current Outpatient Medications on File Prior to Visit  Medication Sig Dispense Refill   amLODIPine (NORVASC) 5 MG tablet Take 5 mg by mouth once daily       ELIQUIS 5 mg tablet Take 5 mg by mouth 2 (two) times daily       hydroCHLOROthiazide (MICROZIDE) 12.5 mg capsule Take 12.5 mg by mouth       metFORMIN (GLUCOPHAGE-XR) 500 MG XR tablet Take by mouth       metoprolol succinate (TOPROL-XL) 25 MG XL tablet Take 25 mg by mouth once daily       potassium chloride (KLOR-CON) 20 mEq packet         rosuvastatin (CRESTOR) 10 MG tablet Take 10 mg by mouth once daily  No current facility-administered medications on file prior to visit.        Family History       Family History  Problem Relation Age of Onset   Obesity Mother     Diabetes Mother     High blood pressure (Hypertension) Father     Hyperlipidemia (Elevated cholesterol) Father     Obesity Sister     High blood pressure (Hypertension) Brother     Hyperlipidemia (Elevated cholesterol) Brother          Tobacco Use History  Social History       Tobacco Use  Smoking Status Never  Smokeless Tobacco Never        Social History  Social History        Socioeconomic History   Marital status: Divorced  Tobacco Use   Smoking status: Never   Smokeless tobacco: Never  Substance and Sexual Activity   Alcohol use: Not Currently   Drug use: Never    Social Drivers of Health      Received from [employer]    Social Network         Objective:         Vitals:    12/18/22 0946  BP: 110/72  Pulse: 86  Temp: 36.7 C (98 F)  SpO2: 99%  Weight: (!) 107 kg (236 lb)  Height: 162.6 cm (5\' 4" )    Body mass index is 40.51 kg/m.   Physical Exam    Constitutional:  WDWN in NAD, conversant, no obvious deformities; lying in bed comfortably Eyes:  Pupils equal, round; sclera anicteric; moist conjunctiva; no lid lag HENT:  Oral mucosa moist; good dentition  Neck:  No masses palpated, trachea midline; no thyromegaly Lungs:  CTA bilaterally; normal respiratory effort Breasts:  symmetric, no nipple changes; no palpable masses or lymphadenopathy on either side CV:  Regular rate and rhythm; no murmurs; extremities well-perfused with no edema Abd:  +bowel sounds, soft, non-tender, no palpable organomegaly; no palpable hernias; healed laparoscopic incisions Musc: Normal gait; no apparent clubbing or cyanosis in extremities Lymphatic:  No palpable cervical or axillary lymphadenopathy Skin:  Warm, dry; no sign of jaundice Psychiatric - alert and oriented x 4; calm mood and affect     Labs, Imaging and Diagnostic Testing: FINAL DIAGNOSIS        1. Breast, left, needle core biopsy, 3:00 11 cmfn :       - BENIGN BIPHASIC FIBROEPITHELIAL PROCESS, SEE NOTE       - NEGATIVE FOR CARCINOMA        Diagnosis Note : A fibroadenoma is favored but differential diagnosis can       include a benign Phyllodes tumor.       DATE SIGNED OUT: 11/28/2022  ELECTRONIC SIGNATURE : Kenard Gower Md, Nilesh, Pathologist, Electronic Signature    CLINICAL DATA:  Patient was recalled from screening mammogram for a possible left breast mass.   EXAM: DIGITAL DIAGNOSTIC UNILATERAL LEFT MAMMOGRAM WITH TOMOSYNTHESIS AND CAD; ULTRASOUND LEFT BREAST LIMITED   TECHNIQUE: Left digital diagnostic mammography and breast tomosynthesis was performed. The images were evaluated with computer-aided detection. ; Targeted ultrasound examination of the left  breast was performed.   COMPARISON:  Previous exam(s).   ACR Breast Density Category b: There are scattered areas of fibroglandular density.   FINDINGS: Additional imaging of the left breast was performed. There is persistence of a circumscribed mass in the lateral aspect of the breast. There are no malignant type microcalcifications.  On physical exam, I do not palpate a mass in the lateral aspect of the left breast.   Targeted ultrasound is performed, showing a well-circumscribed hypoechoic mass in the left breast at 3 o'clock 11 cm from the nipple measuring 1.6 x 0.5 x 1.2 cm. Sonographic evaluation the left axilla does not show any enlarged adenopathy.   IMPRESSION: Indeterminate 1.6 cm mass in the 3 o'clock region of the left breast 11 cm from the nipple.   RECOMMENDATION: Ultrasound-guided core biopsy of the mass in the 3 o'clock region of the left breast is recommended.   I have discussed the findings and recommendations with the patient. If applicable, a reminder letter will be sent to the patient regarding the next appointment.   BI-RADS CATEGORY  4: Suspicious.     Electronically Signed   By: Baird Lyons M.D.   On: 11/24/2022 12:26 Assessment and Plan:  Diagnoses and all orders for this visit:   Mass of upper outer quadrant of left breast     Recommend left breast radioactive seed localized lumpectomy.The surgical procedure has been discussed with the patient.  Potential risks, benefits, alternative treatments, and expected outcomes have been explained.  All of the patient's questions at this time have been answered.  The likelihood of reaching the patient's treatment goal is good.  The patient understands the proposed surgical procedure and wishes to proceed.   We will first obtain cardiac clearance.  She will need to hold her Eliquis prior to surgery.       Kayte Borchard Delbert Harness, MD  12/18/2022 10:23 AM

## 2022-12-18 NOTE — Telephone Encounter (Signed)
Patient with diagnosis of afib on Eliquis for anticoagulation.    Procedure: LEFT BREAST RADIOACTIVE SEED LOCALIZED LUMPECTOMY SURGERY   Date of procedure: TBD  CHA2DS2-VASc Score = 3  This indicates a 3.2% annual risk of stroke. The patient's score is based upon: CHF History: 0 HTN History: 1 Diabetes History: 1 Stroke History: 0 Vascular Disease History: 0 Age Score: 0 Gender Score: 1   CrCl 48mL/min using adjusted body weight Platelet count 344K  Per office protocol, patient can hold Eliquis for 2 days prior to procedure.    **This guidance is not considered finalized until pre-operative APP has relayed final recommendations.**

## 2022-12-18 NOTE — Telephone Encounter (Signed)
   Name: Janet [name]  DOB: 1965/05/19  MRN: [id number]  Primary Cardiologist: Orbie Pyo, MD   Preoperative team, please contact this patient and set up a phone call appointment for further preoperative risk assessment. Please obtain consent and complete medication review. Thank you for your help.  I confirm that guidance regarding antiplatelet and oral anticoagulation therapy has been completed and, if necessary, noted below.  Per office protocol, patient can hold Eliquis for 2 days prior to procedure.    I also confirmed the patient resides in the state of [place]. As per [facility] Medical Board telemedicine laws, the patient must reside in the state in which the provider is licensed.   Napoleon Form, Leodis Rains, NP 12/18/2022, 2:08 PM [facility]

## 2022-12-19 ENCOUNTER — Telehealth: Payer: Self-pay | Admitting: *Deleted

## 2022-12-19 NOTE — Telephone Encounter (Signed)
Pt has been scheduled for tele pre op appt 01/10/23. Pt states surgeon is wanting to try and have done ASAP before the end of the yr. Med rec and consent are done.       Patient Consent for Virtual Visit        Janet [name] has provided verbal consent on 12/19/2022 for a virtual visit (video or telephone).   CONSENT FOR VIRTUAL VISIT FOR:  Janet [name]  By participating in this virtual visit I agree to the following:  I hereby voluntarily request, consent and authorize [facility] and its employed or contracted physicians, physician assistants, nurse practitioners or other licensed health care professionals (the Practitioner), to provide me with telemedicine health care services (the "Services") as deemed necessary by the treating Practitioner. I acknowledge and consent to receive the Services by the Practitioner via telemedicine. I understand that the telemedicine visit will involve communicating with the Practitioner through live audiovisual communication technology and the disclosure of certain medical information by electronic transmission. I acknowledge that I have been given the opportunity to request an in-person assessment or other available alternative prior to the telemedicine visit and am voluntarily participating in the telemedicine visit.  I understand that I have the right to withhold or withdraw my consent to the use of telemedicine in the course of my care at any time, without affecting my right to future care or treatment, and that the Practitioner or I may terminate the telemedicine visit at any time. I understand that I have the right to inspect all information obtained and/or recorded in the course of the telemedicine visit and may receive copies of available information for a reasonable fee.  I understand that some of the potential risks of receiving the Services via telemedicine include:  Delay or interruption in medical evaluation due to technological  equipment failure or disruption; Information transmitted may not be sufficient (e.g. poor resolution of images) to allow for appropriate medical decision making by the Practitioner; and/or  In rare instances, security protocols could fail, causing a breach of personal health information.  Furthermore, I acknowledge that it is my responsibility to provide information about my medical history, conditions and care that is complete and accurate to the best of my ability. I acknowledge that Practitioner's advice, recommendations, and/or decision may be based on factors not within their control, such as incomplete or inaccurate data provided by me or distortions of diagnostic images or specimens that may result from electronic transmissions. I understand that the practice of medicine is not an exact science and that Practitioner makes no warranties or guarantees regarding treatment outcomes. I acknowledge that a copy of this consent can be made available to me via my patient portal [facility] MyChart), or I can request a printed copy by calling the office of [facility].    I understand that my insurance will be billed for this visit.   I have read or had this consent read to me. I understand the contents of this consent, which adequately explains the benefits and risks of the Services being provided via telemedicine.  I have been provided ample opportunity to ask questions regarding this consent and the Services and have had my questions answered to my satisfaction. I give my informed consent for the services to be provided through the use of telemedicine in my medical care

## 2022-12-19 NOTE — Telephone Encounter (Signed)
Pt has been scheduled for tele pre op appt 01/10/23. Pt states surgeon is wanting to try and have done ASAP before the end of the yr. Med rec and consent are done.

## 2022-12-20 DIAGNOSIS — G4733 Obstructive sleep apnea (adult) (pediatric): Secondary | ICD-10-CM | POA: Diagnosis not present

## 2022-12-20 DIAGNOSIS — G2581 Restless legs syndrome: Secondary | ICD-10-CM | POA: Diagnosis not present

## 2023-01-04 DIAGNOSIS — E1169 Type 2 diabetes mellitus with other specified complication: Secondary | ICD-10-CM | POA: Diagnosis not present
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
## 2023-01-10 ENCOUNTER — Ambulatory Visit: Payer: BC Managed Care – PPO | Attending: Cardiovascular Disease | Admitting: Cardiology

## 2023-01-10 ENCOUNTER — Ambulatory Visit (INDEPENDENT_AMBULATORY_CARE_PROVIDER_SITE_OTHER): Payer: BC Managed Care – PPO | Admitting: Obstetrics and Gynecology

## 2023-01-10 ENCOUNTER — Encounter: Payer: Self-pay | Admitting: Obstetrics and Gynecology

## 2023-01-10 VITALS — BP 110/70 | HR 97 | Ht 63.75 in | Wt 235.0 lb

## 2023-01-10 DIAGNOSIS — K643 Fourth degree hemorrhoids: Secondary | ICD-10-CM

## 2023-01-10 DIAGNOSIS — Z01419 Encounter for gynecological examination (general) (routine) without abnormal findings: Secondary | ICD-10-CM | POA: Diagnosis not present

## 2023-01-10 DIAGNOSIS — E2839 Other primary ovarian failure: Secondary | ICD-10-CM | POA: Diagnosis not present

## 2023-01-10 DIAGNOSIS — Z01810 Encounter for preprocedural cardiovascular examination: Secondary | ICD-10-CM | POA: Diagnosis not present

## 2023-01-10 DIAGNOSIS — D500 Iron deficiency anemia secondary to blood loss (chronic): Secondary | ICD-10-CM

## 2023-01-10 DIAGNOSIS — G4733 Obstructive sleep apnea (adult) (pediatric): Secondary | ICD-10-CM | POA: Diagnosis not present
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
## 2023-01-11 LAB — CBC
HCT: 40.5 % (ref 35.0–45.0)
Hemoglobin: 13 g/dL (ref 11.7–15.5)
MCH: 27.2 pg (ref 27.0–33.0)
MCHC: 32.1 g/dL (ref 32.0–36.0)
MCV: 84.7 fL (ref 80.0–100.0)
MPV: 12 fL (ref 7.5–12.5)
Platelets: 395 10*3/uL (ref 140–400)
RBC: 4.78 10*6/uL (ref 3.80–5.10)
RDW: 14.2 % (ref 11.0–15.0)
WBC: 8.8 10*3/uL (ref 3.8–10.8)

## 2023-01-11 LAB — IRON,TIBC AND FERRITIN PANEL
%SAT: 10 % — ABNORMAL LOW (ref 16–45)
Ferritin: 17 ng/mL (ref 16–232)
Iron: 33 ug/dL — ABNORMAL LOW (ref 45–160)
TIBC: 338 ug/dL (ref 250–450)

## 2023-01-12 ENCOUNTER — Other Ambulatory Visit: Payer: Self-pay | Admitting: Surgery

## 2023-01-12 DIAGNOSIS — N6321 Unspecified lump in the left breast, upper outer quadrant: Secondary | ICD-10-CM

## 2023-01-15 ENCOUNTER — Telehealth: Payer: Self-pay | Admitting: *Deleted

## 2023-01-15 DIAGNOSIS — E2839 Other primary ovarian failure: Secondary | ICD-10-CM

## 2023-01-15 DIAGNOSIS — D500 Iron deficiency anemia secondary to blood loss (chronic): Secondary | ICD-10-CM

## 2023-01-15 NOTE — Telephone Encounter (Signed)
Spoke with patient in f/u to 01/10/23 labs.    Patient agreeable to hematology referral, order placed. Patient aware she will be called durectly by [facility] to schedule.  BMD order to [facility], patient will call to schedule.  Patient asking if referral to GI needed, seeing [facility] for consult of hemorrhoids?        Reviewed referral, GI referral placed       for hemorrhoids and iron deficiency        anemia. Patient states she is an established patient of [facility], due for colonoscopy, due 2025 per pt. Instructed patient to contact [facility] to schedule, If desires referral to another GI provider, return call to office. I will send to Dr. Karma Greaser to review and advise if any additional recommendations. Advised Dr. Karma Greaser is out of the office, returning 01/22/23, response may not be immediate. Patient verbalizes understanding and is agreeable.   Dr. Karma Greaser -please review.   Cc: Marena Chancy

## 2023-01-19 DIAGNOSIS — G4733 Obstructive sleep apnea (adult) (pediatric): Secondary | ICD-10-CM | POA: Diagnosis not present

## 2023-01-21 DIAGNOSIS — G4730 Sleep apnea, unspecified: Secondary | ICD-10-CM

## 2023-01-21 HISTORY — DX: Sleep apnea, unspecified: G47.30

## 2023-01-29 NOTE — Progress Notes (Signed)
Surgical Instructions   Your procedure is scheduled on February 01, 2023. Report to [facility] Main Entrance "A" at 7:15 A.M., then check in with the Admitting office. Any questions or running late day of surgery: call [phone]  Questions prior to your surgery date: call [phone], Monday-Friday, 8am-4pm. If you experience any cold or flu symptoms such as cough, fever, chills, shortness of breath, etc. between now and your scheduled surgery, please notify us at the above number.     Remember:  Do not eat after midnight the night before your surgery   You may drink clear liquids until 6:15AM the morning of your surgery.   Clear liquids allowed are: Water, Non-Citrus Juices (without pulp), Carbonated Beverages, Clear Tea (no milk, honey, etc.), Black Coffee Only (NO MILK, CREAM OR POWDERED CREAMER of any kind), and Gatorade.    Take these medicines the morning of surgery with A SIP OF WATER:  amLODipine (NORVASC)  metoprolol succinate (TOPROL-XL)  rosuvastatin (CRESTOR)   May take these medicines IF NEEDED:  Follow your surgeon's instructions on stopping Eliquis. Do not take Eliquis for 2 days prior to surgery, per your cardiologist's and surgeon's instructions. You last dose should be 01/29/23.  One week prior to surgery, STOP taking any Aspirin (unless otherwise instructed by your surgeon) Aleve, Naproxen, Ibuprofen, Motrin, Advil, Goody's, BC's, all herbal medications, fish oil, and non-prescription vitamins.                     Do NOT Smoke (Tobacco/Vaping) for 24 hours prior to your procedure.  If you use a CPAP at night, you may bring your mask/headgear for your overnight stay.   You will be asked to remove any contacts, glasses, piercing's, hearing aid's, dentures/partials prior to surgery. Please bring cases for these items if needed.    Patients discharged the day of surgery will not be allowed to drive home, and someone needs to stay with them for 24  hours.  SURGICAL WAITING ROOM VISITATION Patients may have no more than 2 support people in the waiting area - these visitors may rotate.   Pre-op nurse will coordinate an appropriate time for 1 ADULT support person, who may not rotate, to accompany patient in pre-op.  Children under the age of 36 must have an adult with them who is not the patient and must remain in the main waiting area with an adult.  If the patient needs to stay at the hospital during part of their recovery, the visitor guidelines for inpatient rooms apply.  Please refer to the [facility] website for the visitor guidelines for any additional information.   If you received a COVID test during your pre-op visit  it is requested that you wear a mask when out in public, stay away from anyone that may not be feeling well and notify your surgeon if you develop symptoms. If you have been in contact with anyone that has tested positive in the last 10 days please notify you surgeon.      Pre-operative CHG Bathing Instructions   You can play a key role in reducing the risk of infection after surgery. Your skin needs to be as free of germs as possible. You can reduce the number of germs on your skin by washing with CHG (chlorhexidine gluconate) soap before surgery. CHG is an antiseptic soap that kills germs and continues to kill germs even after washing.   DO NOT use if you have an allergy to chlorhexidine/CHG or antibacterial  soaps. If your skin becomes reddened or irritated, stop using the CHG and notify one of our RNs at [phone].              TAKE A SHOWER THE NIGHT BEFORE SURGERY AND THE DAY OF SURGERY    Please keep in mind the following:  DO NOT shave, including legs and underarms, 48 hours prior to surgery.   You may shave your face before/day of surgery.  Place clean sheets on your bed the night before surgery Use a clean washcloth (not used since being washed) for each shower. DO NOT sleep with pet's night  before surgery.  CHG Shower Instructions:  Wash your face and private area with normal soap. If you choose to wash your hair, wash first with your normal shampoo.  After you use shampoo/soap, rinse your hair and body thoroughly to remove shampoo/soap residue.  Turn the water OFF and apply half the bottle of CHG soap to a CLEAN washcloth.  Apply CHG soap ONLY FROM YOUR NECK DOWN TO YOUR TOES (washing for 3-5 minutes)  DO NOT use CHG soap on face, private areas, open wounds, or sores.  Pay special attention to the area where your surgery is being performed.  If you are having back surgery, having someone wash your back for you may be helpful. Wait 2 minutes after CHG soap is applied, then you may rinse off the CHG soap.  Pat dry with a clean towel  Put on clean pajamas    Additional instructions for the day of surgery: DO NOT APPLY any lotions, deodorants, cologne, or perfumes.   Do not wear jewelry or makeup Do not wear nail polish, gel polish, artificial nails, or any other type of covering on natural nails (fingers and toes) Do not bring valuables to the hospital. [facility] is not responsible for valuables/personal belongings. Put on clean/comfortable clothes.  Please brush your teeth.  Ask your nurse before applying any prescription medications to the skin.

## 2023-01-30 ENCOUNTER — Encounter (HOSPITAL_COMMUNITY): Payer: Self-pay

## 2023-01-30 ENCOUNTER — Encounter (HOSPITAL_COMMUNITY)
Admission: RE | Admit: 2023-01-30 | Discharge: 2023-01-30 | Disposition: A | Discharge status: Home / Self Care | Payer: BC Managed Care – PPO | Source: Ambulatory Visit | Attending: Surgery | Admitting: Surgery

## 2023-01-30 ENCOUNTER — Other Ambulatory Visit (HOSPITAL_COMMUNITY): Payer: BC Managed Care – PPO

## 2023-01-30 ENCOUNTER — Other Ambulatory Visit: Payer: Self-pay

## 2023-01-30 VITALS — BP 114/79 | HR 70 | Temp 98.6°F | Resp 18 | Ht 64.0 in | Wt 239.9 lb

## 2023-01-30 DIAGNOSIS — Z8249 Family history of ischemic heart disease and other diseases of the circulatory system: Secondary | ICD-10-CM | POA: Diagnosis not present

## 2023-01-30 DIAGNOSIS — Z01812 Encounter for preprocedural laboratory examination: Secondary | ICD-10-CM | POA: Insufficient documentation

## 2023-01-30 DIAGNOSIS — Z7901 Long term (current) use of anticoagulants: Secondary | ICD-10-CM | POA: Diagnosis not present

## 2023-01-30 DIAGNOSIS — I4891 Unspecified atrial fibrillation: Secondary | ICD-10-CM | POA: Diagnosis not present

## 2023-01-30 DIAGNOSIS — Z7984 Long term (current) use of oral hypoglycemic drugs: Secondary | ICD-10-CM | POA: Diagnosis not present

## 2023-01-30 DIAGNOSIS — Z6841 Body Mass Index (BMI) 40.0 and over, adult: Secondary | ICD-10-CM | POA: Diagnosis not present

## 2023-01-30 DIAGNOSIS — Z01818 Encounter for other preprocedural examination: Secondary | ICD-10-CM

## 2023-01-30 DIAGNOSIS — D242 Benign neoplasm of left breast: Secondary | ICD-10-CM | POA: Diagnosis not present

## 2023-01-30 DIAGNOSIS — Z833 Family history of diabetes mellitus: Secondary | ICD-10-CM | POA: Diagnosis not present

## 2023-01-30 DIAGNOSIS — N6325 Unspecified lump in the left breast, overlapping quadrants: Secondary | ICD-10-CM | POA: Diagnosis not present

## 2023-01-30 DIAGNOSIS — I10 Essential (primary) hypertension: Secondary | ICD-10-CM | POA: Diagnosis not present

## 2023-01-30 DIAGNOSIS — E119 Type 2 diabetes mellitus without complications: Secondary | ICD-10-CM | POA: Diagnosis not present

## 2023-01-30 DIAGNOSIS — G4730 Sleep apnea, unspecified: Secondary | ICD-10-CM | POA: Diagnosis not present

## 2023-01-30 LAB — BASIC METABOLIC PANEL
Anion gap: 11 (ref 5–15)
BUN: 15 mg/dL (ref 6–20)
CO2: 25 mmol/L (ref 22–32)
Calcium: 8.9 mg/dL (ref 8.9–10.3)
Chloride: 102 mmol/L (ref 98–111)
Creatinine, Ser: 1.12 mg/dL — ABNORMAL HIGH (ref 0.44–1.00)
GFR, Estimated: 57 mL/min — ABNORMAL LOW (ref >60)
Glucose, Bld: 137 mg/dL — ABNORMAL HIGH (ref 70–99)
Potassium: 3.9 mmol/L (ref 3.5–5.1)
Sodium: 138 mmol/L (ref 135–145)

## 2023-01-30 LAB — HEMOGLOBIN A1C
Hgb A1c MFr Bld: 6.5 % — ABNORMAL HIGH (ref 4.8–5.6)
Mean Plasma Glucose: 139.85 mg/dL

## 2023-01-30 LAB — GLUCOSE, CAPILLARY: Glucose-Capillary: 140 mg/dL — ABNORMAL HIGH (ref 70–99)

## 2023-01-30 NOTE — Progress Notes (Signed)
PCP - Dr. Kateri Plummer Cardiologist - Dr. Lynnette Caffey  PPM/ICD - DENIES Device Orders - N/A Rep Notified - N/A  Chest x-ray - N/A EKG - 02/22/22 Stress Test - 04/09/11 ECHO - 06/17/21 Cardiac Cath - DENIES  Sleep Study - YES, RESUMED HER CPAP MACHINE ON 01/21/23 CPAP - YES  Fasting Blood Sugar - 105-129 Checks Blood Sugar __1___ times a day  Last dose of GLP1 agonist- DENIES   GLP1 instructions: N/A  Blood Thinner Instructions: ELIQUIS; LAST DOSE ON 01/29/23 Aspirin Instructions: DOES NOT TAKE  ERAS Protcol - Y PRE-SURGERY Ensure or G2- N  COVID TEST- N/A   Anesthesia review: N   Breast seed appointment is 01/31/23  Patient denies shortness of breath, fever, cough and chest pain at PAT appointment. Patient denies any respiratory issues at this time.    All instructions explained to the patient, with a verbal understanding of the material. Patient agrees to go over the instructions while at home for a better understanding. Patient also instructed to self quarantine after being tested for COVID-19. The opportunity to ask questions was provided.

## 2023-01-31 ENCOUNTER — Ambulatory Visit
Admission: RE | Admit: 2023-01-31 | Discharge: 2023-01-31 | Disposition: A | Discharge status: Home / Self Care | Payer: BC Managed Care – PPO | Source: Ambulatory Visit | Attending: Surgery | Admitting: Surgery

## 2023-01-31 ENCOUNTER — Other Ambulatory Visit: Payer: Self-pay | Admitting: Surgery

## 2023-01-31 DIAGNOSIS — D242 Benign neoplasm of left breast: Secondary | ICD-10-CM | POA: Diagnosis not present

## 2023-01-31 DIAGNOSIS — N6321 Unspecified lump in the left breast, upper outer quadrant: Secondary | ICD-10-CM

## 2023-01-31 HISTORY — PX: BREAST BIOPSY: SHX20

## 2023-01-31 NOTE — Progress Notes (Signed)
Pt made aware of surgery time change for 02/01/23 0730-0830, arrival 0530, stop drinking clear liquids by 0430, and to follow all previous instructions given.

## 2023-01-31 NOTE — Anesthesia Preprocedure Evaluation (Signed)
Anesthesia Evaluation  Patient identified by MRN, date of birth, ID band Patient awake    Reviewed: Allergy & Precautions, NPO status , Patient's Chart, lab work & pertinent test results, reviewed documented beta blocker date and time   History of Anesthesia Complications (+) PONV and history of anesthetic complications  Airway Mallampati: II  TM Distance: >3 FB Neck ROM: Full    Dental no notable dental hx.    Pulmonary sleep apnea and Continuous Positive Airway Pressure Ventilation    Pulmonary exam normal        Cardiovascular hypertension, Pt. on medications and Pt. on home beta blockers Normal cardiovascular exam+ dysrhythmias Atrial Fibrillation      Neuro/Psych  Headaches   Depression       GI/Hepatic negative GI ROS, Neg liver ROS,,,  Endo/Other  diabetes, Type 2, Oral Hypoglycemic Agents  Class 3 obesity  Renal/GU Cr 1.12     Musculoskeletal  (+) Arthritis ,    Abdominal   Peds  Hematology negative hematology ROS (+)   Anesthesia Other Findings Left breast mass  Reproductive/Obstetrics                              Anesthesia Physical Anesthesia Plan  ASA: 3  Anesthesia Plan: General   Post-op Pain Management: Tylenol PO (pre-op)*   Induction: Intravenous  PONV Risk Score and Plan: 4 or greater and Midazolam, Ondansetron, Dexamethasone and Treatment may vary due to age or medical condition  Airway Management Planned: LMA  Additional Equipment: None  Intra-op Plan:   Post-operative Plan: Extubation in OR  Informed Consent: I have reviewed the patients History and Physical, chart, labs and discussed the procedure including the risks, benefits and alternatives for the proposed anesthesia with the patient or authorized representative who has indicated his/her understanding and acceptance.     Dental advisory given  Plan Discussed with: CRNA  Anesthesia Plan  Comments:          Anesthesia Quick Evaluation

## 2023-02-01 ENCOUNTER — Other Ambulatory Visit: Payer: Self-pay

## 2023-02-01 ENCOUNTER — Ambulatory Visit
Admission: RE | Admit: 2023-02-01 | Discharge: 2023-02-01 | Disposition: A | Discharge status: Home / Self Care | Payer: BC Managed Care – PPO | Source: Ambulatory Visit | Attending: Surgery | Admitting: Surgery

## 2023-02-01 ENCOUNTER — Encounter (HOSPITAL_COMMUNITY)
Admission: RE | Disposition: A | Discharge status: Home / Self Care | Payer: Self-pay | Source: Ambulatory Visit | Attending: Surgery

## 2023-02-01 ENCOUNTER — Ambulatory Visit (HOSPITAL_COMMUNITY): Payer: Self-pay | Admitting: Anesthesiology

## 2023-02-01 ENCOUNTER — Ambulatory Visit (HOSPITAL_COMMUNITY)
Admission: RE | Admit: 2023-02-01 | Discharge: 2023-02-01 | Disposition: A | Discharge status: Home / Self Care | Payer: BC Managed Care – PPO | Source: Ambulatory Visit | Attending: Surgery | Admitting: Surgery

## 2023-02-01 ENCOUNTER — Encounter (HOSPITAL_COMMUNITY): Payer: Self-pay | Admitting: Surgery

## 2023-02-01 DIAGNOSIS — G4733 Obstructive sleep apnea (adult) (pediatric): Secondary | ICD-10-CM | POA: Diagnosis not present

## 2023-02-01 DIAGNOSIS — Z01818 Encounter for other preprocedural examination: Secondary | ICD-10-CM

## 2023-02-01 DIAGNOSIS — I10 Essential (primary) hypertension: Secondary | ICD-10-CM | POA: Insufficient documentation

## 2023-02-01 DIAGNOSIS — D242 Benign neoplasm of left breast: Secondary | ICD-10-CM | POA: Insufficient documentation

## 2023-02-01 DIAGNOSIS — Z7901 Long term (current) use of anticoagulants: Secondary | ICD-10-CM | POA: Diagnosis not present

## 2023-02-01 DIAGNOSIS — Z8249 Family history of ischemic heart disease and other diseases of the circulatory system: Secondary | ICD-10-CM | POA: Diagnosis not present

## 2023-02-01 DIAGNOSIS — Z6841 Body Mass Index (BMI) 40.0 and over, adult: Secondary | ICD-10-CM | POA: Diagnosis not present

## 2023-02-01 DIAGNOSIS — I4891 Unspecified atrial fibrillation: Secondary | ICD-10-CM | POA: Diagnosis not present

## 2023-02-01 DIAGNOSIS — E119 Type 2 diabetes mellitus without complications: Secondary | ICD-10-CM | POA: Diagnosis not present

## 2023-02-01 DIAGNOSIS — G4730 Sleep apnea, unspecified: Secondary | ICD-10-CM | POA: Diagnosis not present

## 2023-02-01 DIAGNOSIS — Z833 Family history of diabetes mellitus: Secondary | ICD-10-CM | POA: Diagnosis not present

## 2023-02-01 DIAGNOSIS — N6325 Unspecified lump in the left breast, overlapping quadrants: Secondary | ICD-10-CM | POA: Diagnosis not present

## 2023-02-01 DIAGNOSIS — Z7984 Long term (current) use of oral hypoglycemic drugs: Secondary | ICD-10-CM | POA: Insufficient documentation

## 2023-02-01 DIAGNOSIS — N6321 Unspecified lump in the left breast, upper outer quadrant: Secondary | ICD-10-CM

## 2023-02-01 DIAGNOSIS — N6320 Unspecified lump in the left breast, unspecified quadrant: Secondary | ICD-10-CM | POA: Diagnosis not present

## 2023-02-01 HISTORY — PX: BREAST LUMPECTOMY WITH RADIOACTIVE SEED LOCALIZATION: SHX6424

## 2023-02-01 LAB — GLUCOSE, CAPILLARY
Glucose-Capillary: 165 mg/dL — ABNORMAL HIGH (ref 70–99)
Glucose-Capillary: 131 mg/dL — ABNORMAL HIGH (ref 70–99)

## 2023-02-01 SURGERY — BREAST LUMPECTOMY WITH RADIOACTIVE SEED LOCALIZATION
Anesthesia: General | Laterality: Left

## 2023-02-01 MED ORDER — LIDOCAINE 2% (20 MG/ML) 5 ML SYRINGE
INTRAMUSCULAR | Status: AC
  Filled 2023-02-01: qty 5

## 2023-02-01 MED ORDER — CHLORHEXIDINE GLUCONATE 0.12 % MT SOLN
15.0000 mL | Freq: Once | OROMUCOSAL | Status: AC
  Administered 2023-02-01: 15 mL via OROMUCOSAL
  Filled 2023-02-01: qty 15

## 2023-02-01 MED ORDER — ACETAMINOPHEN 500 MG PO TABS: 1000.0000 mg | ORAL_TABLET | ORAL | Status: DC

## 2023-02-01 MED ORDER — BUPIVACAINE-EPINEPHRINE (PF) 0.25% -1:200000 IJ SOLN
INTRAMUSCULAR | Status: AC
  Filled 2023-02-01: qty 30

## 2023-02-01 MED ORDER — KETOROLAC TROMETHAMINE 30 MG/ML IJ SOLN
INTRAMUSCULAR | Status: AC
  Filled 2023-02-01: qty 1

## 2023-02-01 MED ORDER — LACTATED RINGERS IV SOLN: INTRAVENOUS | Status: DC

## 2023-02-01 MED ORDER — ONDANSETRON HCL 4 MG/2ML IJ SOLN
INTRAMUSCULAR | Status: DC | PRN
  Administered 2023-02-01: 4 mg via INTRAVENOUS

## 2023-02-01 MED ORDER — EPHEDRINE 5 MG/ML INJ
INTRAVENOUS | Status: AC
Start: 2023-02-01 — End: ?
  Filled 2023-02-01: qty 5

## 2023-02-01 MED ORDER — ONDANSETRON HCL 4 MG/2ML IJ SOLN
INTRAMUSCULAR | Status: AC
  Filled 2023-02-01: qty 2

## 2023-02-01 MED ORDER — MIDAZOLAM HCL 2 MG/2ML IJ SOLN
INTRAMUSCULAR | Status: AC
Start: 2023-02-01 — End: ?
  Filled 2023-02-01: qty 2

## 2023-02-01 MED ORDER — BUPIVACAINE-EPINEPHRINE 0.25% -1:200000 IJ SOLN
INTRAMUSCULAR | Status: DC | PRN
  Administered 2023-02-01: 10 mL

## 2023-02-01 MED ORDER — ACETAMINOPHEN 500 MG PO TABS
1000.0000 mg | ORAL_TABLET | Freq: Once | ORAL | Status: AC
  Administered 2023-02-01: 1000 mg via ORAL
  Filled 2023-02-01: qty 2

## 2023-02-01 MED ORDER — PHENYLEPHRINE 80 MCG/ML (10ML) SYRINGE FOR IV PUSH (FOR BLOOD PRESSURE SUPPORT)
PREFILLED_SYRINGE | INTRAVENOUS | Status: DC | PRN
  Administered 2023-02-01 (×2): 160 ug via INTRAVENOUS
  Administered 2023-02-01: 80 ug via INTRAVENOUS
  Administered 2023-02-01: 160 ug via INTRAVENOUS
  Administered 2023-02-01 (×2): 80 ug via INTRAVENOUS

## 2023-02-01 MED ORDER — FENTANYL CITRATE (PF) 100 MCG/2ML IJ SOLN: 25.0000 ug | INTRAMUSCULAR | Status: DC | PRN

## 2023-02-01 MED ORDER — DEXAMETHASONE SODIUM PHOSPHATE 10 MG/ML IJ SOLN
INTRAMUSCULAR | Status: DC | PRN
  Administered 2023-02-01: 10 mg via INTRAVENOUS

## 2023-02-01 MED ORDER — EPHEDRINE SULFATE-NACL 50-0.9 MG/10ML-% IV SOSY
PREFILLED_SYRINGE | INTRAVENOUS | Status: DC | PRN
  Administered 2023-02-01 (×2): 10 mg via INTRAVENOUS

## 2023-02-01 MED ORDER — PROPOFOL 10 MG/ML IV BOLUS
INTRAVENOUS | Status: AC
  Filled 2023-02-01: qty 20

## 2023-02-01 MED ORDER — ORAL CARE MOUTH RINSE: 15.0000 mL | Freq: Once | OROMUCOSAL | Status: AC

## 2023-02-01 MED ORDER — HYDROMORPHONE HCL 1 MG/ML IJ SOLN
INTRAMUSCULAR | Status: AC
  Filled 2023-02-01: qty 0.5

## 2023-02-01 MED ORDER — LIDOCAINE 2% (20 MG/ML) 5 ML SYRINGE
INTRAMUSCULAR | Status: DC | PRN
  Administered 2023-02-01: 100 mg via INTRAVENOUS

## 2023-02-01 MED ORDER — SODIUM CHLORIDE 0.9 % IV SOLN: INTRAVENOUS | Status: DC | PRN

## 2023-02-01 MED ORDER — OXYCODONE HCL 5 MG/5ML PO SOLN: 5.0000 mg | Freq: Once | ORAL | Status: DC | PRN

## 2023-02-01 MED ORDER — CHLORHEXIDINE GLUCONATE CLOTH 2 % EX PADS: 6.0000 | MEDICATED_PAD | Freq: Once | CUTANEOUS | Status: DC

## 2023-02-01 MED ORDER — HYDROMORPHONE HCL 1 MG/ML IJ SOLN
INTRAMUSCULAR | Status: DC | PRN
  Administered 2023-02-01: .5 mg via INTRAVENOUS

## 2023-02-01 MED ORDER — DROPERIDOL 2.5 MG/ML IJ SOLN: 0.6250 mg | Freq: Once | INTRAMUSCULAR | Status: DC | PRN

## 2023-02-01 MED ORDER — INSULIN ASPART 100 UNIT/ML IJ SOLN
INTRAMUSCULAR | Status: AC
  Administered 2023-02-01: 2 [IU] via SUBCUTANEOUS
  Filled 2023-02-01: qty 1

## 2023-02-01 MED ORDER — INSULIN ASPART 100 UNIT/ML IJ SOLN: 0.0000 [IU] | INTRAMUSCULAR | Status: DC | PRN

## 2023-02-01 MED ORDER — DEXAMETHASONE SODIUM PHOSPHATE 10 MG/ML IJ SOLN
INTRAMUSCULAR | Status: AC
  Filled 2023-02-01: qty 1

## 2023-02-01 MED ORDER — PROPOFOL 10 MG/ML IV BOLUS
INTRAVENOUS | Status: DC | PRN
  Administered 2023-02-01: 200 mg via INTRAVENOUS

## 2023-02-01 MED ORDER — OXYCODONE HCL 5 MG PO TABS: 5.0000 mg | ORAL_TABLET | Freq: Once | ORAL | Status: DC | PRN

## 2023-02-01 MED ORDER — PHENYLEPHRINE 80 MCG/ML (10ML) SYRINGE FOR IV PUSH (FOR BLOOD PRESSURE SUPPORT)
PREFILLED_SYRINGE | INTRAVENOUS | Status: AC
  Filled 2023-02-01: qty 10

## 2023-02-01 MED ORDER — CEFAZOLIN SODIUM-DEXTROSE 2-4 GM/100ML-% IV SOLN
2.0000 g | INTRAVENOUS | Status: AC
  Administered 2023-02-01: 2 g via INTRAVENOUS
  Filled 2023-02-01: qty 100

## 2023-02-01 MED ORDER — MIDAZOLAM HCL 2 MG/2ML IJ SOLN
INTRAMUSCULAR | Status: DC | PRN
  Administered 2023-02-01: 2 mg via INTRAVENOUS

## 2023-02-01 SURGICAL SUPPLY — 34 items
APPLIER CLIP 9.375 MED OPEN (MISCELLANEOUS) IMPLANT
BAG COUNTER SPONGE SURGICOUNT (BAG) ×1 IMPLANT
BENZOIN TINCTURE PRP APPL 2/3 (GAUZE/BANDAGES/DRESSINGS) ×1 IMPLANT
CANISTER SUCT 3000ML PPV (MISCELLANEOUS) ×1 IMPLANT
CHLORAPREP W/TINT 26 (MISCELLANEOUS) ×1 IMPLANT
CLIP APPLIE 9.375 MED OPEN (MISCELLANEOUS) IMPLANT
COVER PROBE W GEL 5X96 (DRAPES) ×1 IMPLANT
COVER SURGICAL LIGHT HANDLE (MISCELLANEOUS) ×1 IMPLANT
DEVICE DUBIN SPECIMEN MAMMOGRA (MISCELLANEOUS) ×1 IMPLANT
DRAPE CHEST BREAST 15X10 FENES (DRAPES) ×1 IMPLANT
DRSG TEGADERM 4X4.75 (GAUZE/BANDAGES/DRESSINGS) ×1 IMPLANT
ELECT CAUTERY BLADE 6.4 (BLADE) ×1 IMPLANT
ELECT REM PT RETURN 9FT ADLT (ELECTROSURGICAL) ×1 IMPLANT
ELECTRODE REM PT RTRN 9FT ADLT (ELECTROSURGICAL) ×1 IMPLANT
GAUZE SPONGE 2X2 8PLY STRL LF (GAUZE/BANDAGES/DRESSINGS) ×1 IMPLANT
GLOVE BIO SURGEON STRL SZ7 (GLOVE) ×1 IMPLANT
GLOVE BIOGEL PI IND STRL 7.5 (GLOVE) ×1 IMPLANT
GOWN STRL REUS W/ TWL LRG LVL3 (GOWN DISPOSABLE) ×2 IMPLANT
ILLUMINATOR WAVEGUIDE N/F (MISCELLANEOUS) IMPLANT
KIT BASIN OR (CUSTOM PROCEDURE TRAY) ×1 IMPLANT
KIT MARKER MARGIN INK (KITS) ×1 IMPLANT
LIGHT WAVEGUIDE WIDE FLAT (MISCELLANEOUS) IMPLANT
NDL HYPO 25GX1X1/2 BEV (NEEDLE) ×1 IMPLANT
NEEDLE HYPO 25GX1X1/2 BEV (NEEDLE) ×1 IMPLANT
NS IRRIG 1000ML POUR BTL (IV SOLUTION) ×1 IMPLANT
PACK GENERAL/GYN (CUSTOM PROCEDURE TRAY) ×1 IMPLANT
SPONGE T-LAP 4X18 ~~LOC~~+RFID (SPONGE) ×1 IMPLANT
STRIP CLOSURE SKIN 1/2X4 (GAUZE/BANDAGES/DRESSINGS) ×1 IMPLANT
SUT MNCRL AB 4-0 PS2 18 (SUTURE) ×1 IMPLANT
SUT SILK 2 0 SH (SUTURE) IMPLANT
SUT VIC AB 3-0 SH 27X BRD (SUTURE) ×1 IMPLANT
SYR CONTROL 10ML LL (SYRINGE) ×1 IMPLANT
TOWEL GREEN STERILE (TOWEL DISPOSABLE) ×1 IMPLANT
TOWEL GREEN STERILE FF (TOWEL DISPOSABLE) ×1 IMPLANT

## 2023-02-01 NOTE — Discharge Instructions (Signed)

## 2023-02-01 NOTE — Transfer of Care (Signed)
mmediate Anesthesia Transfer of Care Note  Patient: Janet [name]  Procedure(s) Performed: LEFT BREAST RADIOACTIVE SEED LOCALIZED LUMPECTOMY (Left)  Patient Location: PACU  Anesthesia Type:General  Level of Consciousness: sedated and responds to stimulation  Airway & Oxygen Therapy: Patient Spontanous Breathing and Patient connected to nasal cannula oxygen  Post-op Assessment: Report given to RN and Post -op Vital signs reviewed and stable  Post vital signs: Reviewed and stable  Last Vitals:  Vitals Value Taken Time  BP 115/72 02/01/23 0834  Temp 98.1   Pulse 76 02/01/23 0837  Resp 13 02/01/23 0837  SpO2 94 % 02/01/23 0837  Vitals shown include unfiled device data.  Last Pain:  Vitals:   02/01/23 0557  TempSrc:   PainSc: 0-No pain         Complications: No notable events documented.

## 2023-02-01 NOTE — Anesthesia Postprocedure Evaluation (Signed)
Anesthesia Post Note  Patient: Janet [name]  Procedure(s) Performed: LEFT BREAST RADIOACTIVE SEED LOCALIZED LUMPECTOMY (Left)     Patient location during evaluation: PACU Anesthesia Type: General Level of consciousness: awake and alert Pain management: pain level controlled Vital Signs Assessment: post-procedure vital signs reviewed and stable Respiratory status: spontaneous breathing, nonlabored ventilation and respiratory function stable Cardiovascular status: blood pressure returned to baseline Postop Assessment: no apparent nausea or vomiting Anesthetic complications: no   No notable events documented.  Last Vitals:  Vitals:   02/01/23 0900 02/01/23 0907  BP: 114/72 109/65  Pulse: 77 74  Resp: 11 14  Temp:  36.5 C  SpO2: 92% 94%    Last Pain:  Vitals:   02/01/23 0834  TempSrc:   PainSc: 0-No pain                 Shanda Howells

## 2023-02-01 NOTE — Op Note (Signed)
Pre-op Diagnosis:  Left breast mass Post-op Diagnosis: same Procedure:  Left breast radioactive seed localized excisional biopsy Surgeon:  Mohsin Crum K. Anesthesia:  GEN - LMA Indications:  This is a 57 year old female who I previously performed a laparoscopic appendectomy on in 2010.  Recently she underwent routine screening mammogram which showed a possible mass in the lateral left breast.  She underwent diagnostic mammogram and ultrasound that revealed a 1.6 x 0.5 x 1.2 cm mass located at 3:00 in the left breast, 11 cm from the nipple.  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  No family history of breast cancer.  The patient had a previous left breast biopsy 3 years ago that revealed papillary apocrine metaplasia.  No previous breast surgeries.   The patient has atrial fibrillation and is anticoagulated on Eliquis. Description of procedure: The patient is brought to the operating room placed in supine position on the operating room table. After an adequate level of general anesthesia was obtained, her left breast was prepped with ChloraPrep and draped in sterile fashion. A timeout was taken to ensure the proper patient and proper procedure. We interrogated the breast with the neoprobe. We made a transverse incision in the lateral part of the breast after infiltrating with 0.25% Marcaine. Dissection was carried down in the breast tissue with cautery. We used the neoprobe to guide [place] towards the radioactive seed. We excised an area of tissue around the radioactive seed 2 cm in diameter. The specimen was removed and was oriented with a paint kit. Specimen mammogram showed the radioactive seed as well as the biopsy clip within the specimen. This was sent for pathologic examination. There is no residual radioactivity within the biopsy cavity. We inspected carefully for hemostasis. The wound was thoroughly irrigated. The wound was closed with a deep layer of 3-0  Vicryl and a subcuticular layer of 4-0 Monocryl. Benzoin Steri-Strips were applied. The patient was then extubated and brought to the recovery room in stable condition. All sponge, instrument, and needle counts are correct.  Wilmon Arms. Corliss Skains, MD, [facility]  General/ Trauma Surgery  02/01/2023 8:35 AM

## 2023-02-01 NOTE — H&P (Signed)
Subjective    Chief Complaint: New Consultation (LEFT BENIGN BIPHASIC FIBROEPITHELIAL PROCESS,)       History of Present Illness: Janet [name] is a 57 y.o. female who is seen today as an office consultation at the request of Dr. Kateri Plummer for evaluation of New Consultation (LEFT BENIGN BIPHASIC FIBROEPITHELIAL PROCESS,) .     Cardiology - Lynnette Caffey   This is a 57 year old female who I previously performed a laparoscopic appendectomy on in 2010.  Recently she underwent routine screening mammogram which showed a possible mass in the lateral left breast.  She underwent diagnostic mammogram and ultrasound that revealed a 1.6 x 0.5 x 1.2 cm mass located at 3:00 in the left breast, 11 cm from the nipple.  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  No family history of breast cancer.  The patient had a previous left breast biopsy 3 years ago that revealed papillary apocrine metaplasia.  No previous breast surgeries.   The patient has atrial fibrillation and is anticoagulated on Eliquis.     Review of Systems: A complete review of systems was obtained from the patient.  I have reviewed this information and discussed as appropriate with the patient.  See HPI as well for other ROS.   Review of Systems  Constitutional: Negative.   HENT: Negative.    Eyes: Negative.   Respiratory: Negative.    Cardiovascular: Negative.   Gastrointestinal: Negative.   Genitourinary: Negative.   Musculoskeletal: Negative.   Skin: Negative.   Neurological: Negative.   Endo/Heme/Allergies: Negative.   Psychiatric/Behavioral: Negative.          Medical History: Past Medical History         Past Medical History:  Diagnosis Date   Hypertension     Sleep apnea          Problem List       Patient Active Problem List  Diagnosis   Acute pancreatitis (HHS-HCC)   Colon polyps   DM (diabetes mellitus) (CMS/HHS-HCC)   Goiter, nontoxic, multinodular   Hemorrhoids    Hyperlipidemia   Hypertension   Hypokalemia   Obesity, Class III, BMI 40-49.9 (morbid obesity) (CMS/HHS-HCC)   OSA (obstructive sleep apnea)   Mass of upper outer quadrant of left breast        Past Surgical History           Past Surgical History:  Procedure Laterality Date   APPENDECTOMY       HYSTERECTOMY       REPEAT CESAREAN SECTION            Allergies         Allergies  Allergen Reactions   Penicillins Rash        Medications Ordered Prior to Encounter             Current Outpatient Medications on File Prior to Visit  Medication Sig Dispense Refill   amLODIPine (NORVASC) 5 MG tablet Take 5 mg by mouth once daily       ELIQUIS 5 mg tablet Take 5 mg by mouth 2 (two) times daily       hydroCHLOROthiazide (MICROZIDE) 12.5 mg capsule Take 12.5 mg by mouth       metFORMIN (GLUCOPHAGE-XR) 500 MG XR tablet Take by mouth       metoprolol succinate (TOPROL-XL) 25 MG XL tablet Take 25 mg by mouth once daily       potassium chloride (KLOR-CON) 20 mEq packet  rosuvastatin (CRESTOR) 10 MG tablet Take 10 mg by mouth once daily        No current facility-administered medications on file prior to visit.        Family History           Family History  Problem Relation Age of Onset   Obesity Mother     Diabetes Mother     High blood pressure (Hypertension) Father     Hyperlipidemia (Elevated cholesterol) Father     Obesity Sister     High blood pressure (Hypertension) Brother     Hyperlipidemia (Elevated cholesterol) Brother          Tobacco Use History  Social History         Tobacco Use  Smoking Status Never  Smokeless Tobacco Never        Social History  Social History           Socioeconomic History   Marital status: Divorced  Tobacco Use   Smoking status: Never   Smokeless tobacco: Never  Substance and Sexual Activity   Alcohol use: Not Currently   Drug use: Never    Social Drivers of Health      Received from [employer]    Social  Network        Objective:           Vitals:    12/18/22 0946  BP: 110/72  Pulse: 86  Temp: 36.7 C (98 F)  SpO2: 99%  Weight: (!) 107 kg (236 lb)  Height: 162.6 cm (5\' 4" )    Body mass index is 40.51 kg/m.   Physical Exam    Constitutional:  WDWN in NAD, conversant, no obvious deformities; lying in bed comfortably Eyes:  Pupils equal, round; sclera anicteric; moist conjunctiva; no lid lag HENT:  Oral mucosa moist; good dentition  Neck:  No masses palpated, trachea midline; no thyromegaly Lungs:  CTA bilaterally; normal respiratory effort Breasts:  symmetric, no nipple changes; no palpable masses or lymphadenopathy on either side CV:  Regular rate and rhythm; no murmurs; extremities well-perfused with no edema Abd:  +bowel sounds, soft, non-tender, no palpable organomegaly; no palpable hernias; healed laparoscopic incisions Musc: Normal gait; no apparent clubbing or cyanosis in extremities Lymphatic:  No palpable cervical or axillary lymphadenopathy Skin:  Warm, dry; no sign of jaundice Psychiatric - alert and oriented x 4; calm mood and affect     Labs, Imaging and Diagnostic Testing: FINAL DIAGNOSIS        1. Breast, left, needle core biopsy, 3:00 11 cmfn :       - BENIGN BIPHASIC FIBROEPITHELIAL PROCESS, SEE NOTE       - NEGATIVE FOR CARCINOMA        Diagnosis Note : A fibroadenoma is favored but differential diagnosis can       include a benign Phyllodes tumor.       DATE SIGNED OUT: 11/28/2022  ELECTRONIC SIGNATURE : Kenard Gower Md, Nilesh, Pathologist, Electronic Signature    CLINICAL DATA:  Patient was recalled from screening mammogram for a possible left breast mass.   EXAM: DIGITAL DIAGNOSTIC UNILATERAL LEFT MAMMOGRAM WITH TOMOSYNTHESIS AND CAD; ULTRASOUND LEFT BREAST LIMITED   TECHNIQUE: Left digital diagnostic mammography and breast tomosynthesis was performed. The images were evaluated with computer-aided detection. ; Targeted ultrasound  examination of the left breast was performed.   COMPARISON:  Previous exam(s).   ACR Breast Density Category b: There are scattered areas of fibroglandular density.  FINDINGS: Additional imaging of the left breast was performed. There is persistence of a circumscribed mass in the lateral aspect of the breast. There are no malignant type microcalcifications.   On physical exam, I do not palpate a mass in the lateral aspect of the left breast.   Targeted ultrasound is performed, showing a well-circumscribed hypoechoic mass in the left breast at 3 o'clock 11 cm from the nipple measuring 1.6 x 0.5 x 1.2 cm. Sonographic evaluation the left axilla does not show any enlarged adenopathy.   IMPRESSION: Indeterminate 1.6 cm mass in the 3 o'clock region of the left breast 11 cm from the nipple.   RECOMMENDATION: Ultrasound-guided core biopsy of the mass in the 3 o'clock region of the left breast is recommended.   I have discussed the findings and recommendations with the patient. If applicable, a reminder letter will be sent to the patient regarding the next appointment.   BI-RADS CATEGORY  4: Suspicious.     Electronically Signed   By: Baird Lyons M.D.   On: 11/24/2022 12:26 Assessment and Plan:  Diagnoses and all orders for this visit:   Mass of upper outer quadrant of left breast     Recommend left breast radioactive seed localized lumpectomy.The surgical procedure has been discussed with the patient.  Potential risks, benefits, alternative treatments, and expected outcomes have been explained.  All of the patient's questions at this time have been answered.  The likelihood of reaching the patient's treatment goal is good.  The patient understands the proposed surgical procedure and wishes to proceed.   Wilmon Arms. Corliss Skains, MD, [facility]  General Surgery   02/01/2023 7:20 AM

## 2023-02-02 ENCOUNTER — Encounter (HOSPITAL_COMMUNITY): Payer: Self-pay | Admitting: Surgery

## 2023-02-05 LAB — SURGICAL PATHOLOGY

## 2023-02-05 NOTE — Progress Notes (Signed)
Cardiology Office Note:   Date:  02/12/2023  ID:  Janet [name], DOB 06/09/1965, MRN [id number] PCP:  Farris Has, MD  [facility] Providers Cardiologist:  Alverda Skeans, MD Referring MD: Jarrett Soho, PA-C  Chief Complaint/Reason for Referral: Cardiology follow-up ASSESSMENT:    1. Paroxysmal atrial fibrillation (HCC)   2. Secondary hypercoagulable state (HCC)   3. Type 2 diabetes mellitus with complication, without long-term current use of insulin (HCC)   4. Hypertension associated with diabetes (HCC)   5. Hyperlipidemia associated with type 2 diabetes mellitus (HCC)   6. BMI 40.0-44.9, adult (HCC)   7. Stage 3a chronic kidney disease (HCC)     PLAN:   In order of problems listed above: Paroxysmal atrial fibrillation: Continue Eliquis and Toprol. Secondary hypercoagulable state: Continue Eliquis. Type 2 diabetes mellitus: Continue Eliquis, rosuvastatin; start Micardis 20 mg given development of CKD stage IIIa.  Will rerefer to pharmacy regarding GLP-1 receptor agonist therapy; start Jardiance 10mg  qday. Hypertension: BP above goal of 130/80; start Micardis 20 mg daily.  Stop amlodipine.  Check labs next week. Hyperlipidemia: Check lipid panel LFTs next week.  Given elevated LP(a) levels we will target an LDL of less than 55. Elevated BMI: Given new clinical trial data, will refer back to pharmacy to discuss GLP-1 receptor agonist therapy. CKD stage IIIa: Given the development of chronic kidney disease with decreased GFR we will start Micardis and Jardiance for renal protection as above.            Dispo:  Return in about 6 months (around 08/13/2023).      Medication Adjustments/Labs and Tests Ordered: Current medicines are reviewed at length with the patient today.  Concerns regarding medicines are outlined above.  The following changes have been made:     Labs/tests ordered: Orders Placed This Encounter  Procedures   Lipid panel   Basic metabolic panel    Hepatic function panel   AMB Referral to [facility] Pharm-D    Medication Changes: Meds ordered this encounter  Medications   telmisartan (MICARDIS) 20 MG tablet    Sig: Take 1 tablet (20 mg total) by mouth daily.    Dispense:  90 tablet    Refill:  3    Stop amlodipine   DISCONTD: empagliflozin (JARDIANCE) 10 MG TABS tablet    Sig: Take 1 tablet (10 mg total) by mouth daily before breakfast.    Dispense:  30 tablet    Refill:  5   empagliflozin (JARDIANCE) 10 MG TABS tablet    Sig: Take 1 tablet (10 mg total) by mouth daily before breakfast.    Dispense:  90 tablet    Refill:  3    Replace the previous prescription of 30 day supply.  Pt requested 90 day supply.  Thank you!    Current medicines are reviewed at length with the patient today.  The patient does not have concerns regarding medicines.  I spent 32 minutes reviewing all clinical data during and prior to this visit including all relevant imaging studies, laboratories, clinical information from other health systems and prior notes from both Cardiology and other specialties, interviewing the patient, conducting a complete physical examination, and coordinating care in order to formulate a comprehensive and personalized evaluation and treatment plan.   History of Present Illness:      FOCUSED PROBLEM LIST:   PAF CV 2 score of 3 On Eliquis Type 2 diabetes mellitus On metformin Hypertension Hyperlipidemia LP(a) 230 BMI 42 CKD stage IIIa Pancreatitis  Believed due to lisinopril OSA On CPAP  1/24:  The patient is a 57 y.o. female with the indicated medical history here to establish general cardiovascular care.  The patient had been previously followed by the atrial fibrillation clinic but she desired to change due to cost.  She has been doing fairly well.  She denies any chest pain, palpitations, paroxysmal nocturnal dyspnea, bleeding, orthopnea, or significant peripheral edema.  She does not smoke.  She is switched  from a high stress job which exacerbated atrial fibrillation back in April to a lower stress job.   On review of her medications she is not on lisinopril or an ARB.  When asked about this the patient tells me that she developed pancreatitis which was thought to be attributed to lisinopril even though she had been taking this for several years.  Because of this she was eventually started on Norvasc and hydrochlorothiazide to help control her blood pressure.  She has been on metoprolol due to a history of bigeminy.  Also offered Ozempic but due to potential risk of pancreatitis she elected to defer this.  Plan: Increase Crestor to 20 mg and check lipid panel LFTs, LP(a) in 2 months; refer to pharmacy to discuss SGLT2 inhibitor, ARB, and GLP-1 receptor agonist.  12/24: The patient saw pharmacy.  At that point in time due to no evidence of CKD a change to an ARB was not pursued especially in light of low urinary protein.  In the interim she did have blood work drawn which demonstrated the development of CKD stage IIIa GFR of 57.  The patient has been doing well from a palpitation standpoint.  She feels that her palpitations are much better controlled.  She denies any cardiovascular symptoms such as chest pain or shortness of breath.  She has had no recurrent signs or symptoms of pancreatitis.  She is tolerating her medications well including her rosuvastatin.  She did have surgery to remove a lump in her breast recently which was uncomplicated.  Additionally she started CPAP for obstructive sleep apnea.  She has spoken to her her PCP about starting Ozempic but that provider was concerned about her previous history of pancreatitis.  She is quite interested in pursuing this medication if clinically indicated.          Current Medications: Current Meds  Medication Sig   apixaban (ELIQUIS) 5 MG TABS tablet Take 1 tablet (5 mg total) by mouth 2 (two) times daily.   cholecalciferol (VITAMIN D3) 25 MCG (1000  UNIT) tablet Take 2,000 Units by mouth daily.   diltiazem (CARDIZEM) 30 MG tablet Take 1 tablet by mouth every 4 hours as needed for heart rate >100   hydrochlorothiazide (MICROZIDE) 12.5 MG capsule Take 1 capsule (12.5 mg total) by mouth in the morning.   metFORMIN (GLUCOPHAGE-XR) 500 MG 24 hr tablet Take 1 tablet (500 mg total) by mouth at bedtime. (Patient taking differently: Take 500 mg by mouth 2 (two) times daily.)   metoprolol succinate (TOPROL-XL) 50 MG 24 hr tablet Take 1 tablet by mouth once daily   metoprolol succinate (TOPROL-XL) 50 MG 24 hr tablet Take 1 tablet by mouth once a day   potassium chloride SA (KLOR-CON M) 20 MEQ tablet Take 1 tablet (20 mEq total) by mouth daily. (Patient taking differently: Take 20 mEq by mouth every 3 (three) days.)   rosuvastatin (CRESTOR) 40 MG tablet Take 1 tablet (40 mg total) by mouth daily.   telmisartan (MICARDIS) 20 MG tablet Take  1 tablet (20 mg total) by mouth daily.   [DISCONTINUED] amLODipine (NORVASC) 5 MG tablet Take 1 tablet (5 mg total) by mouth daily.   [DISCONTINUED] empagliflozin (JARDIANCE) 10 MG TABS tablet Take 1 tablet (10 mg total) by mouth daily before breakfast.     Review of Systems:   Please see the history of present illness.    All other systems reviewed and are negative.     EKGs/Labs/Other Test Reviewed:   EKG: EKG from January 2024 demonstrates sinus rhythm  EKG Interpretation Date/Time:    Ventricular Rate:    PR Interval:    QRS Duration:    QT Interval:    QTC Calculation:   R Axis:      Text Interpretation:           Risk Assessment/Calculations:    CHA2DS2-VASc Score = 3   This indicates a 3.2% annual risk of stroke. The patient's score is based upon: CHF History: 0 HTN History: 1 Diabetes History: 1 Stroke History: 0 Vascular Disease History: 0 Age Score: 0 Gender Score: 1         Physical Exam:   VS:  BP 124/84 (BP Location: Right Arm, Patient Position: Sitting, Cuff Size: Large)    Pulse 68   Ht 5\' 4"  (1.626 m)   Wt 236 lb 9.6 oz (107.3 kg)   LMP 07/22/2015   SpO2 98%   BMI 40.61 kg/m        Wt Readings from Last 3 Encounters:  02/12/23 236 lb 9.6 oz (107.3 kg)  02/01/23 239 lb (108.4 kg)  01/30/23 239 lb 14.4 oz (108.8 kg)      GENERAL:  No apparent distress, AOx3 HEENT:  No carotid bruits, +2 carotid impulses, no scleral icterus CAR: RRR no murmurs, gallops, rubs, or thrills RES:  Clear to auscultation bilaterally ABD:  Soft, nontender, nondistended, positive bowel sounds x 4 VASC:  +2 radial pulses, +2 carotid pulses NEURO:  CN 2-12 grossly intact; motor and sensory grossly intact PSYCH:  No active depression or anxiety EXT:  No edema, ecchymosis, or cyanosis  Signed, Orbie Pyo, MD  02/12/2023 9:39 AM    [facility] [address] [place], [place], [place]  [address] Phone: [phone]; Fax: [phone]   Note:  This document was prepared using Dragon voice recognition software and may include unintentional dictation errors.

## 2023-02-12 ENCOUNTER — Encounter: Payer: Self-pay | Admitting: Internal Medicine

## 2023-02-12 ENCOUNTER — Ambulatory Visit: Payer: BC Managed Care – PPO | Attending: Internal Medicine | Admitting: Internal Medicine

## 2023-02-12 VITALS — BP 124/84 | HR 68 | Ht 64.0 in | Wt 236.6 lb

## 2023-02-12 DIAGNOSIS — I480 Paroxysmal atrial fibrillation: Secondary | ICD-10-CM

## 2023-02-12 DIAGNOSIS — D6869 Other thrombophilia: Secondary | ICD-10-CM | POA: Diagnosis not present

## 2023-02-12 DIAGNOSIS — I152 Hypertension secondary to endocrine disorders: Secondary | ICD-10-CM

## 2023-02-12 DIAGNOSIS — E1169 Type 2 diabetes mellitus with other specified complication: Secondary | ICD-10-CM

## 2023-02-12 DIAGNOSIS — Z6841 Body Mass Index (BMI) 40.0 and over, adult: Secondary | ICD-10-CM

## 2023-02-12 DIAGNOSIS — E1159 Type 2 diabetes mellitus with other circulatory complications: Secondary | ICD-10-CM | POA: Diagnosis not present

## 2023-02-12 DIAGNOSIS — E118 Type 2 diabetes mellitus with unspecified complications: Secondary | ICD-10-CM | POA: Diagnosis not present

## 2023-02-12 DIAGNOSIS — E785 Hyperlipidemia, unspecified: Secondary | ICD-10-CM

## 2023-02-12 DIAGNOSIS — N1831 Chronic kidney disease, stage 3a: Secondary | ICD-10-CM

## 2023-02-12 MED ORDER — EMPAGLIFLOZIN 10 MG PO TABS: 10.0000 mg | ORAL_TABLET | Freq: Every day | ORAL | 3 refills | Status: DC

## 2023-02-12 MED ORDER — TELMISARTAN 20 MG PO TABS: 20.0000 mg | ORAL_TABLET | Freq: Every day | ORAL | 3 refills | Status: DC

## 2023-02-12 MED ORDER — EMPAGLIFLOZIN 10 MG PO TABS: 10.0000 mg | ORAL_TABLET | Freq: Every day | ORAL | 5 refills | Status: DC

## 2023-02-12 NOTE — Patient Instructions (Addendum)
Medication Instructions:  Your physician has recommended you make the following change in your medication:  1.) stop amlodipine 2.) start telmisartan (Micardis) 20 mg - take one tablet daily 3.) start Jardiance 10 mg - take one tablet daily  *If you need a refill on your cardiac medications before your next appointment, please call your pharmacy*   Lab Work: Please go to [facility] in 7-10 days for blood work (lipids, liver function and basic metabolic panel)  If you have labs (blood work) drawn today and your tests are completely normal, you will receive your results only by: [employer] (if you have MyChart) OR A paper copy in the mail If you have any lab test that is abnormal or we need to change your treatment, we will call you to review the results.   Testing/Procedures: none   Follow-Up: At [facility], you and your health needs are our priority.  As part of our continuing mission to provide you with exceptional heart care, we have created designated Provider Care Teams.  These Care Teams include your primary Cardiologist (physician) and Advanced Practice Providers (APPs -  Physician Assistants and Nurse Practitioners) who all work together to provide you with the care you need, when you need it.   Your next appointment:   6 month(s)  Provider:   Robin Searing, NP       Other Instructions You have been referred to clinical pharmacy team for Ozempic discussion

## 2023-02-18 DIAGNOSIS — G4733 Obstructive sleep apnea (adult) (pediatric): Secondary | ICD-10-CM | POA: Diagnosis not present

## 2023-02-19 ENCOUNTER — Encounter: Payer: Self-pay | Admitting: Oncology

## 2023-02-19 ENCOUNTER — Inpatient Hospital Stay: Payer: BC Managed Care – PPO

## 2023-02-19 ENCOUNTER — Inpatient Hospital Stay: Payer: BC Managed Care – PPO | Attending: Oncology | Admitting: Oncology

## 2023-02-19 VITALS — BP 113/71 | HR 77 | Temp 98.1°F | Resp 18 | Ht 64.0 in | Wt 237.3 lb

## 2023-02-19 DIAGNOSIS — Z79899 Other long term (current) drug therapy: Secondary | ICD-10-CM | POA: Diagnosis not present

## 2023-02-19 DIAGNOSIS — I10 Essential (primary) hypertension: Secondary | ICD-10-CM | POA: Insufficient documentation

## 2023-02-19 DIAGNOSIS — K649 Unspecified hemorrhoids: Secondary | ICD-10-CM

## 2023-02-19 DIAGNOSIS — E611 Iron deficiency: Secondary | ICD-10-CM | POA: Diagnosis not present

## 2023-02-19 DIAGNOSIS — E1159 Type 2 diabetes mellitus with other circulatory complications: Secondary | ICD-10-CM | POA: Diagnosis not present

## 2023-02-19 DIAGNOSIS — G4733 Obstructive sleep apnea (adult) (pediatric): Secondary | ICD-10-CM | POA: Diagnosis not present

## 2023-02-19 DIAGNOSIS — I4891 Unspecified atrial fibrillation: Secondary | ICD-10-CM | POA: Insufficient documentation

## 2023-02-19 DIAGNOSIS — Z7901 Long term (current) use of anticoagulants: Secondary | ICD-10-CM | POA: Insufficient documentation

## 2023-02-19 DIAGNOSIS — Z9989 Dependence on other enabling machines and devices: Secondary | ICD-10-CM | POA: Insufficient documentation

## 2023-02-19 DIAGNOSIS — K648 Other hemorrhoids: Secondary | ICD-10-CM | POA: Insufficient documentation

## 2023-02-19 LAB — CBC WITH DIFFERENTIAL/PLATELET
Abs Immature Granulocytes: 0.02 10*3/uL (ref 0.00–0.07)
Basophils Absolute: 0.1 10*3/uL (ref 0.0–0.1)
Basophils Relative: 1 %
Eosinophils Absolute: 0.2 10*3/uL (ref 0.0–0.5)
Eosinophils Relative: 2 %
HCT: 39.4 % (ref 36.0–46.0)
Hemoglobin: 13.1 g/dL (ref 12.0–15.0)
Immature Granulocytes: 0 %
Lymphocytes Relative: 23 %
Lymphs Abs: 2.4 10*3/uL (ref 0.7–4.0)
MCH: 27.8 pg (ref 26.0–34.0)
MCHC: 33.2 g/dL (ref 30.0–36.0)
MCV: 83.7 fL (ref 80.0–100.0)
Monocytes Absolute: 0.6 10*3/uL (ref 0.1–1.0)
Monocytes Relative: 5 %
Neutro Abs: 6.9 10*3/uL (ref 1.7–7.7)
Neutrophils Relative %: 69 %
Platelets: 374 10*3/uL (ref 150–400)
RBC: 4.71 MIL/uL (ref 3.87–5.11)
RDW: 15 % (ref 11.5–15.5)
WBC: 10.1 10*3/uL (ref 4.0–10.5)
nRBC: 0 % (ref 0.0–0.2)

## 2023-02-19 LAB — IRON AND TIBC
Iron: 75 ug/dL (ref 28–170)
Saturation Ratios: 18 % (ref 10.4–31.8)
TIBC: 423 ug/dL (ref 250–450)
UIBC: 348 ug/dL

## 2023-02-19 LAB — COMPREHENSIVE METABOLIC PANEL
ALT: 17 U/L (ref 0–44)
AST: 20 U/L (ref 15–41)
Albumin: 4.6 g/dL (ref 3.5–5.0)
Alkaline Phosphatase: 119 U/L (ref 38–126)
Anion gap: 10 (ref 5–15)
BUN: 16 mg/dL (ref 6–20)
CO2: 29 mmol/L (ref 22–32)
Calcium: 9.9 mg/dL (ref 8.9–10.3)
Chloride: 97 mmol/L — ABNORMAL LOW (ref 98–111)
Creatinine, Ser: 0.93 mg/dL (ref 0.44–1.00)
GFR, Estimated: >60 mL/min (ref >60)
Glucose, Bld: 112 mg/dL — ABNORMAL HIGH (ref 70–99)
Potassium: 4.1 mmol/L (ref 3.5–5.1)
Sodium: 136 mmol/L (ref 135–145)
Total Bilirubin: 0.6 mg/dL (ref 0.0–1.2)
Total Protein: 8.2 g/dL — ABNORMAL HIGH (ref 6.5–8.1)

## 2023-02-19 LAB — FERRITIN: Ferritin: 22 ng/mL (ref 11–307)

## 2023-02-19 NOTE — Assessment & Plan Note (Signed)
Low iron levels noted on recent labs, no history of iron supplementation. No anemia present. Possible contributing factors include dietary intake and chronic hemorrhoidal bleeding. No other signs of bleeding noted.  -CBC today is unremarkable with normal differential.  Hemoglobin 13.1.  CMP also unremarkable.  Ferritin is normal at 22.  Iron studies pending.  -If iron levels remain low, prescribe oral iron supplement to be taken once daily with vitamin C for improved absorption.  -We will consider IV iron if iron stores are significantly low and patient reports persistent symptoms of fatigue.  -Patient was provided reassurance.  Plan to reevaluate in approximately 3 months.

## 2023-02-19 NOTE — Assessment & Plan Note (Signed)
Chronic internal and external hemorrhoids causing pain and intermittent bleeding. Increased bleeding noted with aspirin and Eliquis use. -Recommend over-the-counter treatments such as Preparation H cream and pads. -Consider referral to gastroenterology if symptoms persist or worsen.
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
## 2023-02-20 DIAGNOSIS — N1831 Chronic kidney disease, stage 3a: Secondary | ICD-10-CM | POA: Diagnosis not present

## 2023-02-20 DIAGNOSIS — E785 Hyperlipidemia, unspecified: Secondary | ICD-10-CM | POA: Diagnosis not present

## 2023-02-20 DIAGNOSIS — E1169 Type 2 diabetes mellitus with other specified complication: Secondary | ICD-10-CM | POA: Diagnosis not present

## 2023-02-21 LAB — BASIC METABOLIC PANEL
BUN/Creatinine Ratio: 17 (ref 9–23)
BUN: 16 mg/dL (ref 6–24)
CO2: 21 mmol/L (ref 20–29)
Calcium: 9.5 mg/dL (ref 8.7–10.2)
Chloride: 99 mmol/L (ref 96–106)
Creatinine, Ser: 0.93 mg/dL (ref 0.57–1.00)
Glucose: 118 mg/dL — ABNORMAL HIGH (ref 70–99)
Potassium: 4.2 mmol/L (ref 3.5–5.2)
Sodium: 139 mmol/L (ref 134–144)
eGFR: 71 mL/min/{1.73_m2} (ref >59)

## 2023-02-21 LAB — HEPATIC FUNCTION PANEL
ALT: 16 [IU]/L (ref 0–32)
AST: 19 [IU]/L (ref 0–40)
Albumin: 4.2 g/dL (ref 3.8–4.9)
Alkaline Phosphatase: 150 [IU]/L — ABNORMAL HIGH (ref 44–121)
Bilirubin Total: 0.4 mg/dL (ref 0.0–1.2)
Bilirubin, Direct: 0.14 mg/dL (ref 0.00–0.40)
Total Protein: 7.1 g/dL (ref 6.0–8.5)

## 2023-02-21 LAB — LIPID PANEL
Chol/HDL Ratio: 2.6 {ratio} (ref 0.0–4.4)
Cholesterol, Total: 154 mg/dL (ref 100–199)
HDL: 59 mg/dL (ref >39)
LDL Chol Calc (NIH): 71 mg/dL (ref 0–99)
Triglycerides: 137 mg/dL (ref 0–149)
VLDL Cholesterol Cal: 24 mg/dL (ref 5–40)

## 2023-02-22 ENCOUNTER — Telehealth: Payer: Self-pay

## 2023-02-22 ENCOUNTER — Telehealth: Payer: Self-pay | Admitting: *Deleted

## 2023-02-22 DIAGNOSIS — E1169 Type 2 diabetes mellitus with other specified complication: Secondary | ICD-10-CM

## 2023-02-22 MED ORDER — EZETIMIBE 10 MG PO TABS: 10.0000 mg | ORAL_TABLET | Freq: Every day | ORAL | 3 refills | Status: DC

## 2023-02-22 NOTE — Telephone Encounter (Signed)
-----   Message from Orbie Pyo sent at 02/22/2023  6:00 AM EST ----- Add zetia 10mg , lipids and LFTs in 1 month.  If alkaline phosphatase is still elevated, will alert PCP

## 2023-02-22 NOTE — Telephone Encounter (Signed)
 Patient left VM asking to speak w/nurse regarding her 02/20/23 appointment. Message routed to Dr. Zenda Alpers nurse at [facility].

## 2023-02-22 NOTE — Telephone Encounter (Signed)
 Spoke with patient about recent lab work and provider recommendation - zetia sent in to [facility] and repeat lab work ordered. Patient will have labs completed the first week in Feb. No further needs at this time

## 2023-02-23 ENCOUNTER — Telehealth: Payer: Self-pay

## 2023-02-23 DIAGNOSIS — G4733 Obstructive sleep apnea (adult) (pediatric): Secondary | ICD-10-CM | POA: Diagnosis not present

## 2023-02-23 NOTE — Telephone Encounter (Signed)
 Spoke with patient, understood and no further questions about labs.

## 2023-02-23 NOTE — Telephone Encounter (Signed)
-----   Message from Janet [name] sent at 02/23/2023  3:46 PM EST ----- Please inform patient that her iron labs are within normal limits and she does not need iron supplementation.  Please let me know if any additional questions.  Thank you.

## 2023-02-26 ENCOUNTER — Telehealth: Payer: Self-pay

## 2023-02-26 ENCOUNTER — Institutional Professional Consult (permissible substitution): Payer: BC Managed Care – PPO | Admitting: Primary Care
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
## 2023-02-28 NOTE — Progress Notes (Signed)
   PROVIDER:  DONNICE DEWAYNE LIMA, MD  MRN: [id number] DOB: May 25, 1965 DATE OF ENCOUNTER: 02/28/2023 Interval History:   This is a 58 year old female who I previously performed a laparoscopic appendectomy on in 2010. Recently she underwent routine screening mammogram which showed a possible mass in the lateral left breast. She underwent diagnostic mammogram and ultrasound that revealed a 1.6 x 0.5 x 1.2 cm mass located at 3:00 in the left breast, 11 cm from the nipple. 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 No family history of breast cancer. The patient had a previous left breast biopsy 3 years ago that revealed papillary apocrine metaplasia. No previous breast surgeries.   On 02/01/2023, she underwent left breast radioactive seed localized excisional biopsy.  Pathology confirmed a 1.4 cm fibroadenoma with no sign of atypia or malignancy. Physical Examination:   Physical Exam   The lateral left breast incision is well-healed with no sign of infection.  No seroma.  Assessment and Plan:   Cindee Mclester is a 58 y.o. female who underwent left breast radioactive seed localized excisional biopsy on 02/01/2023.  Diagnoses and all orders for this visit:  Fibroadenoma, left     Continue annual mammograms  Return if symptoms worsen or fail to improve.   The plan was discussed in detail with the patient today, who expressed understanding.  The patient has my contact information, and understands to call me with any additional questions or concerns in the interval.  I would be happy to see the patient back sooner if the need arises.   MATTHEW KAI TSUEI, MD

## 2023-03-08 ENCOUNTER — Telehealth: Payer: Self-pay

## 2023-03-08 NOTE — Telephone Encounter (Signed)
Patient called to cancel appointment for labs, and office visit with Dr. Arlana Pouch on March 31st 2025. She did not wish to reschedule at this time.

## 2023-03-16 DIAGNOSIS — G4733 Obstructive sleep apnea (adult) (pediatric): Secondary | ICD-10-CM | POA: Diagnosis not present

## 2023-03-21 DIAGNOSIS — G4733 Obstructive sleep apnea (adult) (pediatric): Secondary | ICD-10-CM | POA: Diagnosis not present

## 2023-03-23 ENCOUNTER — Other Ambulatory Visit (HOSPITAL_COMMUNITY): Payer: Self-pay

## 2023-03-23 ENCOUNTER — Telehealth: Payer: Self-pay | Admitting: Pharmacy Technician

## 2023-03-23 ENCOUNTER — Ambulatory Visit: Payer: BC Managed Care – PPO | Attending: Cardiology | Admitting: Pharmacist

## 2023-03-23 VITALS — Wt 234.0 lb

## 2023-03-23 DIAGNOSIS — E119 Type 2 diabetes mellitus without complications: Secondary | ICD-10-CM

## 2023-03-23 DIAGNOSIS — I10 Essential (primary) hypertension: Secondary | ICD-10-CM

## 2023-03-23 NOTE — Telephone Encounter (Signed)
Pharmacy Patient Advocate Encounter   Received notification from Physician's Office that prior authorization for ozempic is required/requested.   Insurance verification completed.   The patient is insured through  [employer]  .   Per test claim: The current 03/23/23 day co-pay is, $0.00 one month .  No PA needed at this time. This test claim was processed through [facility]- copay amounts may vary at other pharmacies due to pharmacy/plan contracts, or as the patient moves through the different stages of their insurance plan.
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
## 2023-03-23 NOTE — Patient Instructions (Addendum)
GLP1 Agonist Titration Plan:  Will plan to follow the titration plan as below, pending patient is tolerating each dose before increasing to the next. Can slow titration if needed for tolerability.   Ozempic:  -Month 1: Inject Ozempic 0.25 mg SQ once weekly x 4 weeks -Month 2: Inject Ozempic 0.5 mg  SQ once weekly x 4 weeks -Month 3: Inject Ozempic 1 mg SQ once weekly x 4 weeks -Month 4+: Inject Ozempic 2 mg SQ once weekly (maybe)   GLP-1 Receptor Agonist Counseling Points This medication reduces your appetite and may make you feel fuller longer.  Stop eating when your body tells you that you are full. This will likely happen sooner than you are used to. Fried/greasy food and sweets may upset your stomach - minimize these as much as possible. Store your medication in the fridge until you are ready to use it. Inject your medication in the fatty tissue of your lower abdominal area (2 inches away from belly button) or upper outer thigh. Rotate injection sites. Common side effects include: nausea, diarrhea/constipation, and heartburn, and are more likely to occur if you overeat. Stop your injection for 7 days prior to surgical procedures requiring anesthesia.  Dosing schedule:  We will touch base with you monthly over the phone. The medication can be increased in monthly intervals depending on tolerability and efficacy.  Tips for success: Write down the reasons why you want to lose weight and post it in a place where you'll see it often.  Start small and work your way up. Keep in mind that it takes time to achieve goals, and small steps add up.  Any additional movements help to burn calories. Taking the stairs rather than the elevator and parking at the far end of your parking lot are easy ways to start. Brisk walking for at least 30 minutes 4 or more days of the week is an excellent goal to work toward  Understanding what it means to feel full: Did you know that it can take 15 minutes or  more for your brain to receive the message that you've eaten? That means that, if you eat less food, but consume it slower, you may still feel satisfied.  Eating a lot of fruits and vegetables can also help you feel fuller.  Eat off of smaller plates so that moderate portions don't seem too small  Tips for living a healthier life     Building a Healthy and Balanced Diet Make most of your meal vegetables and fruits -  of your plate. Aim for color and variety, and remember that potatoes don't count as vegetables on the Healthy Eating Plate because of their negative impact on blood sugar.  Go for whole grains -  of your plate. Whole and intact grains--whole wheat, barley, wheat berries, quinoa, oats, brown rice, and foods made with them, such as whole wheat pasta--have a milder effect on blood sugar and insulin than white bread, white rice, and other refined grains.  Protein power -  of your plate. Fish, poultry, beans, and nuts are all healthy, versatile protein sources--they can be mixed into salads, and pair well with vegetables on a plate. Limit red meat, and avoid processed meats such as bacon and sausage.  Healthy plant oils - in moderation. Choose healthy vegetable oils like olive, canola, soy, corn, sunflower, peanut, and others, and avoid partially hydrogenated oils, which contain unhealthy trans fats. Remember that low-fat does not mean "healthy."  Drink water, coffee, or tea. Skip  sugary drinks, limit milk and dairy products to one to two servings per day, and limit juice to a small glass per day.  Stay active. The red figure running across the Healthy Eating Plate's placemat is a reminder that staying active is also important in weight control.  The main message of the Healthy Eating Plate is to focus on diet quality:  The type of carbohydrate in the diet is more important than the amount of carbohydrate in the diet, because some sources of carbohydrate--like vegetables  (other than potatoes), fruits, whole grains, and beans--are healthier than others. The Healthy Eating Plate also advises consumers to avoid sugary beverages, a major source of calories--usually with little nutritional value--in the American diet. The Healthy Eating Plate encourages consumers to use healthy oils, and it does not set a maximum on the percentage of calories people should get each day from healthy sources of fat. In this way, the Healthy Eating Plate recommends the opposite of the low-fat message promoted for decades by the USDA.  [employer]  SUGAR  Sugar is a huge problem in the modern day diet. Sugar is a big contributor to heart disease, diabetes, high triglyceride levels, fatty liver disease and obesity. Sugar is hidden in almost all packaged foods/beverages. Added sugar is extra sugar that is added beyond what is naturally found and has no nutritional benefit for your body. The American Heart Association recommends limiting added sugars to no more than 25g for women and 36 grams for men per day. There are many names for sugar including maltose, sucrose (names ending in "ose"), high fructose corn syrup, molasses, cane sugar, corn sweetener, raw sugar, syrup, honey or fruit juice concentrate.   One of the best ways to limit your added sugars is to stop drinking sweetened beverages such as soda, sweet tea, and fruit juice.  There is 65g of added sugars in one 20oz bottle of Coke! That is equal to 7.5 donuts.   Pay attention and read all nutrition facts labels. Below is an examples of a nutrition facts label. The #1 is showing you the total sugars where the # 2 is showing you the added sugars. This one serving has almost the max amount of added sugars per day!   EXERCISE  Exercise is good. We've all heard that. In an ideal world, we would all have time and resources to get plenty of it. When you are active, your heart pumps more  efficiently and you will feel better.  Multiple studies show that even walking regularly has benefits that include living a longer life. The American Heart Association recommends 150 minutes per week of exercise (30 minutes per day most days of the week). You can do this in any increment you wish. Nine or more 10-minute walks count. So does an hour-long exercise class. Break the time apart into what will work in your life. Some of the best things you can do include walking briskly, jogging, cycling or swimming laps. Not everyone is ready to "exercise." Sometimes we need to start with just getting active. Here are some easy ways to be more active throughout the day:  Take the stairs instead of the elevator  Go for a 10-15 minute walk during your lunch break (find a friend to make it more enjoyable)  When shopping, park at the back of the parking lot  If you take public transportation, get off one stop early and walk the extra distance  Pace around while making phone calls  Check with  your doctor if you aren't sure what your limitations may be. Always remember to drink plenty of water when doing any type of exercise. Don't feel like a failure if you're not getting the 90-150 minutes per week. If you started by being a couch potato, then just a 10-minute walk each day is a huge improvement. Start with little victories and work your way up.   HEALTHY EATING TIPS              Plan ahead: make a menu of the meals for a week then create a grocery list to go with that menu. Consider meals that easily stretch into a night of leftovers, such as stews or casseroles. Or consider making two of your favorite meal and put one in the freezer for another night. Try a night or two each week that is "meatless" or "no cook" such as salads. When you get home from the grocery store wash and prepare your vegetables and fruits. Then when you need them they are ready to go.   Tips for going to the grocery store:  Buy store or  generic brands  Check the weekly ad from your store on-line or in their in-store flyer  Look at the unit price on the shelf tag to compare/contrast the costs of different items  Buy fruits/vegetables in season  Carrots, bananas and apples are low-cost, naturally healthy items  If meats or frozen vegetables are on sale, buy some extras and put in your freezer  Limit buying prepared or "ready to eat" items, even if they are pre-made salads or fruit snacks  Do not shop when you're hungry  Foods at eye level tend to be more expensive. Look on the high and low shelves for deals.  Consider shopping at the farmer's market for fresh foods in season.  Avoid the cookie and chip aisles (these are expensive, high in calories and low in nutritional value). Shop on the outside of the grocery store.  Healthy food preparations:  If you can't get lean hamburger, be sure to drain the fat when cooking  Steam, saut (in olive oil), grill or bake foods  Experiment with different seasonings to avoid adding salt to your foods. Kosher salt, sea salt and Himalayan salt are all still salt and should be avoided. Try seasoning food with onion, garlic, thyme, rosemary, basil ect. Onion powder or garlic powder is ok. Avoid if it says salt (ie garlic salt).

## 2023-03-23 NOTE — Telephone Encounter (Signed)
-----   Message from Olene Floss sent at 03/23/2023 12:33 PM EST ----- Please do PA for ozmepic. DM2 thanks

## 2023-03-26 MED ORDER — SEMAGLUTIDE(0.25 OR 0.5MG/DOS) 2 MG/3ML ~~LOC~~ SOPN: PEN_INJECTOR | SUBCUTANEOUS | 1 refills | Status: DC

## 2023-03-26 NOTE — Addendum Note (Signed)
Addended by: Malena Peer D on: 03/26/2023 04:01 PM   Modules accepted: Orders

## 2023-03-26 NOTE — Telephone Encounter (Signed)
Patient made aware of approval and rx sent in. F/u in 4 weeks via telephone.

## 2023-04-02 DIAGNOSIS — I10 Essential (primary) hypertension: Secondary | ICD-10-CM | POA: Diagnosis not present

## 2023-04-02 DIAGNOSIS — E785 Hyperlipidemia, unspecified: Secondary | ICD-10-CM | POA: Diagnosis not present

## 2023-04-02 DIAGNOSIS — E1169 Type 2 diabetes mellitus with other specified complication: Secondary | ICD-10-CM | POA: Diagnosis not present

## 2023-04-03 ENCOUNTER — Encounter: Payer: Self-pay | Admitting: Internal Medicine

## 2023-04-03 ENCOUNTER — Encounter: Payer: Self-pay | Admitting: Pharmacist

## 2023-04-03 LAB — HEPATIC FUNCTION PANEL
ALT: 22 [IU]/L (ref 0–32)
AST: 26 [IU]/L (ref 0–40)
Albumin: 4.3 g/dL (ref 3.8–4.9)
Alkaline Phosphatase: 131 [IU]/L — ABNORMAL HIGH (ref 44–121)
Bilirubin Total: 0.4 mg/dL (ref 0.0–1.2)
Bilirubin, Direct: 0.16 mg/dL (ref 0.00–0.40)
Total Protein: 6.9 g/dL (ref 6.0–8.5)

## 2023-04-03 LAB — BASIC METABOLIC PANEL
BUN/Creatinine Ratio: 12 (ref 9–23)
BUN: 10 mg/dL (ref 6–24)
CO2: 25 mmol/L (ref 20–29)
Calcium: 9.4 mg/dL (ref 8.7–10.2)
Chloride: 100 mmol/L (ref 96–106)
Creatinine, Ser: 0.85 mg/dL (ref 0.57–1.00)
Glucose: 104 mg/dL — ABNORMAL HIGH (ref 70–99)
Potassium: 4 mmol/L (ref 3.5–5.2)
Sodium: 142 mmol/L (ref 134–144)
eGFR: 79 mL/min/{1.73_m2} (ref >59)

## 2023-04-03 LAB — LIPID PANEL
Chol/HDL Ratio: 2.1 {ratio} (ref 0.0–4.4)
Cholesterol, Total: 116 mg/dL (ref 100–199)
HDL: 56 mg/dL (ref >39)
LDL Chol Calc (NIH): 40 mg/dL (ref 0–99)
Triglycerides: 114 mg/dL (ref 0–149)
VLDL Cholesterol Cal: 20 mg/dL (ref 5–40)

## 2023-04-04 DIAGNOSIS — E7800 Pure hypercholesterolemia, unspecified: Secondary | ICD-10-CM | POA: Diagnosis not present

## 2023-04-04 DIAGNOSIS — R748 Abnormal levels of other serum enzymes: Secondary | ICD-10-CM | POA: Diagnosis not present

## 2023-04-04 DIAGNOSIS — I4891 Unspecified atrial fibrillation: Secondary | ICD-10-CM | POA: Diagnosis not present

## 2023-04-04 DIAGNOSIS — E1169 Type 2 diabetes mellitus with other specified complication: Secondary | ICD-10-CM | POA: Diagnosis not present

## 2023-04-10 ENCOUNTER — Other Ambulatory Visit: Payer: Self-pay | Admitting: Family Medicine

## 2023-04-10 DIAGNOSIS — K769 Liver disease, unspecified: Secondary | ICD-10-CM

## 2023-04-16 DIAGNOSIS — G4733 Obstructive sleep apnea (adult) (pediatric): Secondary | ICD-10-CM | POA: Diagnosis not present

## 2023-04-20 DIAGNOSIS — G4733 Obstructive sleep apnea (adult) (pediatric): Secondary | ICD-10-CM | POA: Diagnosis not present

## 2023-04-25 ENCOUNTER — Encounter: Payer: Self-pay | Admitting: Pharmacist

## 2023-05-10 DIAGNOSIS — R5383 Other fatigue: Secondary | ICD-10-CM | POA: Diagnosis not present

## 2023-05-10 DIAGNOSIS — R748 Abnormal levels of other serum enzymes: Secondary | ICD-10-CM | POA: Diagnosis not present

## 2023-05-10 DIAGNOSIS — E1169 Type 2 diabetes mellitus with other specified complication: Secondary | ICD-10-CM | POA: Diagnosis not present

## 2023-05-10 DIAGNOSIS — R059 Cough, unspecified: Secondary | ICD-10-CM | POA: Diagnosis not present

## 2023-05-10 DIAGNOSIS — I4891 Unspecified atrial fibrillation: Secondary | ICD-10-CM | POA: Diagnosis not present

## 2023-05-19 DIAGNOSIS — G4733 Obstructive sleep apnea (adult) (pediatric): Secondary | ICD-10-CM | POA: Diagnosis not present

## 2023-05-21 ENCOUNTER — Ambulatory Visit: Payer: BC Managed Care – PPO | Admitting: Oncology

## 2023-05-21 ENCOUNTER — Other Ambulatory Visit: Payer: BC Managed Care – PPO

## 2023-05-26 ENCOUNTER — Ambulatory Visit
Admission: RE | Admit: 2023-05-26 | Discharge: 2023-05-26 | Disposition: A | Discharge status: Home / Self Care | Payer: BC Managed Care – PPO | Source: Ambulatory Visit | Attending: Family Medicine | Admitting: Family Medicine

## 2023-05-26 DIAGNOSIS — K769 Liver disease, unspecified: Secondary | ICD-10-CM

## 2023-05-26 DIAGNOSIS — K7689 Other specified diseases of liver: Secondary | ICD-10-CM | POA: Diagnosis not present

## 2023-05-26 MED ORDER — GADOPICLENOL 0.5 MMOL/ML IV SOLN
10.0000 mL | Freq: Once | INTRAVENOUS | Status: AC | PRN
  Administered 2023-05-26: 10 mL via INTRAVENOUS

## 2023-06-01 MED ORDER — OZEMPIC (1 MG/DOSE) 4 MG/3ML ~~LOC~~ SOPN: 1.0000 mg | PEN_INJECTOR | SUBCUTANEOUS | 1 refills | Status: DC

## 2023-06-03 ENCOUNTER — Other Ambulatory Visit: Payer: Self-pay | Admitting: Internal Medicine

## 2023-06-05 ENCOUNTER — Other Ambulatory Visit: Payer: Self-pay | Admitting: Internal Medicine

## 2023-06-05 DIAGNOSIS — E119 Type 2 diabetes mellitus without complications: Secondary | ICD-10-CM

## 2023-07-19 DIAGNOSIS — G4733 Obstructive sleep apnea (adult) (pediatric): Secondary | ICD-10-CM | POA: Diagnosis not present

## 2023-07-29 ENCOUNTER — Other Ambulatory Visit: Payer: Self-pay | Admitting: Internal Medicine

## 2023-08-11 NOTE — Progress Notes (Unsigned)
 Cardiology Office Note:   Date:  08/16/2023  ID:  MAZELLA Janet [name], DOB 09/04/1965, MRN [id number] PCP:  Kip Righter, MD  [facility] Providers Cardiologist:  Wendel Haws, MD Referring MD: Kip Righter, MD  Chief Complaint/Reason for Referral: Atrial fibrillation follow-up ASSESSMENT:    1. Paroxysmal atrial fibrillation (HCC)   2. Secondary hypercoagulable state (HCC)   3. Type 2 diabetes mellitus without complication, without long-term current use of insulin  (HCC)   4. Hypertension associated with diabetes (HCC)   5. Hyperlipidemia associated with type 2 diabetes mellitus (HCC)   6. BMI 40.0-44.9, adult (HCC)   7. CKD stage 3 due to type 2 diabetes mellitus (HCC)      PLAN:   In order of problems listed above: Paroxysmal atrial fibrillation: Continue Eliquis  5mg  BID, Toprol  50mg , PRN diltiazem . Secondary hypercoagulable state: Continue Eliquis  5mg  BID. Type 2 diabetes mellitus: Continue Eliquis  5 mg twice daily, Jardiance  10 mg, rosuvastatin  40 mg daily, telmisartan  20 mg daily. Hypertension: Continue telmisartan  20 mg.  Given low blood pressures and lightheadedness we will stop hydrochlorothiazide  and metoprolol . Hyperlipidemia: LDL at goal earlier this year.  Continue Zetia  10 mg and rosuvastatin  40 mg. Elevated BMI: Continue Ozempic  1 mg q. weekly CKD stage IIIa: Continue telmisartan  20 mg daily, Jardiance  10 mg daily.            Dispo:  Return in about 6 months (around 02/15/2024).      Medication Adjustments/Labs and Tests Ordered: Current medicines are reviewed at length with the patient today.  Concerns regarding medicines are outlined above.  The following changes have been made:     Labs/tests ordered: Orders Placed This Encounter  Procedures   EKG 12-Lead    Medication Changes: No orders of the defined types were placed in this encounter.   Current medicines are reviewed at length with the patient today.  The patient does not have concerns  regarding medicines.  I spent 33 minutes reviewing all clinical data during and prior to this visit including all relevant imaging studies, laboratories, clinical information from other health systems and prior notes from both Cardiology and other specialties, interviewing the patient, conducting a complete physical examination, and coordinating care in order to formulate a comprehensive and personalized evaluation and treatment plan.   History of Present Illness:      FOCUSED PROBLEM LIST:   PAF CV 2 score of 3 On Eliquis  Type 2 diabetes mellitus On metformin  Hypertension Hyperlipidemia LP(a) 230 BMI 42 CKD stage IIIa Pancreatitis Believed due to lisinopril  OSA On CPAP  1/24:  The patient is a 58 y.o. female with the indicated medical history here to establish general cardiovascular care.  The patient had been previously followed by the atrial fibrillation clinic but she desired to change due to cost.  She has been doing fairly well.  She denies any chest pain, palpitations, paroxysmal nocturnal dyspnea, bleeding, orthopnea, or significant peripheral edema.  She does not smoke.  She is switched from a high stress job which exacerbated atrial fibrillation back in April to a lower stress job.   On review of her medications she is not on lisinopril  or an ARB.  When asked about this the patient tells me that she developed pancreatitis which was thought to be attributed to lisinopril  even though she had been taking this for several years.  Because of this she was eventually started on Norvasc  and hydrochlorothiazide  to help control her blood pressure.  She has been on metoprolol  due to a  history of bigeminy.  Also offered Ozempic  but due to potential risk of pancreatitis she elected to defer this.  Plan: Increase Crestor  to 20 mg and check lipid panel LFTs, LP(a) in 2 months; refer to pharmacy to discuss SGLT2 inhibitor, ARB, and GLP-1 receptor agonist.  12/24: The patient saw pharmacy.  At  that point in time due to no evidence of CKD a change to an ARB was not pursued especially in light of low urinary protein.  In the interim she did have blood work drawn which demonstrated the development of CKD stage IIIa GFR of 57.  The patient has been doing well from a palpitation standpoint.  She feels that her palpitations are much better controlled.  She denies any cardiovascular symptoms such as chest pain or shortness of breath.  She has had no recurrent signs or symptoms of pancreatitis.  She is tolerating her medications well including her rosuvastatin .  She did have surgery to remove a lump in her breast recently which was uncomplicated.  Additionally she started CPAP for obstructive sleep apnea.  She has spoken to her her PCP about starting Ozempic  but that provider was concerned about her previous history of pancreatitis.  She is quite interested in pursuing this medication if clinically indicated.  Plan:  Restart Micardis  20, stop amlodipine , start Jardiance , check lipids/LFTs, refer to PharmD for [facility].  June 2025: Patient consents to use of AI scribe. In the interim the patient was started on Ozempic  by pharmacy.  Her LDL in December was not at goal and Zetia  10 mg was added to her regimen.  Her LDL in February was at goal with a level of 40.  She has experienced significant weight loss of approximately 22 pounds since starting Ozempic  at the end of January. This weight loss has been associated with episodes of dizziness and lightheadedness, particularly in the past week. Her blood pressure readings have been low, necessitating adjustments to her blood pressure medications.  In March, she contracted COVID-19, which severely affected her health. During this time, she was instructed to stop all medications except metoprolol  and hydrochlorothiazide . She resumed these medications after recovering, but recent symptoms of dizziness have prompted further medication adjustments.  No chest pain  is reported, and her breathing is described as 'pretty good.' However, she experiences regular pain in her arm, which she suspects may be related to her sleeping position. She has not used any anti-inflammatory medications for this pain.  She is currently on Jardiance  and has not experienced any issues with her blood thinner, Eliquis , such as bleeding. She also reports hair loss, which she noticed after starting her current medication regimen at the end of January. She is concerned about whether this could be a side effect of her medications.  Her blood sugar levels have been dipping low, and she plans to discuss this with her other doctor in August. She continues to take metformin  as part of her diabetes management.         Current Medications: Current Meds  Medication Sig   apixaban  (ELIQUIS ) 5 MG TABS tablet Take 1 tablet (5 mg total) by mouth 2 (two) times daily.   cholecalciferol (VITAMIN D3) 25 MCG (1000 UNIT) tablet Take 2,000 Units by mouth daily.   empagliflozin  (JARDIANCE ) 10 MG TABS tablet Take 1 tablet (10 mg total) by mouth daily before breakfast.   ezetimibe  (ZETIA ) 10 MG tablet Take 1 tablet (10 mg total) by mouth daily.   metFORMIN  (GLUCOPHAGE -XR) 500 MG 24 hr tablet  Take 1 tablet (500 mg total) by mouth at bedtime. (Patient taking differently: Take 500 mg by mouth 2 (two) times daily.)   rosuvastatin  (CRESTOR ) 40 MG tablet TAKE 1 TABLET BY MOUTH EVERY DAY   Semaglutide , 1 MG/DOSE, (OZEMPIC , 1 MG/DOSE,) 4 MG/3ML SOPN INJECT 1MG  INTO THE SKIN ONCE A WEEK   telmisartan  (MICARDIS ) 20 MG tablet Take 1 tablet (20 mg total) by mouth daily.   [DISCONTINUED] hydrochlorothiazide  (MICROZIDE ) 12.5 MG capsule Take 1 capsule (12.5 mg total) by mouth in the morning.   [DISCONTINUED] metoprolol  succinate (TOPROL -XL) 50 MG 24 hr tablet Take 1 tablet by mouth once a day     Review of Systems:   Please see the history of present illness.    All other systems reviewed and are negative.      EKGs/Labs/Other Test Reviewed:   EKG: EKG from January 2024 demonstrates sinus rhythm  EKG Interpretation Date/Time:  Thursday August 16 2023 14:31:47 EDT Ventricular Rate:  66 PR Interval:  214 QRS Duration:  84 QT Interval:  422 QTC Calculation: 442 R Axis:   10  Text Interpretation: Sinus rhythm with 1st degree A-V block When compared with ECG of 05-Jul-2021 08:41, Nonspecific T wave abnormality has replaced inverted T waves in Inferior leads Confirmed by Wendel Haws ([id number]) on 08/16/2023 2:40:05 PM         Risk Assessment/Calculations:    CHA2DS2-VASc Score = 3   This indicates a 3.2% annual risk of stroke. The patient's score is based upon: CHF History: 0 HTN History: 1 Diabetes History: 1 Stroke History: 0 Vascular Disease History: 0 Age Score: 0 Gender Score: 1          Physical Exam:   VS:  BP 94/70   Pulse 66   Ht 5' 4 (1.626 m)   Wt 219 lb 3.2 oz (99.4 kg)   LMP 07/22/2015   SpO2 98%   BMI 37.63 kg/m        Wt Readings from Last 3 Encounters:  08/16/23 219 lb 3.2 oz (99.4 kg)  03/23/23 234 lb (106.1 kg)  02/19/23 237 lb 4.8 oz (107.6 kg)      GENERAL:  No apparent distress, AOx3 HEENT:  No carotid bruits, +2 carotid impulses, no scleral icterus CAR: RRR no murmurs, gallops, rubs, or thrills RES:  Clear to auscultation bilaterally ABD:  Soft, nontender, nondistended, positive bowel sounds x 4 VASC:  +2 radial pulses, +2 carotid pulses NEURO:  CN 2-12 grossly intact; motor and sensory grossly intact PSYCH:  No active depression or anxiety EXT:  No edema, ecchymosis, or cyanosis  Signed, Aquiles Ruffini K Laurita Peron, MD  08/16/2023 3:14 PM    [facility] [address] [place], [place], [place]  [address] Phone: [phone]; Fax: [phone]   Note:  This document was prepared using Dragon voice recognition software and may include unintentional dictation errors.

## 2023-08-16 ENCOUNTER — Ambulatory Visit: Payer: BC Managed Care – PPO | Attending: Internal Medicine | Admitting: Internal Medicine

## 2023-08-16 ENCOUNTER — Encounter: Payer: Self-pay | Admitting: Internal Medicine

## 2023-08-16 VITALS — BP 94/70 | HR 66 | Ht 64.0 in | Wt 219.2 lb

## 2023-08-16 DIAGNOSIS — E1122 Type 2 diabetes mellitus with diabetic chronic kidney disease: Secondary | ICD-10-CM

## 2023-08-16 DIAGNOSIS — D6869 Other thrombophilia: Secondary | ICD-10-CM

## 2023-08-16 DIAGNOSIS — Z6841 Body Mass Index (BMI) 40.0 and over, adult: Secondary | ICD-10-CM

## 2023-08-16 DIAGNOSIS — E119 Type 2 diabetes mellitus without complications: Secondary | ICD-10-CM | POA: Diagnosis not present

## 2023-08-16 DIAGNOSIS — I152 Hypertension secondary to endocrine disorders: Secondary | ICD-10-CM

## 2023-08-16 DIAGNOSIS — E1169 Type 2 diabetes mellitus with other specified complication: Secondary | ICD-10-CM

## 2023-08-16 DIAGNOSIS — I480 Paroxysmal atrial fibrillation: Secondary | ICD-10-CM

## 2023-08-16 DIAGNOSIS — E1159 Type 2 diabetes mellitus with other circulatory complications: Secondary | ICD-10-CM | POA: Diagnosis not present

## 2023-08-16 DIAGNOSIS — N183 Chronic kidney disease, stage 3 unspecified: Secondary | ICD-10-CM

## 2023-08-16 DIAGNOSIS — E785 Hyperlipidemia, unspecified: Secondary | ICD-10-CM

## 2023-08-16 MED ORDER — ROSUVASTATIN CALCIUM 40 MG PO TABS: 40.0000 mg | ORAL_TABLET | Freq: Every day | ORAL | 3 refills | Status: AC

## 2023-08-16 NOTE — Addendum Note (Signed)
 Addended by: VICCI ROXIE CROME on: 08/16/2023 03:24 PM   Modules accepted: Orders

## 2023-08-16 NOTE — Patient Instructions (Signed)
 Medication Instructions:  Your physician has recommended you make the following change in your medication:   1) STOP metoprolol  succinate (Toprol  XL) 2) STOP hydrochlorothiazide   *If you need a refill on your cardiac medications before your next appointment, please call your pharmacy*  Follow-Up: At [facility], you and your health needs are our priority.  As part of our continuing mission to provide you with exceptional heart care, our providers are all part of one team.  This team includes your primary Cardiologist (physician) and Advanced Practice Providers or APPs (Physician Assistants and Nurse Practitioners) who all work together to provide you with the care you need, when you need it.  Your next appointment:   6 month(s)  Provider:   Jackee Alberts, NP or Glendia Ferrier, PA-C   We recommend signing up for the patient portal called MyChart.  Sign up information is provided on this After Visit Summary.  MyChart is used to connect with patients for Virtual Visits (Telemedicine).  Patients are able to view lab/test results, encounter notes, upcoming appointments, etc.  Non-urgent messages can be sent to your provider as well.   To learn more about what you can do with MyChart, go to [email or web address].

## 2023-08-19 DIAGNOSIS — G4733 Obstructive sleep apnea (adult) (pediatric): Secondary | ICD-10-CM | POA: Diagnosis not present

## 2023-08-20 ENCOUNTER — Telehealth: Payer: Self-pay | Admitting: Pharmacist

## 2023-08-20 NOTE — Telephone Encounter (Signed)
 Patient called to report that she accidentally lost one dose of Ozempic . She mentioned that if her insurance does not allow an early refill, she plans to skip one week's dose. And resume her routine on 1 mg once week Additionally, she has recently been experiencing significant hair loss.  Advised the patient to focus on consuming micronutrient-dense foods such as leafy greens (spinach, kale), certain fruits (berries, citrus), fatty fish (salmon, sardines), liver, eggs, and vegetables like sweet potatoes and broccoli. Also recommended considering a multivitamin supplement to support overall nutrient intake.

## 2023-09-07 ENCOUNTER — Other Ambulatory Visit: Payer: BC Managed Care – PPO

## 2023-09-21 DIAGNOSIS — Z0000 Encounter for general adult medical examination without abnormal findings: Secondary | ICD-10-CM | POA: Diagnosis not present

## 2023-09-21 DIAGNOSIS — I10 Essential (primary) hypertension: Secondary | ICD-10-CM | POA: Diagnosis not present

## 2023-09-21 DIAGNOSIS — E559 Vitamin D deficiency, unspecified: Secondary | ICD-10-CM | POA: Diagnosis not present

## 2023-09-21 DIAGNOSIS — I4891 Unspecified atrial fibrillation: Secondary | ICD-10-CM | POA: Diagnosis not present

## 2023-09-21 DIAGNOSIS — E1169 Type 2 diabetes mellitus with other specified complication: Secondary | ICD-10-CM | POA: Diagnosis not present

## 2023-10-11 ENCOUNTER — Telehealth: Payer: Self-pay | Admitting: Internal Medicine

## 2023-10-11 ENCOUNTER — Other Ambulatory Visit: Payer: Self-pay

## 2023-10-11 DIAGNOSIS — I480 Paroxysmal atrial fibrillation: Secondary | ICD-10-CM

## 2023-10-11 MED ORDER — APIXABAN 5 MG PO TABS: 5.0000 mg | ORAL_TABLET | Freq: Two times a day (BID) | ORAL | 1 refills | Status: AC

## 2023-10-11 NOTE — Telephone Encounter (Signed)
 Prescription refill request for Eliquis  received. Indication: Afib  Last office visit: 08/16/23 Jama)  Scr: 0.85 (04/02/23)  Age: 58  Weight: 99.4kg  Appropriate dose. Refill sent.

## 2023-10-11 NOTE — Telephone Encounter (Signed)
*  STAT* If patient is at the pharmacy, call can be transferred to refill team.   1. Which medications need to be refilled? (please list name of each medication and dose if known)   apixaban  (ELIQUIS ) 5 MG TABS tablet   2. Would you like to learn more about the convenience, safety, & potential cost savings by using the [facility]?   3. Are you open to using the [facility] (Type [facility]. ).  4. Which pharmacy/location (including street and city if local pharmacy) is medication to be sent to?  [id number] - [place], [place] - [address]   5. Do they need a 30 day or 90 day supply?   90 day    Patient stated she still has some medication.

## 2023-10-11 NOTE — Telephone Encounter (Signed)
 Refill sent in another encounter.

## 2023-10-19 DIAGNOSIS — G4733 Obstructive sleep apnea (adult) (pediatric): Secondary | ICD-10-CM | POA: Diagnosis not present

## 2023-11-09 DIAGNOSIS — Z7901 Long term (current) use of anticoagulants: Secondary | ICD-10-CM | POA: Diagnosis not present

## 2023-11-09 DIAGNOSIS — Z860100 Personal history of colon polyps, unspecified: Secondary | ICD-10-CM | POA: Diagnosis not present

## 2023-11-09 DIAGNOSIS — I4891 Unspecified atrial fibrillation: Secondary | ICD-10-CM | POA: Diagnosis not present

## 2023-11-12 ENCOUNTER — Telehealth: Payer: Self-pay

## 2023-11-12 NOTE — Telephone Encounter (Signed)
   Pre-operative Risk Assessment    Patient Name: Janet [name]  DOB: 10/30/1965 MRN: [id number]   Date of last office visit: 08/16/23 LURENA RED, MD Date of next office visit: NONE   Request for Surgical Clearance    Procedure:  COLONOSCOPY  Date of Surgery:  Clearance 12/11/23                                Surgeon:  DR ESTELITA MANAS Surgeon's Group or Practice Name:  [facility] Phone number:  [phone] Fax number:  [phone]   Type of Clearance Requested:   - Medical  - Pharmacy:  Hold Apixaban  (Eliquis ) 2 DAYS PRIOR   Type of Anesthesia:  PROPOFOL    Additional requests/questions:    Signed, Lucie DELENA Ku   11/12/2023, 11:13 AM

## 2023-11-12 NOTE — Telephone Encounter (Signed)
 Pharmacy, can you please give recommendations for holding Eliquis ?  Thank you!

## 2023-11-16 NOTE — Telephone Encounter (Addendum)
 Patient with diagnosis of Afib on Eliquis  for anticoagulation.    Procedure:  COLONOSCOPY   Date of Surgery:  Clearance 12/11/23        CHA2DS2-VASc Score = 3   This indicates a 3.2% annual risk of stroke. The patient's score is based upon: CHF History: 0 HTN History: 1 Diabetes History: 1 Stroke History: 0 Vascular Disease History: 0 Age Score: 0 Gender Score: 1    CrCl 81 ml/min Platelet count 374 K  Patient has not had an Afib/aflutter ablation within the last 3 months or DCCV within the last 30 days  Per office protocol, patient can hold Eliquis  for 2 days prior to procedure.   Patient will not need bridging with Lovenox  (enoxaparin ) around procedure.  **This guidance is not considered finalized until pre-operative APP has relayed final recommendations.**

## 2023-11-16 NOTE — Telephone Encounter (Signed)
 S/W pt and scheduled IN OFFICE Preop appt (at request of pt) for 11/27/23 with Damien Braver, NP  Will update the surgeons office

## 2023-11-16 NOTE — Telephone Encounter (Signed)
   Name: Janet [name]  DOB: 25-Jul-1965  MRN: [id number]  Primary Cardiologist: Arun K Thukkani, MD   Preoperative team, please contact this patient and set up a phone call appointment for further preoperative risk assessment. Please obtain consent and complete medication review. Thank you for your help.  I confirm that guidance regarding antiplatelet and oral anticoagulation therapy has been completed and, if necessary, noted below.  Per office protocol, patient can hold Eliquis  for 2 days prior to procedure.   Patient will not need bridging with Lovenox  (enoxaparin ) around procedure.  I also confirmed the patient resides in the state of [place] . As per [facility] [employer] telemedicine laws, the patient must reside in the state in which the provider is licensed.   Lum LITTIE Louis, NP 11/16/2023, 4:33 PM [facility]

## 2023-11-20 ENCOUNTER — Ambulatory Visit (HOSPITAL_BASED_OUTPATIENT_CLINIC_OR_DEPARTMENT_OTHER)
Admission: RE | Admit: 2023-11-20 | Discharge: 2023-11-20 | Disposition: A | Discharge status: Home / Self Care | Source: Ambulatory Visit | Attending: Obstetrics and Gynecology | Admitting: Obstetrics and Gynecology

## 2023-11-20 ENCOUNTER — Ambulatory Visit: Payer: Self-pay | Admitting: Obstetrics and Gynecology

## 2023-11-20 DIAGNOSIS — Z780 Asymptomatic menopausal state: Secondary | ICD-10-CM | POA: Diagnosis not present

## 2023-11-20 DIAGNOSIS — E2839 Other primary ovarian failure: Secondary | ICD-10-CM | POA: Insufficient documentation
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
## 2023-11-27 ENCOUNTER — Ambulatory Visit: Attending: Nurse Practitioner | Admitting: Cardiology

## 2023-11-27 ENCOUNTER — Encounter: Payer: Self-pay | Admitting: Cardiology

## 2023-11-27 VITALS — BP 132/88 | HR 77 | Ht 64.0 in | Wt 223.2 lb

## 2023-11-27 DIAGNOSIS — E119 Type 2 diabetes mellitus without complications: Secondary | ICD-10-CM

## 2023-11-27 DIAGNOSIS — Z136 Encounter for screening for cardiovascular disorders: Secondary | ICD-10-CM

## 2023-11-27 DIAGNOSIS — E785 Hyperlipidemia, unspecified: Secondary | ICD-10-CM

## 2023-11-27 DIAGNOSIS — Z01810 Encounter for preprocedural cardiovascular examination: Secondary | ICD-10-CM

## 2023-11-27 DIAGNOSIS — E1169 Type 2 diabetes mellitus with other specified complication: Secondary | ICD-10-CM

## 2023-11-27 DIAGNOSIS — E1159 Type 2 diabetes mellitus with other circulatory complications: Secondary | ICD-10-CM

## 2023-11-27 DIAGNOSIS — D6869 Other thrombophilia: Secondary | ICD-10-CM

## 2023-11-27 DIAGNOSIS — I480 Paroxysmal atrial fibrillation: Secondary | ICD-10-CM | POA: Diagnosis not present

## 2023-11-27 DIAGNOSIS — N183 Chronic kidney disease, stage 3 unspecified: Secondary | ICD-10-CM

## 2023-11-27 DIAGNOSIS — I152 Hypertension secondary to endocrine disorders: Secondary | ICD-10-CM

## 2023-11-27 DIAGNOSIS — E1122 Type 2 diabetes mellitus with diabetic chronic kidney disease: Secondary | ICD-10-CM

## 2023-11-27 NOTE — Patient Instructions (Signed)
 Medication Instructions:  Your physician recommends that you continue on your current medications as directed. Please refer to the Current Medication list given to you today.  *If you need a refill on your cardiac medications before your next appointment, please call your pharmacy*  Lab Work: NONE ordered at this time of appointment   Testing/Procedures: NONE ordered at this time of appointment   Follow-Up: At [facility], you and your health needs are our priority.  As part of our continuing mission to provide you with exceptional heart care, our providers are all part of one team.  This team includes your primary Cardiologist (physician) and Advanced Practice Providers or APPs (Physician Assistants and Nurse Practitioners) who all work together to provide you with the care you need, when you need it.  Your next appointment:    Keep follow up   Provider:   Arun K Thukkani, MD    We recommend signing up for the patient portal called MyChart.  Sign up information is provided on this After Visit Summary.  MyChart is used to connect with patients for Virtual Visits (Telemedicine).  Patients are able to view lab/test results, encounter notes, upcoming appointments, etc.  Non-urgent messages can be sent to your provider as well.   To learn more about what you can do with MyChart, go to [email or web address].

## 2023-12-11 DIAGNOSIS — Z860101 Personal history of adenomatous and serrated colon polyps: Secondary | ICD-10-CM | POA: Diagnosis not present

## 2023-12-11 DIAGNOSIS — D122 Benign neoplasm of ascending colon: Secondary | ICD-10-CM | POA: Diagnosis not present

## 2023-12-11 DIAGNOSIS — K648 Other hemorrhoids: Secondary | ICD-10-CM | POA: Diagnosis not present

## 2023-12-11 DIAGNOSIS — K644 Residual hemorrhoidal skin tags: Secondary | ICD-10-CM | POA: Diagnosis not present

## 2023-12-11 DIAGNOSIS — Z09 Encounter for follow-up examination after completed treatment for conditions other than malignant neoplasm: Secondary | ICD-10-CM | POA: Diagnosis not present

## 2023-12-11 DIAGNOSIS — K6289 Other specified diseases of anus and rectum: Secondary | ICD-10-CM | POA: Diagnosis not present

## 2023-12-31 DIAGNOSIS — E785 Hyperlipidemia, unspecified: Secondary | ICD-10-CM | POA: Diagnosis not present

## 2023-12-31 DIAGNOSIS — I4891 Unspecified atrial fibrillation: Secondary | ICD-10-CM | POA: Diagnosis not present

## 2023-12-31 DIAGNOSIS — E1169 Type 2 diabetes mellitus with other specified complication: Secondary | ICD-10-CM | POA: Diagnosis not present

## 2023-12-31 DIAGNOSIS — I10 Essential (primary) hypertension: Secondary | ICD-10-CM | POA: Diagnosis not present

## 2023-12-31 DIAGNOSIS — E559 Vitamin D deficiency, unspecified: Secondary | ICD-10-CM | POA: Diagnosis not present

## 2024-01-10 ENCOUNTER — Emergency Department (HOSPITAL_COMMUNITY)
Admission: EM | Admit: 2024-01-10 | Discharge: 2024-01-10 | Disposition: A | Discharge status: Home / Self Care | Attending: Emergency Medicine | Admitting: Emergency Medicine

## 2024-01-10 ENCOUNTER — Emergency Department (HOSPITAL_COMMUNITY)

## 2024-01-10 ENCOUNTER — Encounter (HOSPITAL_COMMUNITY): Payer: Self-pay | Admitting: Emergency Medicine

## 2024-01-10 DIAGNOSIS — I10 Essential (primary) hypertension: Secondary | ICD-10-CM | POA: Insufficient documentation

## 2024-01-10 DIAGNOSIS — I639 Cerebral infarction, unspecified: Secondary | ICD-10-CM | POA: Diagnosis not present

## 2024-01-10 DIAGNOSIS — I480 Paroxysmal atrial fibrillation: Secondary | ICD-10-CM | POA: Insufficient documentation

## 2024-01-10 DIAGNOSIS — Z7901 Long term (current) use of anticoagulants: Secondary | ICD-10-CM | POA: Diagnosis not present

## 2024-01-10 DIAGNOSIS — R519 Headache, unspecified: Secondary | ICD-10-CM | POA: Diagnosis not present

## 2024-01-10 DIAGNOSIS — G43809 Other migraine, not intractable, without status migrainosus: Secondary | ICD-10-CM | POA: Insufficient documentation

## 2024-01-10 LAB — CBC
HCT: 45.1 % (ref 36.0–46.0)
Hemoglobin: 14.6 g/dL (ref 12.0–15.0)
MCH: 28 pg (ref 26.0–34.0)
MCHC: 32.4 g/dL (ref 30.0–36.0)
MCV: 86.6 fL (ref 80.0–100.0)
Platelets: 349 K/uL (ref 150–400)
RBC: 5.21 MIL/uL — ABNORMAL HIGH (ref 3.87–5.11)
RDW: 14.6 % (ref 11.5–15.5)
WBC: 8.8 K/uL (ref 4.0–10.5)
nRBC: 0 % (ref 0.0–0.2)

## 2024-01-10 LAB — BASIC METABOLIC PANEL WITH GFR
Anion gap: 10 (ref 5–15)
BUN: 9 mg/dL (ref 6–20)
CO2: 27 mmol/L (ref 22–32)
Calcium: 9.6 mg/dL (ref 8.9–10.3)
Chloride: 102 mmol/L (ref 98–111)
Creatinine, Ser: 0.82 mg/dL (ref 0.44–1.00)
GFR, Estimated: >60 mL/min (ref >60)
Glucose, Bld: 92 mg/dL (ref 70–99)
Potassium: 4.3 mmol/L (ref 3.5–5.1)
Sodium: 139 mmol/L (ref 135–145)

## 2024-01-10 MED ORDER — METOCLOPRAMIDE HCL 10 MG PO TABS: 10.0000 mg | ORAL_TABLET | Freq: Three times a day (TID) | ORAL | 0 refills | Status: AC | PRN

## 2024-01-10 MED ORDER — DEXAMETHASONE SOD PHOSPHATE PF 10 MG/ML IJ SOLN
6.0000 mg | Freq: Once | INTRAMUSCULAR | Status: AC
  Administered 2024-01-10: 6 mg via INTRAVENOUS

## 2024-01-10 MED ORDER — METOCLOPRAMIDE HCL 5 MG/ML IJ SOLN
10.0000 mg | Freq: Once | INTRAMUSCULAR | Status: AC
  Administered 2024-01-10: 10 mg via INTRAVENOUS
  Filled 2024-01-10: qty 2

## 2024-01-10 MED ORDER — ONDANSETRON HCL 4 MG/2ML IJ SOLN
4.0000 mg | Freq: Once | INTRAMUSCULAR | Status: AC
  Administered 2024-01-10: 4 mg via INTRAVENOUS
  Filled 2024-01-10: qty 2

## 2024-01-10 MED ORDER — DIPHENHYDRAMINE HCL 50 MG/ML IJ SOLN
12.5000 mg | Freq: Once | INTRAMUSCULAR | Status: AC
  Administered 2024-01-10: 12.5 mg via INTRAVENOUS
  Filled 2024-01-10: qty 1

## 2024-01-10 MED ORDER — OXYCODONE-ACETAMINOPHEN 5-325 MG PO TABS
1.0000 | ORAL_TABLET | Freq: Once | ORAL | Status: DC
Start: 2024-01-10 — End: 2024-01-10

## 2024-01-10 NOTE — ED Triage Notes (Addendum)
 Pt arriving POV with migraine x2 days with emesis and balance issues. Pt reports hx of Bell's palsy. Pt states she was told by walk-in clinic to come to ER for CT scan.

## 2024-01-10 NOTE — ED Provider Notes (Signed)
[place] EMERGENCY DEPARTMENT AT [facility] Provider Note   CSN: [id number] Arrival date & time: 01/10/24  1328     Patient presents with: Migraine   Janet [name] is a 58 y.o. adult with history of paroxysmal A-fib, on Cardizem  and Eliquis , presenting from urgent care with continued concern for headache and balance problems.  Patient reports that this began about 2 nights ago when she woke up and felt that she was off balance.  She says she has had vertigo in the past but this felt different to her.  She was extremely nauseated this and vomited.  She also felt unwell yesterday.  She said has had a persistent headache for the past 2 days, sometimes near the right temple, sometimes near the left, she does not typically get migraines or headaches.   HPI     Prior to Admission medications   Medication Sig Start Date End Date Taking? Authorizing Provider  metoCLOPramide  (REGLAN ) 10 MG tablet Take 1 tablet (10 mg total) by mouth every 8 (eight) hours as needed for up to 12 doses for nausea. 01/10/24  Yes Demarus Latterell, Donnice PARAS, MD  apixaban  (ELIQUIS ) 5 MG TABS tablet Take 1 tablet (5 mg total) by mouth 2 (two) times daily. 10/11/23   Thukkani, Arun K, MD  cholecalciferol (VITAMIN D3) 25 MCG (1000 UNIT) tablet Take 2,000 Units by mouth daily.    [provider]  diltiazem  (CARDIZEM ) 30 MG tablet Take 1 tablet by mouth every 4 hours as needed for heart rate >100 Patient not taking: Reported on 11/27/2023 06/02/21   Dow Arland BROCKS, NP  ezetimibe  (ZETIA ) 10 MG tablet Take 1 tablet (10 mg total) by mouth daily. 02/22/23 11/27/23  Thukkani, Arun K, MD  rosuvastatin  (CRESTOR ) 40 MG tablet Take 1 tablet (40 mg total) by mouth daily. 08/16/23   Thukkani, Arun K, MD  Semaglutide , 1 MG/DOSE, (OZEMPIC , 1 MG/DOSE,) 4 MG/3ML SOPN INJECT 1MG  INTO THE SKIN ONCE A WEEK 07/30/23   Thukkani, Arun K, MD  telmisartan  (MICARDIS ) 20 MG tablet Take 1 tablet (20 mg total) by mouth daily. 02/12/23    Thukkani, Arun K, MD    Allergies: Penicillins    Review of Systems  Updated Vital Signs BP (!) 174/101 (BP Location: Left Arm)   Pulse 66   Temp 98.3 F (36.8 C) (Oral)   Resp 20   Wt 102.6 kg   LMP 07/22/2015   SpO2 99%   BMI 38.84 kg/m   Physical Exam Constitutional:      General: She is not in acute distress. HENT:     Head: Normocephalic and atraumatic.  Eyes:     Conjunctiva/sclera: Conjunctivae normal.     Pupils: Pupils are equal, round, and reactive to light.  Cardiovascular:     Rate and Rhythm: Normal rate and regular rhythm.  Pulmonary:     Effort: Pulmonary effort is normal. No respiratory distress.  Abdominal:     General: There is no distension.     Tenderness: There is no abdominal tenderness.  Skin:    General: Skin is warm and dry.  Neurological:     Mental Status: She is alert and oriented to person, place, and time. Mental status is at baseline.     Comments: No nystagmus on exam, no difficulty with finger-nose, normal Romberg testing, mildly unsteady gait  Psychiatric:        Mood and Affect: Mood normal.        Behavior: Behavior normal.     (  all labs ordered are listed, but only abnormal results are displayed) Labs Reviewed  CBC - Abnormal; Notable for the following components:      Result Value   RBC 5.21 (*)    All other components within normal limits  BASIC METABOLIC PANEL WITH GFR    EKG: EKG Interpretation Date/Time:  Thursday January 10 2024 16:00:30 EST Ventricular Rate:  71 PR Interval:  201 QRS Duration:  82 QT Interval:  422 QTC Calculation: 459 R Axis:   9  Text Interpretation: Sinus rhythm Atrial premature complex Confirmed by Cottie Cough [phone]) on 01/10/2024 4:06:54 PM  Radiology: MR BRAIN WO CONTRAST Result Date: 01/10/2024 EXAM: MRI BRAIN WITHOUT CONTRAST 01/10/2024 06:34:47 PM TECHNIQUE: Multiplanar multisequence MRI of the head/brain was performed without the administration of intravenous contrast.  COMPARISON: CT Head 02/17/17 CLINICAL HISTORY: Stroke, follow up; vertigo, cerebellar stroke evaluation FINDINGS: BRAIN AND VENTRICLES: No acute infarct. No intracranial hemorrhage. No mass. No midline shift. No hydrocephalus. The sella is unremarkable. Normal flow voids. Small arachnoid cyst in the anterior aspect of the left middle cranial fossa without substantial mass effect. Mild chronic microvascular ischemic change. ORBITS: No acute abnormality. SINUSES AND MASTOIDS: No acute abnormality. BONES AND SOFT TISSUES: Normal marrow signal. No acute soft tissue abnormality. IMPRESSION: Electronically signed by: Gilmore Molt MD 01/10/2024 07:28 PM EST RP Workstation: [id number]     Procedures   Medications Ordered in the ED  metoCLOPramide (REGLAN) injection 10 mg (10 mg Intravenous Given 01/10/24 1554)  dexamethasone  (DECADRON ) injection 6 mg (6 mg Intravenous Given 01/10/24 1554)  diphenhydrAMINE  (BENADRYL ) injection 12.5 mg (12.5 mg Intravenous Given 01/10/24 1554)  ondansetron  (ZOFRAN ) injection 4 mg (4 mg Intravenous Given 01/10/24 1554)    Clinical Course as of 01/10/24 1954  Thu Jan 10, 2024  1721 Patient is reporting that migraine headache is significantly improved.  She is in line for MRI [MT]    Clinical Course User Index [MT] Tramane Gorum, Cough PARAS, MD                                 Medical Decision Making Amount and/or Complexity of Data Reviewed Labs: ordered. Radiology: ordered. ECG/medicine tests: ordered.  Risk Prescription drug management.   Patient is here with complaint of headache, also having balance difficulty, nausea and vomiting.  Differential would include peripheral vertigo versus viral URI versus CVA versus other  Given her history of atrial fibrillation she is at high risk of stroke.  I think MR imaging would be reasonable to evaluate for cerebellar lesion.  We will also give IV migraine medications for potential complex migraine.  Patient reassessed and  pain is significantly improved.  No emergent findings on blood tests or MRI of the brain.  She is stable for discharge.  I will refer her to neurology for potential complex migraine.       Final diagnoses:  Other migraine without status migrainosus, not intractable  Hypertension, unspecified type    ED Discharge Orders          Ordered    Ambulatory referral to Neurology       Comments: An appointment is requested in approximately: 2 weeks Complex migraine   01/10/24 1953    metoCLOPramide (REGLAN) 10 MG tablet  Every 8 hours PRN        01/10/24 1953               Katalin Colledge, Cough PARAS, MD  01/10/24 1954  

## 2024-01-10 NOTE — ED Notes (Signed)
 Patient transported to MRI

## 2024-01-11 ENCOUNTER — Encounter: Payer: Self-pay | Admitting: Neurology

## 2024-01-11 ENCOUNTER — Ambulatory Visit: Admitting: Obstetrics and Gynecology

## 2024-01-21 NOTE — Progress Notes (Signed)
 Cardiology Office Note:   Date:  01/28/2024  ID:  Janet [name], DOB [birth year], MRN [id number] PCP:  Kip Righter, MD  [facility] Providers Cardiologist:  Wendel Haws, MD Referring MD: Kip Righter, MD  Chief Complaint/Reason for Referral: Atrial fibrillation follow-up ASSESSMENT:    1. Paroxysmal atrial fibrillation (HCC)   2. Hypercoagulable state due to paroxysmal atrial fibrillation (HCC)   3. Type 2 diabetes mellitus without complication, without long-term current use of insulin  (HCC)   4. Hypertension associated with diabetes (HCC)   5. Hyperlipidemia associated with type 2 diabetes mellitus (HCC)   6. BMI 40.0-44.9, adult (HCC)   7. CKD stage 3 due to type 2 diabetes mellitus (HCC)       PLAN:   In order of problems listed above: Paroxysmal atrial fibrillation: Continue Eliquis  5mg  BID, Toprol  50mg , PRN diltiazem . Secondary hypercoagulable state: Continue Eliquis  5mg  BID. Type 2 diabetes mellitus: Continue Eliquis  5 mg twice daily, restart Jardiance  10 mg, continue rosuvastatin  40 mg daily, telmisartan  20 mg daily. Hypertension: Continue telmisartan  20 mg.  BP well controlled. Hyperlipidemia: LDL was 40 in February 2025.  Continue Zetia  10 mg and rosuvastatin  40 mg. Elevated BMI: Continue Ozempic  1 mg q. weekly CKD stage IIIa: Continue telmisartan  20 mg daily, restart Jardiance  10 mg daily for renal protection            Dispo:  Return in about 1 year (around 01/27/2025).      Medication Adjustments/Labs and Tests Ordered: Current medicines are reviewed at length with the patient today.  Concerns regarding medicines are outlined above.  The following changes have been made:     Labs/tests ordered: No orders of the defined types were placed in this encounter.   Medication Changes: Meds ordered this encounter  Medications   telmisartan  (MICARDIS ) 20 MG tablet    Sig: Take 1 tablet (20 mg total) by mouth daily.    Dispense:  90 tablet    Refill:   3    Stop amlodipine    ezetimibe  (ZETIA ) 10 MG tablet    Sig: Take 1 tablet (10 mg total) by mouth daily.    Dispense:  90 tablet    Refill:  3   empagliflozin  (JARDIANCE ) 10 MG TABS tablet    Sig: Take 1 tablet (10 mg total) by mouth daily before breakfast.    Dispense:  90 tablet    Refill:  3    Current medicines are reviewed at length with the patient today.  The patient does not have concerns regarding medicines.  I spent 32 minutes reviewing all clinical data during and prior to this visit including all relevant imaging studies, laboratories, clinical information from other health systems and prior notes from both Cardiology and other specialties, interviewing the patient, conducting a complete physical examination, and coordinating care in order to formulate a comprehensive and personalized evaluation and treatment plan.   History of Present Illness:      FOCUSED PROBLEM LIST:   PAF CV 2 score of 3 On Eliquis  Type 2 diabetes mellitus Diet controlled Hypertension Hyperlipidemia LP(a) 230 BMI 42 CKD stage IIIa Pancreatitis Believed due to lisinopril  OSA On CPAP  1/24:  The patient is a 58 y.o. female with the indicated medical history here to establish general cardiovascular care.  The patient had been previously followed by the atrial fibrillation clinic but she desired to change due to cost.  She has been doing fairly well.  She denies any chest pain, palpitations, paroxysmal nocturnal dyspnea,  bleeding, orthopnea, or significant peripheral edema.  She does not smoke.  She is switched from a high stress job which exacerbated atrial fibrillation back in April to a lower stress job.   On review of her medications she is not on lisinopril  or an ARB.  When asked about this the patient tells me that she developed pancreatitis which was thought to be attributed to lisinopril  even though she had been taking this for several years.  Because of this she was eventually started on  Norvasc  and hydrochlorothiazide  to help control her blood pressure.  She has been on metoprolol  due to a history of bigeminy.  Also offered Ozempic  but due to potential risk of pancreatitis she elected to defer this.  Plan: Increase Crestor  to 20 mg and check lipid panel LFTs, LP(a) in 2 months; refer to pharmacy to discuss SGLT2 inhibitor, ARB, and GLP-1 receptor agonist.  12/24: The patient saw pharmacy.  At that point in time due to no evidence of CKD a change to an ARB was not pursued especially in light of low urinary protein.  In the interim she did have blood work drawn which demonstrated the development of CKD stage IIIa GFR of 57.  The patient has been doing well from a palpitation standpoint.  She feels that her palpitations are much better controlled.  She denies any cardiovascular symptoms such as chest pain or shortness of breath.  She has had no recurrent signs or symptoms of pancreatitis.  She is tolerating her medications well including her rosuvastatin .  She did have surgery to remove a lump in her breast recently which was uncomplicated.  Additionally she started CPAP for obstructive sleep apnea.  She has spoken to her her PCP about starting Ozempic  but that provider was concerned about her previous history of pancreatitis.  She is quite interested in pursuing this medication if clinically indicated.  Plan:  Restart Micardis  20, stop amlodipine , start Jardiance , check lipids/LFTs, refer to Pharm[facility] for [facility].  June 2025: Patient consents to use of AI scribe. In the interim the patient was started on Ozempic  by pharmacy.  Her LDL in December was not at goal and Zetia  10 mg was added to her regimen.  Her LDL in February was at goal with a level of 40.  She has experienced significant weight loss of approximately 22 pounds since starting Ozempic  at the end of January. This weight loss has been associated with episodes of dizziness and lightheadedness, particularly in the past week. Her blood  pressure readings have been low, necessitating adjustments to her blood pressure medications.  In March, she contracted COVID-19, which severely affected her health. During this time, she was instructed to stop all medications except metoprolol  and hydrochlorothiazide . She resumed these medications after recovering, but recent symptoms of dizziness have prompted further medication adjustments.  No chest pain is reported, and her breathing is described as 'pretty good.' However, she experiences regular pain in her arm, which she suspects may be related to her sleeping position. She has not used any anti-inflammatory medications for this pain.  She is currently on Jardiance  and has not experienced any issues with her blood thinner, Eliquis , such as bleeding. She also reports hair loss, which she noticed after starting her current medication regimen at the end of January. She is concerned about whether this could be a side effect of her medications.  Her blood sugar levels have been dipping low, and she plans to discuss this with her other doctor in August. She  continues to take metformin  as part of her diabetes management.  Plan: Stop hydrochlorothiazide  and metoprolol   December 2025:  Patient consents to use of AI scribe. The patient returns for routine follow-up.  She was seen in the ED recently due to migraines.  An MRI of her brain was reassuring.  Her diabetes management included stopping metformin  and Jardiance  when her A1c dropped to 6.3. However, her A1c later increased to 6.5. She is currently on Ozempic  and has lost about 25 pounds, which has improved her energy levels.  She denies any bleeding while on Eliquis . She had a cardiac precheck for a colonoscopy and was advised that she might not need to return for a year.  No bleeding on Eliquis . Occasional palpitations and dizziness, which she describes as different from vertigo. Good breathing.         Current Medications: Current Meds   Medication Sig   apixaban  (ELIQUIS ) 5 MG TABS tablet Take 1 tablet (5 mg total) by mouth 2 (two) times daily.   cholecalciferol (VITAMIN D3) 25 MCG (1000 UNIT) tablet Take 2,000 Units by mouth daily.   diltiazem  (CARDIZEM ) 30 MG tablet Take 1 tablet by mouth every 4 hours as needed for heart rate >100   empagliflozin  (JARDIANCE ) 10 MG TABS tablet Take 1 tablet (10 mg total) by mouth daily before breakfast.   metoCLOPramide  (REGLAN ) 10 MG tablet Take 1 tablet (10 mg total) by mouth every 8 (eight) hours as needed for up to 12 doses for nausea.   Multiple Vitamins-Minerals (MULTI FOR HER) TABS 1 tablet Orally once a day   rosuvastatin  (CRESTOR ) 40 MG tablet Take 1 tablet (40 mg total) by mouth daily.   Semaglutide , 1 MG/DOSE, (OZEMPIC , 1 MG/DOSE,) 4 MG/3ML SOPN INJECT 1MG  INTO THE SKIN ONCE A WEEK   [DISCONTINUED] ezetimibe  (ZETIA ) 10 MG tablet Take 1 tablet (10 mg total) by mouth daily.   [DISCONTINUED] telmisartan  (MICARDIS ) 20 MG tablet Take 1 tablet (20 mg total) by mouth daily.     Review of Systems:   Please see the history of present illness.    All other systems reviewed and are negative.     EKGs/Labs/Other Test Reviewed:   EKG: 2025 sinus rhythm with PACs  EKG Interpretation Date/Time:    Ventricular Rate:    PR Interval:    QRS Duration:    QT Interval:    QTC Calculation:   R Axis:      Text Interpretation:           Risk Assessment/Calculations:    CHA2DS2-VASc Score = 3   This indicates a 3.2% annual risk of stroke. The patient's score is based upon: CHF History: 0 HTN History: 1 Diabetes History: 1 Stroke History: 0 Vascular Disease History: 0 Age Score: 0 Gender Score: 1          Physical Exam:   VS:  BP 126/78   Pulse 79   Ht 5' 4 (1.626 m)   Wt 221 lb (100.2 kg)   LMP 07/22/2015   SpO2 97%   BMI 37.93 kg/m        Wt Readings from Last 3 Encounters:  01/28/24 221 lb (100.2 kg)  01/10/24 226 lb 4.8 oz (102.6 kg)  11/27/23 223 lb 3.2  oz (101.2 kg)      GENERAL:  No apparent distress, AOx3 HEENT:  No carotid bruits, +2 carotid impulses, no scleral icterus CAR: RRR no murmurs, gallops, rubs, or thrills RES:  Clear to auscultation bilaterally ABD:  Soft, nontender, nondistended, positive bowel sounds x 4 VASC:  +2 radial pulses, +2 carotid pulses NEURO:  CN 2-12 grossly intact; motor and sensory grossly intact PSYCH:  No active depression or anxiety EXT:  No edema, ecchymosis, or cyanosis  Signed, Angelynn Lemus K Johncarlos Holtsclaw, MD  01/28/2024 8:22 AM    [facility] [address] [place], [place], [place]  [address] Phone: [phone]; Fax: [phone]   Note:  This document was prepared using Dragon voice recognition software and may include unintentional dictation errors.

## 2024-01-24 DIAGNOSIS — H52223 Regular astigmatism, bilateral: Secondary | ICD-10-CM | POA: Diagnosis not present

## 2024-01-24 DIAGNOSIS — E119 Type 2 diabetes mellitus without complications: Secondary | ICD-10-CM | POA: Diagnosis not present

## 2024-01-24 DIAGNOSIS — H5213 Myopia, bilateral: Secondary | ICD-10-CM | POA: Diagnosis not present

## 2024-01-24 DIAGNOSIS — H524 Presbyopia: Secondary | ICD-10-CM | POA: Diagnosis not present

## 2024-01-28 ENCOUNTER — Encounter: Payer: Self-pay | Admitting: Internal Medicine

## 2024-01-28 ENCOUNTER — Ambulatory Visit: Attending: Internal Medicine | Admitting: Internal Medicine

## 2024-01-28 VITALS — BP 126/78 | HR 79 | Ht 64.0 in | Wt 221.0 lb

## 2024-01-28 DIAGNOSIS — E1159 Type 2 diabetes mellitus with other circulatory complications: Secondary | ICD-10-CM | POA: Diagnosis not present

## 2024-01-28 DIAGNOSIS — N183 Chronic kidney disease, stage 3 unspecified: Secondary | ICD-10-CM

## 2024-01-28 DIAGNOSIS — D6869 Other thrombophilia: Secondary | ICD-10-CM

## 2024-01-28 DIAGNOSIS — E1122 Type 2 diabetes mellitus with diabetic chronic kidney disease: Secondary | ICD-10-CM

## 2024-01-28 DIAGNOSIS — I480 Paroxysmal atrial fibrillation: Secondary | ICD-10-CM | POA: Diagnosis not present

## 2024-01-28 DIAGNOSIS — I152 Hypertension secondary to endocrine disorders: Secondary | ICD-10-CM

## 2024-01-28 DIAGNOSIS — E1169 Type 2 diabetes mellitus with other specified complication: Secondary | ICD-10-CM

## 2024-01-28 DIAGNOSIS — Z6841 Body Mass Index (BMI) 40.0 and over, adult: Secondary | ICD-10-CM

## 2024-01-28 DIAGNOSIS — E119 Type 2 diabetes mellitus without complications: Secondary | ICD-10-CM | POA: Diagnosis not present

## 2024-01-28 DIAGNOSIS — E785 Hyperlipidemia, unspecified: Secondary | ICD-10-CM

## 2024-01-28 MED ORDER — EMPAGLIFLOZIN 10 MG PO TABS: 10.0000 mg | ORAL_TABLET | Freq: Every day | ORAL | 3 refills | Status: AC

## 2024-01-28 MED ORDER — EZETIMIBE 10 MG PO TABS: 10.0000 mg | ORAL_TABLET | Freq: Every day | ORAL | 3 refills | Status: AC

## 2024-01-28 MED ORDER — TELMISARTAN 20 MG PO TABS: 20.0000 mg | ORAL_TABLET | Freq: Every day | ORAL | 3 refills | Status: AC

## 2024-01-28 NOTE — Patient Instructions (Signed)
 Medication Instructions:  Your physician recommends that you continue on your current medications as directed. Please refer to the Current Medication list given to you today.  *If you need a refill on your cardiac medications before your next appointment, please call your pharmacy*  Lab Work: None ordered  Testing/Procedures: None ordered  Follow-Up: At [facility], you and your health needs are our priority.  As part of our continuing mission to provide you with exceptional heart care, our providers are all part of one team.  This team includes your primary Cardiologist (physician) and Advanced Practice Providers or APPs (Physician Assistants and Nurse Practitioners) who all work together to provide you with the care you need, when you need it.  Your next appointment:   1 year(s)  Provider:   Glendia Ferrier, PA  Thank you for choosing [facility]!!   [phone]

## 2024-02-04 ENCOUNTER — Telehealth: Payer: Self-pay | Admitting: Internal Medicine

## 2024-02-04 MED ORDER — OZEMPIC (1 MG/DOSE) 4 MG/3ML ~~LOC~~ SOPN: 1.0000 mg | PEN_INJECTOR | SUBCUTANEOUS | 5 refills | Status: AC

## 2024-02-04 NOTE — Telephone Encounter (Signed)
 Pt c/o medication issue:  1. Name of Medication:   Semaglutide , 1 MG/DOSE, (OZEMPIC , 1 MG/DOSE,) 4 MG/3ML SOPN    2. How are you currently taking this medication (dosage and times per day)? As written   3. Are you having a reaction (difficulty breathing--STAT)? No   4. What is your medication issue? Pt states her insurance sent a letter stating her prescription needs to be reviewed prior to 02/21/24 to make sure it is still valid. Please advise.

## 2024-02-04 NOTE — Telephone Encounter (Signed)
 Rx sent to pharmacy

## 2024-02-07 ENCOUNTER — Ambulatory Visit: Admitting: Internal Medicine

## 2024-02-08 ENCOUNTER — Other Ambulatory Visit: Payer: Self-pay | Admitting: Obstetrics and Gynecology

## 2024-02-08 ENCOUNTER — Ambulatory Visit: Admitting: Obstetrics and Gynecology

## 2024-02-08 ENCOUNTER — Ambulatory Visit
Admission: RE | Admit: 2024-02-08 | Discharge: 2024-02-08 | Disposition: A | Source: Ambulatory Visit | Attending: Obstetrics and Gynecology

## 2024-02-08 VITALS — BP 110/78 | HR 84 | Ht 63.75 in | Wt 223.0 lb

## 2024-02-08 DIAGNOSIS — Z1331 Encounter for screening for depression: Secondary | ICD-10-CM

## 2024-02-08 DIAGNOSIS — E2839 Other primary ovarian failure: Secondary | ICD-10-CM | POA: Diagnosis not present

## 2024-02-08 DIAGNOSIS — Z9889 Other specified postprocedural states: Secondary | ICD-10-CM

## 2024-02-08 DIAGNOSIS — N951 Menopausal and female climacteric states: Secondary | ICD-10-CM

## 2024-02-08 DIAGNOSIS — G4733 Obstructive sleep apnea (adult) (pediatric): Secondary | ICD-10-CM | POA: Diagnosis not present

## 2024-02-08 DIAGNOSIS — Z01419 Encounter for gynecological examination (general) (routine) without abnormal findings: Secondary | ICD-10-CM | POA: Diagnosis not present

## 2024-02-08 DIAGNOSIS — Z1231 Encounter for screening mammogram for malignant neoplasm of breast: Secondary | ICD-10-CM | POA: Diagnosis not present

## 2024-02-08 DIAGNOSIS — D229 Melanocytic nevi, unspecified: Secondary | ICD-10-CM | POA: Diagnosis not present

## 2024-02-08 MED ORDER — ESTRADIOL 0.05 MG/24HR TD PTTW: 1.0000 | MEDICATED_PATCH | TRANSDERMAL | 12 refills | Status: AC
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
## 2024-02-08 NOTE — Patient Instructions (Signed)
 Perimenopause/Menopause suggestions   You should be getting 1,200 mg of calcium  a day between your diet and supplements and at least 3000 IU a day of Vit D. You should exercise regularly with weight bearing exercises. I would recommend a bone density q 2 years.  We loose 5% per year in this time period of our bone density, due to the loss of estrogen. Magnesium at bedtime for our sleep and bones (follow recommended dose on bottle)  Please read The New Menopause my Dr. Ronal Estefana Morton and Estrogen matters  Try to avoid caffeine and alcohol in this period, as they can make symptoms worse  Continue annual mammograms, unless told sooner  Please reach out with any questions Janet [name] Janet [name]

## 2024-02-15 ENCOUNTER — Ambulatory Visit: Payer: Self-pay | Admitting: Obstetrics and Gynecology

## 2024-02-15 ENCOUNTER — Ambulatory Visit: Admitting: Neurology

## 2024-02-19 ENCOUNTER — Encounter: Payer: Self-pay | Admitting: Obstetrics and Gynecology

## 2024-02-20 ENCOUNTER — Ambulatory Visit (HOSPITAL_BASED_OUTPATIENT_CLINIC_OR_DEPARTMENT_OTHER)
Admission: RE | Admit: 2024-02-20 | Discharge: 2024-02-20 | Disposition: A | Discharge status: Home / Self Care | Payer: Self-pay | Source: Ambulatory Visit | Attending: Obstetrics and Gynecology | Admitting: Obstetrics and Gynecology

## 2024-02-20 DIAGNOSIS — Z01419 Encounter for gynecological examination (general) (routine) without abnormal findings: Secondary | ICD-10-CM | POA: Insufficient documentation

## 2024-02-20 DIAGNOSIS — N951 Menopausal and female climacteric states: Secondary | ICD-10-CM | POA: Insufficient documentation

## 2024-02-25 ENCOUNTER — Encounter: Payer: Self-pay | Admitting: Pharmacist

## 2024-02-25 DIAGNOSIS — I2510 Atherosclerotic heart disease of native coronary artery without angina pectoris: Secondary | ICD-10-CM

## 2024-02-26 ENCOUNTER — Ambulatory Visit: Admitting: Neurology

## 2024-02-26 NOTE — Telephone Encounter (Signed)
Routing FYI.   Encounter closed.

## 2024-02-26 NOTE — Telephone Encounter (Signed)
 Cardiology referral cancelled.

## 2024-03-03 ENCOUNTER — Encounter: Payer: Self-pay | Admitting: Internal Medicine

## 2024-03-04 ENCOUNTER — Other Ambulatory Visit: Payer: Self-pay | Admitting: Obstetrics and Gynecology

## 2024-03-04 ENCOUNTER — Other Ambulatory Visit (HOSPITAL_COMMUNITY): Payer: Self-pay

## 2024-03-04 ENCOUNTER — Telehealth: Payer: Self-pay | Admitting: Pharmacy Technician

## 2024-03-04 NOTE — Telephone Encounter (Signed)
 Pharmacy Patient Advocate Encounter  Received notification from [employer] that Prior Authorization for Ozempic  has been APPROVED from 02/21/24 to 03/04/25. Ran test claim, Copay is $191.36-  one month. This test claim was processed through [facility]- copay amounts may vary at other pharmacies due to pharmacy/plan contracts, or as the patient moves through the different stages of their insurance plan.   PA #/Case ID/Reference #: [id number]

## 2024-03-04 NOTE — Telephone Encounter (Signed)
 Pharmacy Patient Advocate Encounter   Received notification from Pt Calls Messages that prior authorization for ozempic  is required/requested.   Insurance verification completed.   The patient is insured through [employer].   Per test claim: PA required; PA submitted to above mentioned insurance via Latent Key/confirmation #/EOC [id number] Status is pending

## 2024-04-14 ENCOUNTER — Ambulatory Visit (HOSPITAL_COMMUNITY): Payer: Self-pay

## 2024-04-24 ENCOUNTER — Ambulatory Visit (HOSPITAL_COMMUNITY): Payer: Self-pay | Admitting: Licensed Clinical Social Worker

## 2024-05-27 ENCOUNTER — Ambulatory Visit: Admitting: Neurology

## 2024-10-13 ENCOUNTER — Ambulatory Visit: Admitting: Physician Assistant

## 2025-02-23 ENCOUNTER — Ambulatory Visit: Admitting: Obstetrics and Gynecology
